# Patient Record
Sex: Male | Born: 1935 | Race: White | Hispanic: No | Smoking: Former smoker
Health system: Southern US, Community
[De-identification: ages and names within clinical notes are randomized; demographics above are authoritative.]

## PROBLEM LIST (undated history)

## (undated) DIAGNOSIS — Z8719 Personal history of other diseases of the digestive system: Secondary | ICD-10-CM

## (undated) DIAGNOSIS — K219 Gastro-esophageal reflux disease without esophagitis: Secondary | ICD-10-CM

## (undated) DIAGNOSIS — W19XXXA Unspecified fall, initial encounter: Secondary | ICD-10-CM

## (undated) DIAGNOSIS — E785 Hyperlipidemia, unspecified: Secondary | ICD-10-CM

## (undated) DIAGNOSIS — F32A Depression, unspecified: Secondary | ICD-10-CM

## (undated) DIAGNOSIS — M1990 Unspecified osteoarthritis, unspecified site: Secondary | ICD-10-CM

## (undated) DIAGNOSIS — S72001A Fracture of unspecified part of neck of right femur, initial encounter for closed fracture: Secondary | ICD-10-CM

## (undated) DIAGNOSIS — R251 Tremor, unspecified: Secondary | ICD-10-CM

## (undated) DIAGNOSIS — F419 Anxiety disorder, unspecified: Secondary | ICD-10-CM

## (undated) DIAGNOSIS — R569 Unspecified convulsions: Secondary | ICD-10-CM

## (undated) DIAGNOSIS — E875 Hyperkalemia: Secondary | ICD-10-CM

## (undated) DIAGNOSIS — I288 Other diseases of pulmonary vessels: Secondary | ICD-10-CM

## (undated) DIAGNOSIS — I2510 Atherosclerotic heart disease of native coronary artery without angina pectoris: Secondary | ICD-10-CM

## (undated) DIAGNOSIS — G8929 Other chronic pain: Secondary | ICD-10-CM

## (undated) DIAGNOSIS — Q268 Other congenital malformations of great veins: Secondary | ICD-10-CM

## (undated) DIAGNOSIS — R0601 Orthopnea: Secondary | ICD-10-CM

## (undated) DIAGNOSIS — D649 Anemia, unspecified: Secondary | ICD-10-CM

## (undated) DIAGNOSIS — M545 Low back pain, unspecified: Secondary | ICD-10-CM

## (undated) DIAGNOSIS — I209 Angina pectoris, unspecified: Secondary | ICD-10-CM

## (undated) DIAGNOSIS — I639 Cerebral infarction, unspecified: Secondary | ICD-10-CM

## (undated) DIAGNOSIS — Z8711 Personal history of peptic ulcer disease: Secondary | ICD-10-CM

## (undated) DIAGNOSIS — F329 Major depressive disorder, single episode, unspecified: Secondary | ICD-10-CM

## (undated) DIAGNOSIS — E119 Type 2 diabetes mellitus without complications: Secondary | ICD-10-CM

## (undated) DIAGNOSIS — I219 Acute myocardial infarction, unspecified: Secondary | ICD-10-CM

## (undated) DIAGNOSIS — I10 Essential (primary) hypertension: Secondary | ICD-10-CM

## (undated) DIAGNOSIS — J690 Pneumonitis due to inhalation of food and vomit: Secondary | ICD-10-CM

## (undated) DIAGNOSIS — N189 Chronic kidney disease, unspecified: Secondary | ICD-10-CM

## (undated) DIAGNOSIS — Y92009 Unspecified place in unspecified non-institutional (private) residence as the place of occurrence of the external cause: Secondary | ICD-10-CM

## (undated) HISTORY — DX: Gastro-esophageal reflux disease without esophagitis: K21.9

## (undated) HISTORY — DX: Atherosclerotic heart disease of native coronary artery without angina pectoris: I25.10

## (undated) HISTORY — DX: Tremor, unspecified: R25.1

## (undated) HISTORY — PX: INGUINAL HERNIA REPAIR: SUR1180

## (undated) HISTORY — DX: Essential (primary) hypertension: I10

## (undated) HISTORY — DX: Other diseases of pulmonary vessels: I28.8

## (undated) HISTORY — DX: Anxiety disorder, unspecified: F41.9

## (undated) HISTORY — DX: Other congenital malformations of great veins: Q26.8

## (undated) HISTORY — DX: Type 2 diabetes mellitus without complications: E11.9

## (undated) HISTORY — PX: CATARACT EXTRACTION W/ INTRAOCULAR LENS  IMPLANT, BILATERAL: SHX1307

## (undated) HISTORY — DX: Hyperlipidemia, unspecified: E78.5

---

## 2000-03-22 ENCOUNTER — Inpatient Hospital Stay (HOSPITAL_COMMUNITY): Admission: EM | Admit: 2000-03-22 | Discharge: 2000-03-25 | Payer: Self-pay | Admitting: Emergency Medicine

## 2000-03-22 ENCOUNTER — Encounter: Payer: Self-pay | Admitting: Emergency Medicine

## 2000-03-22 ENCOUNTER — Encounter: Payer: Self-pay | Admitting: Family Medicine

## 2000-03-23 ENCOUNTER — Encounter: Payer: Self-pay | Admitting: Family Medicine

## 2000-12-14 ENCOUNTER — Encounter: Payer: Self-pay | Admitting: Emergency Medicine

## 2000-12-14 ENCOUNTER — Emergency Department (HOSPITAL_COMMUNITY): Admission: EM | Admit: 2000-12-14 | Discharge: 2000-12-15 | Payer: Self-pay | Admitting: Emergency Medicine

## 2002-01-10 ENCOUNTER — Emergency Department (HOSPITAL_COMMUNITY): Admission: EM | Admit: 2002-01-10 | Discharge: 2002-01-10 | Payer: Self-pay | Admitting: Emergency Medicine

## 2002-01-10 ENCOUNTER — Inpatient Hospital Stay (HOSPITAL_COMMUNITY): Admission: EM | Admit: 2002-01-10 | Discharge: 2002-01-12 | Payer: Self-pay | Admitting: Emergency Medicine

## 2002-01-10 ENCOUNTER — Encounter: Payer: Self-pay | Admitting: Family Medicine

## 2002-09-02 ENCOUNTER — Inpatient Hospital Stay (HOSPITAL_COMMUNITY): Admission: EM | Admit: 2002-09-02 | Discharge: 2002-09-03 | Payer: Self-pay | Admitting: *Deleted

## 2002-09-02 ENCOUNTER — Encounter: Payer: Self-pay | Admitting: Emergency Medicine

## 2007-03-20 ENCOUNTER — Inpatient Hospital Stay (HOSPITAL_COMMUNITY): Admission: EM | Admit: 2007-03-20 | Discharge: 2007-03-22 | Payer: Self-pay | Admitting: Emergency Medicine

## 2008-04-09 ENCOUNTER — Inpatient Hospital Stay (HOSPITAL_COMMUNITY): Admission: EM | Admit: 2008-04-09 | Discharge: 2008-04-17 | Payer: Self-pay | Admitting: Emergency Medicine

## 2008-04-09 ENCOUNTER — Ambulatory Visit: Payer: Self-pay | Admitting: Critical Care Medicine

## 2008-04-09 ENCOUNTER — Ambulatory Visit: Payer: Self-pay | Admitting: Cardiology

## 2008-04-10 ENCOUNTER — Encounter: Payer: Self-pay | Admitting: Critical Care Medicine

## 2008-04-15 ENCOUNTER — Ambulatory Visit: Payer: Self-pay | Admitting: Cardiology

## 2008-04-15 ENCOUNTER — Ambulatory Visit: Payer: Self-pay | Admitting: Thoracic Surgery (Cardiothoracic Vascular Surgery)

## 2008-04-15 HISTORY — PX: CORONARY ARTERY BYPASS GRAFT: SHX141

## 2008-04-16 ENCOUNTER — Encounter: Payer: Self-pay | Admitting: Cardiology

## 2008-04-16 ENCOUNTER — Encounter: Payer: Self-pay | Admitting: Thoracic Surgery (Cardiothoracic Vascular Surgery)

## 2008-04-17 ENCOUNTER — Encounter: Payer: Self-pay | Admitting: Thoracic Surgery (Cardiothoracic Vascular Surgery)

## 2008-04-19 ENCOUNTER — Ambulatory Visit: Payer: Self-pay | Admitting: Cardiovascular Disease

## 2008-04-22 ENCOUNTER — Inpatient Hospital Stay (HOSPITAL_COMMUNITY)
Admission: RE | Admit: 2008-04-22 | Discharge: 2008-04-28 | Payer: Self-pay | Admitting: Thoracic Surgery (Cardiothoracic Vascular Surgery)

## 2008-04-26 ENCOUNTER — Encounter: Payer: Self-pay | Admitting: Thoracic Surgery (Cardiothoracic Vascular Surgery)

## 2008-05-11 DIAGNOSIS — E785 Hyperlipidemia, unspecified: Secondary | ICD-10-CM

## 2008-05-13 DIAGNOSIS — I10 Essential (primary) hypertension: Secondary | ICD-10-CM

## 2008-05-13 DIAGNOSIS — I25810 Atherosclerosis of coronary artery bypass graft(s) without angina pectoris: Secondary | ICD-10-CM | POA: Insufficient documentation

## 2008-05-13 DIAGNOSIS — R569 Unspecified convulsions: Secondary | ICD-10-CM

## 2008-05-13 DIAGNOSIS — E119 Type 2 diabetes mellitus without complications: Secondary | ICD-10-CM

## 2008-05-14 ENCOUNTER — Encounter: Payer: Self-pay | Admitting: Cardiology

## 2008-05-14 ENCOUNTER — Ambulatory Visit: Payer: Self-pay | Admitting: Cardiology

## 2008-05-24 ENCOUNTER — Ambulatory Visit: Payer: Self-pay | Admitting: Thoracic Surgery (Cardiothoracic Vascular Surgery)

## 2008-05-24 ENCOUNTER — Encounter
Admission: RE | Admit: 2008-05-24 | Discharge: 2008-05-24 | Payer: Self-pay | Admitting: Thoracic Surgery (Cardiothoracic Vascular Surgery)

## 2008-06-12 ENCOUNTER — Inpatient Hospital Stay (HOSPITAL_COMMUNITY): Admission: EM | Admit: 2008-06-12 | Discharge: 2008-06-14 | Payer: Self-pay | Admitting: Emergency Medicine

## 2008-07-03 ENCOUNTER — Ambulatory Visit: Payer: Self-pay | Admitting: Cardiology

## 2008-07-03 DIAGNOSIS — I119 Hypertensive heart disease without heart failure: Secondary | ICD-10-CM

## 2008-07-03 DIAGNOSIS — I2510 Atherosclerotic heart disease of native coronary artery without angina pectoris: Secondary | ICD-10-CM | POA: Insufficient documentation

## 2008-07-04 ENCOUNTER — Telehealth: Payer: Self-pay | Admitting: Cardiology

## 2008-07-08 LAB — CONVERTED CEMR LAB
ALT: 14 units/L (ref 0–53)
AST: 18 units/L (ref 0–37)
Albumin: 3.9 g/dL (ref 3.5–5.2)
Alkaline Phosphatase: 76 units/L (ref 39–117)
BUN: 12 mg/dL (ref 6–23)
Basophils Relative: 1.3 % (ref 0.0–3.0)
CO2: 28 meq/L (ref 19–32)
Chloride: 107 meq/L (ref 96–112)
Cholesterol: 161 mg/dL (ref 0–200)
Eosinophils Relative: 4.5 % (ref 0.0–5.0)
GFR calc non Af Amer: 86.74 mL/min (ref >60)
Glucose, Bld: 176 mg/dL — ABNORMAL HIGH (ref 70–99)
HCT: 35.4 % — ABNORMAL LOW (ref 39.0–52.0)
Hemoglobin: 12.1 g/dL — ABNORMAL LOW (ref 13.0–17.0)
Lymphs Abs: 3.1 10*3/uL (ref 0.7–4.0)
MCV: 94.2 fL (ref 78.0–100.0)
Monocytes Absolute: 0.5 10*3/uL (ref 0.1–1.0)
Monocytes Relative: 5.3 % (ref 3.0–12.0)
Potassium: 5.5 meq/L — ABNORMAL HIGH (ref 3.5–5.1)
RBC: 3.76 M/uL — ABNORMAL LOW (ref 4.22–5.81)
TSH: 1.91 microintl units/mL (ref 0.35–5.50)
Total Protein: 7 g/dL (ref 6.0–8.3)
WBC: 8.7 10*3/uL (ref 4.5–10.5)

## 2008-07-11 ENCOUNTER — Emergency Department (HOSPITAL_COMMUNITY): Admission: EM | Admit: 2008-07-11 | Discharge: 2008-07-11 | Payer: Self-pay | Admitting: Emergency Medicine

## 2008-07-11 ENCOUNTER — Ambulatory Visit: Payer: Self-pay | Admitting: Cardiology

## 2008-07-11 DIAGNOSIS — E876 Hypokalemia: Secondary | ICD-10-CM

## 2008-07-11 LAB — CONVERTED CEMR LAB
BUN: 21 mg/dL (ref 6–23)
CO2: 25 meq/L (ref 19–32)
Calcium: 9 mg/dL (ref 8.4–10.5)
GFR calc non Af Amer: 86.74 mL/min (ref >60)
Glucose, Bld: 234 mg/dL — ABNORMAL HIGH (ref 70–99)
Potassium: 6 meq/L (ref 3.5–5.1)
Sodium: 141 meq/L (ref 135–145)

## 2008-07-12 ENCOUNTER — Telehealth: Payer: Self-pay | Admitting: Cardiology

## 2008-08-18 ENCOUNTER — Emergency Department (HOSPITAL_COMMUNITY): Admission: EM | Admit: 2008-08-18 | Discharge: 2008-08-18 | Payer: Self-pay | Admitting: Emergency Medicine

## 2009-05-13 ENCOUNTER — Ambulatory Visit: Payer: Self-pay | Admitting: Cardiology

## 2009-05-13 DIAGNOSIS — R279 Unspecified lack of coordination: Secondary | ICD-10-CM | POA: Insufficient documentation

## 2009-05-13 DIAGNOSIS — R269 Unspecified abnormalities of gait and mobility: Secondary | ICD-10-CM | POA: Insufficient documentation

## 2009-05-13 LAB — CONVERTED CEMR LAB
BUN: 18 mg/dL (ref 6–23)
Basophils Absolute: 0.1 10*3/uL (ref 0.0–0.1)
Bilirubin, Direct: 0 mg/dL (ref 0.0–0.3)
Chloride: 105 meq/L (ref 96–112)
Cholesterol: 161 mg/dL (ref 0–200)
Creatinine, Ser: 1.2 mg/dL (ref 0.4–1.5)
Eosinophils Absolute: 0.5 10*3/uL (ref 0.0–0.7)
Glucose, Bld: 205 mg/dL — ABNORMAL HIGH (ref 70–99)
HCT: 32.5 % — ABNORMAL LOW (ref 39.0–52.0)
HDL: 60.2 mg/dL (ref >39.00)
LDL Cholesterol: 71 mg/dL (ref 0–99)
Lymphs Abs: 2.2 10*3/uL (ref 0.7–4.0)
MCV: 97.7 fL (ref 78.0–100.0)
Monocytes Absolute: 0.5 10*3/uL (ref 0.1–1.0)
Neutrophils Relative %: 64.2 % (ref 43.0–77.0)
Platelets: 251 10*3/uL (ref 150.0–400.0)
Potassium: 5.8 meq/L — ABNORMAL HIGH (ref 3.5–5.1)
RDW: 11.9 % (ref 11.5–14.6)
Total Bilirubin: 0.4 mg/dL (ref 0.3–1.2)
Total CHOL/HDL Ratio: 3
Triglycerides: 150 mg/dL — ABNORMAL HIGH (ref 0.0–149.0)
VLDL: 30 mg/dL (ref 0.0–40.0)
WBC: 8.9 10*3/uL (ref 4.5–10.5)

## 2009-05-15 ENCOUNTER — Telehealth: Payer: Self-pay | Admitting: Cardiology

## 2009-05-23 ENCOUNTER — Ambulatory Visit: Payer: Self-pay | Admitting: Cardiology

## 2009-05-23 ENCOUNTER — Telehealth: Payer: Self-pay | Admitting: Cardiology

## 2009-05-23 LAB — CONVERTED CEMR LAB
BUN: 20 mg/dL (ref 6–23)
CO2: 26 meq/L (ref 19–32)
Calcium: 9.1 mg/dL (ref 8.4–10.5)
Creatinine, Ser: 1.2 mg/dL (ref 0.4–1.5)
Glucose, Bld: 187 mg/dL — ABNORMAL HIGH (ref 70–99)
Sodium: 140 meq/L (ref 135–145)

## 2009-05-26 ENCOUNTER — Ambulatory Visit: Payer: Self-pay | Admitting: Cardiology

## 2009-05-27 ENCOUNTER — Encounter: Payer: Self-pay | Admitting: Cardiology

## 2009-05-27 LAB — CONVERTED CEMR LAB
BUN: 17 mg/dL (ref 6–23)
Calcium: 8.7 mg/dL (ref 8.4–10.5)
Chloride: 103 meq/L (ref 96–112)
Creatinine, Ser: 1.2 mg/dL (ref 0.4–1.5)

## 2009-06-03 ENCOUNTER — Inpatient Hospital Stay (HOSPITAL_COMMUNITY): Admission: EM | Admit: 2009-06-03 | Discharge: 2009-06-05 | Payer: Self-pay | Admitting: Emergency Medicine

## 2009-06-27 ENCOUNTER — Encounter: Admission: RE | Admit: 2009-06-27 | Discharge: 2009-06-27 | Payer: Self-pay | Admitting: Neurology

## 2009-06-28 ENCOUNTER — Observation Stay (HOSPITAL_COMMUNITY): Admission: EM | Admit: 2009-06-28 | Discharge: 2009-06-29 | Payer: Self-pay | Admitting: Emergency Medicine

## 2009-06-28 ENCOUNTER — Ambulatory Visit: Payer: Self-pay | Admitting: Internal Medicine

## 2009-07-04 ENCOUNTER — Encounter: Payer: Self-pay | Admitting: Cardiology

## 2009-07-16 ENCOUNTER — Encounter: Payer: Self-pay | Admitting: Cardiology

## 2010-03-17 NOTE — Progress Notes (Signed)
Summary: Med clarification  Phone Note Outgoing Call   Call placed by: Sherri Rad, RN, BSN,  May 23, 2009 5:38 PM Summary of Call: I called the pt's pharmacy - [facility] on [address]. They do not stock Kayexalate. I was told to contact [facility] pharmacy at [facility]. I called and spoke with Janie- I called in the Kayexalate 50 grams x 1 dose. I called the pt's daughter back and she states that is too far for them to drive since they live in [place]. She did also tell me they had Kionex left over from last year that was [facility] [place]. [facility] [place] to inquire if this is equivalant to Kayexalate. Per the pharmacist, this is the same thing. I explained the pt would need to take 50 grams x 1 dose and I needed to know what that equated to in mL's. Per the pharmacist, the pt will need to take 213mL's to make a 50 gram dose. I have relayed this to the pt's daughter and she verbalizes understanding. I have also called [facility] and cancelled the RX that I have called in there.  Initial call taken by: Sherri Rad, RN, BSN,  May 23, 2009 5:43 PM

## 2010-03-17 NOTE — Assessment & Plan Note (Signed)
Summary: bp check  Nurse Visit   Vital Signs:  Patient profile:   77 year old male Weight:      157.75 pounds Pulse rate:   58 / minute Resp:     20 per minute BP sitting:   132 / 60  (left arm)  Vitals Entered By: Ollen Gross, RN, BSN (May 23, 2009 11:08 AM)  Impression & Recommendations:  Problem # 1:  BEN HTN HEART DISEASE WITHOUT HEART FAIL (ICD-402.10)  His updated medication list for this problem includes:    Amlodipine Besylate 10 Mg Tabs (Amlodipine besylate) .Marland Kitchen... Take one tablet by mouth daily    Aspirin Ec 325 Mg Tbec (Aspirin) .Marland Kitchen... Take one tablet by mouth daily    Carvedilol 25 Mg Tabs (Carvedilol) .Marland Kitchen... Take one tablet by mouth twice a day    Lisinopril 10 Mg Tabs (Lisinopril) .Marland Kitchen... Take one tablet by mouth daily    Hydralazine Hcl 25 Mg Tabs (Hydralazine hcl) .Marland Kitchen... Take one tablet by mouth three times a day Pt. in for B/P check and labs (repeat BMET) B/P left arm 132/60 pulse 58 bpm. right arm 135/61. RR 20 Pt. has no C/O. He took his medications this AM. Pt would like to know if he can take Hydralazine HCT 25mg  one tablet in the morning and two tablets in the evening instead of one tablet three times a day. Patient states he forgets to take the one at noon time. I will send message to  Dr. Juanda Chance . MD's nurse will let you know asap. Okay with pt.    Allergies: No Known Drug Allergies

## 2010-03-17 NOTE — Miscellaneous (Signed)
Summary: med update  Clinical Lists Changes  Medications: Removed medication of LISINOPRIL 10 MG TABS (LISINOPRIL) Take one tablet by mouth daily Changed medication from HYDRALAZINE HCL 25 MG TABS (HYDRALAZINE HCL) Take one tablet by mouth three times a day to HYDRALAZINE HCL 50 MG TABS (HYDRALAZINE HCL) Take one tablet by mouth three times a day - Signed Rx of HYDRALAZINE HCL 50 MG TABS (HYDRALAZINE HCL) Take one tablet by mouth three times a day;  #90 x 6;  Signed;  Entered by: Sherri Rad, RN, BSN;  Authorized by: Lenoria Farrier, MD, [facility];  Method used: Electronically to [facility]  [phone]*, [address] [place], [place], [place]  [address], Ph: [phone] or [phone], Fax: [phone]    Prescriptions: HYDRALAZINE HCL 50 MG TABS (HYDRALAZINE HCL) Take one tablet by mouth three times a day  #90 x 6   Entered by:   Sherri Rad, RN, BSN   Authorized by:   Lenoria Farrier, MD, [facility]   Signed by:   Sherri Rad, RN, BSN on 05/27/2009   Method used:   Electronically to        [facility]  [phone]* (retail)       [address]       [place], [place]  [address]       Ph: [phone] or [phone]       Fax: [phone]   RxID:   [id number]

## 2010-03-17 NOTE — Assessment & Plan Note (Signed)
Summary: 1 YR F/U   Visit Type:  1 yr f/u Primary Provider:  Benedetto Goad  CC:  sob...pt states bilateral leg numbness from the knees down...denies any cp .  History of Present Illness: The patient is 77 years old and return for management of CAD. In March of 2010 he presented with a seizure and chest pain and was found to have three-vessel coronary disease and underwent bypass surgery.  His ejection fraction was 60%.   He has done quite well since that time from the standpoint of his heart and has had no chest pain shortness breath or palpitations.  He was hospitalized later in 2010 with Dilantin toxicity and this dose was reduced.  His other major problem is he has developed trouble with walking over the past 6 months. He now has to use a cane and feels that he is very unsteady.  His other problems include hypertension, hyperlipidemia, and diabetes.  Current Medications (verified): 1)  Amlodipine Besylate 10 Mg Tabs (Amlodipine Besylate) .... Take One Tablet By Mouth Daily 2)  Aspirin Ec 325 Mg Tbec (Aspirin) .... Take One Tablet By Mouth Daily 3)  Carvedilol 25 Mg Tabs (Carvedilol) .... Take One Tablet By Mouth Twice A Day 4)  Lisinopril 40 Mg Tabs (Lisinopril) .... Take One Half Tablet By Mouth Daily 5)  Simvastatin 40 Mg Tabs (Simvastatin) .... Take One Tablet By Mouth Daily At Bedtime 6)  Diazepam 10 Mg Tabs (Diazepam) .... Three Times A Day As Needed 7)  Epitol 200 Mg Tabs (Carbamazepine) .... 2 Two Times A Day 8)  Glyburide 5 Mg  Tabs (Glyburide) .... 2  Tablet By Mouth Two Times A Day 9)  Metformin Hcl 1000 Mg Tabs (Metformin Hcl) .... Take 1 Tablet By Mouth Two Times A Day 10)  Pantoprazole Sodium 40 Mg Tbec (Pantoprazole Sodium) .... Once Daily 11)  Dilantin 100 Mg Caps (Phenytoin Sodium Extended) .Marland Kitchen.. 1 Tab Three Times A Day 12)  Vitamin B-12 1000 Mcg Tabs (Cyanocobalamin) .Marland Kitchen.. 1 Tab Once Daily  Allergies (verified): No Known Drug Allergies  Past History:  Past  Medical History: Reviewed history from 05/14/2008 and no changes required. CAD S/P CABG 3/10 1.  Seizure disorder.   2. Diabetes mellitus type 2.   3. Hypertension.   4. Hyperlipidemia.   5. Coronary artery disease status post MI at age 74.   50. Anxiety disorder.   7. Tremor   8. GERD.       Pulmonary Vein Stenosis  Review of Systems       ROS is negative except as outlined in HPI.   Vital Signs:  Patient profile:   77 year old male Height:      61 inches Weight:      160 pounds BMI:     30.34 Pulse rate:   62 / minute Pulse rhythm:   irregular BP sitting:   140 / 70  (left arm) Cuff size:   regular  Vitals Entered By: Danielle Rankin, CMA (May 13, 2009 10:09 AM)  Physical Exam  Additional Exam:  Gen. Well-nourished, in no distress   Neck: No JVD, thyroid not enlarged, no carotid bruits Lungs: No tachypnea, clear without rales, rhonchi or wheezes Cardiovascular: Rhythm regular, PMI not displaced,  heart sounds  normal, no murmurs or gallops, no peripheral edema, pulses normal in all 4 extremities. Abdomen: BS normal, abdomen soft and non-tender without masses or organomegaly, no hepatosplenomegaly. MS: No deformities, no cyanosis or clubbing   Neuro:  he  has a wide-based gait Skin:  no lesions    Impression & Recommendations:  Problem # 1:  CORONARY ATHEROSCLEROSIS NATIVE CORONARY ARTERY (ICD-414.01)  He had bypass surgery in March of 2010. He's had no recent chest pain this problem appears stable. We will continue current medications. His updated medication list for this problem includes:    Amlodipine Besylate 10 Mg Tabs (Amlodipine besylate) .Marland Kitchen... Take one tablet by mouth daily    Aspirin Ec 325 Mg Tbec (Aspirin) .Marland Kitchen... Take one tablet by mouth daily    Carvedilol 25 Mg Tabs (Carvedilol) .Marland Kitchen... Take one tablet by mouth twice a day    Amlodipine Besylate 5 Mg Tabs (Amlodipine besylate) .Marland Kitchen... Take one tablet by mouth daily  His updated medication list for this  problem includes:    Amlodipine Besylate 10 Mg Tabs (Amlodipine besylate) .Marland Kitchen... Take one tablet by mouth daily    Aspirin Ec 325 Mg Tbec (Aspirin) .Marland Kitchen... Take one tablet by mouth daily    Carvedilol 25 Mg Tabs (Carvedilol) .Marland Kitchen... Take one tablet by mouth twice a day    Lisinopril 40 Mg Tabs (Lisinopril) .Marland Kitchen... Take one half tablet by mouth daily  Problem # 2:  HYPERTENSION, BENIGN (ICD-401.1)  This appears well controlled on current medications. His updated medication list for this problem includes:    Amlodipine Besylate 10 Mg Tabs (Amlodipine besylate) .Marland Kitchen... Take one tablet by mouth daily    Aspirin Ec 325 Mg Tbec (Aspirin) .Marland Kitchen... Take one tablet by mouth daily    Carvedilol 25 Mg Tabs (Carvedilol) .Marland Kitchen... Take one tablet by mouth twice a day    Amlodipine Besylate 5 Mg Tabs (Amlodipine besylate) .Marland Kitchen... Take one tablet by mouth daily  Orders: EKG w/ Interpretation (93000) TLB-BMP (Basic Metabolic Panel-BMET) (80048-METABOL) TLB-CBC Platelet - w/Differential (85025-CBCD) TLB-Hepatic/Liver Function Pnl (80076-HEPATIC) TLB-Lipid Panel (80061-LIPID) TLB-A1C / Hgb A1C (Glycohemoglobin) (83036-A1C)  His updated medication list for this problem includes:    Amlodipine Besylate 10 Mg Tabs (Amlodipine besylate) .Marland Kitchen... Take one tablet by mouth daily    Aspirin Ec 325 Mg Tbec (Aspirin) .Marland Kitchen... Take one tablet by mouth daily    Carvedilol 25 Mg Tabs (Carvedilol) .Marland Kitchen... Take one tablet by mouth twice a day    Lisinopril 40 Mg Tabs (Lisinopril) .Marland Kitchen... Take one half tablet by mouth daily  Problem # 3:  HYPERLIPIDEMIA-MIXED (ICD-272.4) He is currently being treated with simvastatin. We will get a lipid profile today. His updated medication list for this problem includes:    Simvastatin 40 Mg Tabs (Simvastatin) .Marland Kitchen... Take one tablet by mouth daily at bedtime  Orders: EKG w/ Interpretation (93000) TLB-BMP (Basic Metabolic Panel-BMET) (80048-METABOL) TLB-CBC Platelet - w/Differential  (85025-CBCD) TLB-Hepatic/Liver Function Pnl (80076-HEPATIC) TLB-Lipid Panel (80061-LIPID) TLB-A1C / Hgb A1C (Glycohemoglobin) (83036-A1C)  His updated medication list for this problem includes:    Simvastatin 40 Mg Tabs (Simvastatin) .Marland Kitchen... Take one tablet by mouth daily at bedtime  Problem # 4:  ATAXIA (ICD-781.3) He has developed progressive ataxia over the past 6 months with difficulty walking and has to use a cane. He also has a seizure disorder. We will check a Dilantin level. We will make a referral to neurology for further evaluation of the ataxia.  Other Orders: Neurology Referral (Neuro) TLB-Phenytoin (Dilantin) (80185-DILAN)  Patient Instructions: 1)  Your physician recommends that you have lab work today: lipid/liver/cbc/bmp/dilantin level/HgbA1c (414.01;272.2;402.10) 2)  Your physician wants you to follow-up in: 1 year with Dr. Clifton James.  You will receive a reminder letter in the mail two months  in advance. If you don't receive a letter, please call our office to schedule the follow-up appointment. 3)  You will be referred to Neurology. Prescriptions: AMLODIPINE BESYLATE 5 MG TABS (AMLODIPINE BESYLATE) Take one tablet by mouth daily  #30 x 6   Entered by:   Sherri Rad, RN, BSN   Authorized by:   Lenoria Farrier, MD, [facility]   Signed by:   Sherri Rad, RN, BSN on 05/13/2009   Method used:   Electronically to        [facility]  [phone]* (retail)       [address]       [place], [place]  [address]       Ph: [phone] or [phone]       Fax: [phone]   RxID:   [id number]

## 2010-03-17 NOTE — Progress Notes (Signed)
Summary: questions about new meds  Phone Note Call from Patient Call back at Home Phone [phone]   Caller: PT Reason for Call: Talk to Nurse Summary of Call: pt wants to go over new meds with nurse Initial call taken by: Faythe Ghee,  May 15, 2009 4:01 PM  Follow-up for Phone Call        Spoke with pt's daughter Dois Davenport. She states pt's Lisinopril was D/C and put on Norvasc 5 mg daily on 05/13/09 due to high potassium. Pt's daughter states pt.has been taken Norvasc 10mg  daily and she has taken the 10 mg pill today. She would like to know Does pt. needs to be on Norvasc 5mg  or 10 mg.? I let daughter know Dr. Juanda Chance and nurse will be in office tomorrow. I will send this message to their descktop. Dois Davenport would like to be call to her Cell phone # [phone]. Ollen Gross, RN, BSN  May 15, 2009 4:48 PM   Additional Follow-up for Phone Call Additional follow up Details #1::        I discussed the above with Dr. Juanda Chance. Orders received to have the pt resume amlodipine at 10mg  once daily , restart lisinopril at 10mg  once daily, and start apresoline 25mg  three times a day. I have spoken with the pt's daughter, Dois Davenport, and she verbalizes understanding of these orders. The pt will come next friday 05/23/09 for BP check and repeat bmet.  Additional Follow-up by: Sherri Rad, RN, BSN,  May 16, 2009 6:50 PM    New/Updated Medications: LISINOPRIL 10 MG TABS (LISINOPRIL) Take one tablet by mouth daily HYDRALAZINE HCL 25 MG TABS (HYDRALAZINE HCL) Take one tablet by mouth three times a day Prescriptions: HYDRALAZINE HCL 25 MG TABS (HYDRALAZINE HCL) Take one tablet by mouth three times a day  #90 x 6   Entered by:   Sherri Rad, RN, BSN   Authorized by:   Lenoria Farrier, MD, [facility]   Signed by:   Sherri Rad, RN, BSN on 05/16/2009   Method used:   Electronically to        [facility]  [phone]* (retail)       [address]       [place], [place]  [address]       Ph: [phone] or [phone]       Fax: [phone]   RxID:   [id number] LISINOPRIL 10 MG TABS (LISINOPRIL) Take one tablet by mouth daily  #30 x 6   Entered by:   Sherri Rad, RN, BSN   Authorized by:   Lenoria Farrier, MD, [facility]   Signed by:   Sherri Rad, RN, BSN on 05/16/2009   Method used:   Electronically to        [facility]  [phone]* (retail)       [address]       [place], [place]  [address]       Ph: [phone] or [phone]       Fax: [phone]   RxID:   [id number]

## 2010-03-17 NOTE — Letter (Signed)
Summary: [facility]   [facility]   Imported By: Roderic Ovens 07/25/2009 11:52:48  _____________________________________________________________________  External Attachment:    Type:   Image     Comment:   External Document

## 2010-03-20 NOTE — Letter (Signed)
Summary: [facility] Office Notes  [facility] Office Notes   Imported By: Roderic Ovens 08/07/2009 09:37:41  _____________________________________________________________________  External Attachment:    Type:   Image     Comment:   External Document

## 2010-04-15 ENCOUNTER — Telehealth: Payer: Self-pay | Admitting: Cardiovascular Disease

## 2010-04-23 NOTE — Progress Notes (Signed)
Summary: chest discomfort  Phone Note Outgoing Call   Call placed by: Whitney Maeola Sarah RN,  April 15, 2010 9:33 AM Call placed to: Patient Initial call taken by: Ellender Hose RN,  April 15, 2010 9:33 AM  Follow-up for Phone Call        Patient had CABG in 2010 and is c/o chest soreness on both sides of his chest over his scar. This just started about a week ago. He does not c/o any other chest pain or dyspnea. I explained to him that this is probably just muscle soreness and to try applying heat to his chest. He is a former Psychologist, counselling patient and will f/u with Dr. Clifton James 05/13/10. I advised him to call us back if this continues or if he has any CP/SOB. He agrees. Will forward to Dr. Clifton James for review. Whitney Maeola Sarah RN  April 15, 2010 9:38 AM  Follow-up by: Whitney Maeola Sarah RN,  April 15, 2010 9:38 AM  Additional Follow-up for Phone Call Additional follow up Details #1::        Agree. cdm Additional Follow-up by: Verne Carrow, MD,  April 15, 2010 2:17 PM

## 2010-04-29 ENCOUNTER — Encounter: Payer: Self-pay | Admitting: Cardiovascular Disease

## 2010-05-04 LAB — BASIC METABOLIC PANEL
CO2: 20 mEq/L (ref 19–32)
Calcium: 8.3 mg/dL — ABNORMAL LOW (ref 8.4–10.5)
GFR calc Af Amer: >60 mL/min (ref >60)
GFR calc non Af Amer: 54 mL/min — ABNORMAL LOW (ref >60)
Potassium: 4.8 mEq/L (ref 3.5–5.1)
Sodium: 134 mEq/L — ABNORMAL LOW (ref 135–145)

## 2010-05-04 LAB — CARDIAC PANEL(CRET KIN+CKTOT+MB+TROPI)
Relative Index: INVALID (ref 0.0–2.5)
Troponin I: 0.02 ng/mL (ref 0.00–0.06)
Troponin I: 0.02 ng/mL (ref 0.00–0.06)

## 2010-05-04 LAB — GLUCOSE, CAPILLARY: Glucose-Capillary: 88 mg/dL (ref 70–99)

## 2010-05-05 LAB — GLUCOSE, CAPILLARY
Glucose-Capillary: 174 mg/dL — ABNORMAL HIGH (ref 70–99)
Glucose-Capillary: 266 mg/dL — ABNORMAL HIGH (ref 70–99)
Glucose-Capillary: 291 mg/dL — ABNORMAL HIGH (ref 70–99)

## 2010-05-05 LAB — CBC
HCT: 34.1 % — ABNORMAL LOW (ref 39.0–52.0)
HCT: 28.2 % — ABNORMAL LOW (ref 39.0–52.0)
Hemoglobin: 9.8 g/dL — ABNORMAL LOW (ref 13.0–17.0)
MCHC: 34.9 g/dL (ref 30.0–36.0)
MCHC: 34.9 g/dL (ref 30.0–36.0)
MCV: 94 fL (ref 78.0–100.0)
MCV: 93.2 fL (ref 78.0–100.0)
MCV: 95.2 fL (ref 78.0–100.0)
Platelets: 231 10*3/uL (ref 150–400)
Platelets: 276 10*3/uL (ref 150–400)
Platelets: 357 10*3/uL (ref 150–400)
Platelets: 366 10*3/uL (ref 150–400)
RBC: 3.49 MIL/uL — ABNORMAL LOW (ref 4.22–5.81)
RDW: 13 % (ref 11.5–15.5)
RDW: 12.3 % (ref 11.5–15.5)
RDW: 12.4 % (ref 11.5–15.5)
WBC: 11.4 10*3/uL — ABNORMAL HIGH (ref 4.0–10.5)
WBC: 11.7 10*3/uL — ABNORMAL HIGH (ref 4.0–10.5)

## 2010-05-05 LAB — DIFFERENTIAL
Basophils Absolute: 0.1 10*3/uL (ref 0.0–0.1)
Basophils Relative: 0 % (ref 0–1)
Eosinophils Absolute: 0.1 10*3/uL (ref 0.0–0.7)
Lymphocytes Relative: 36 % (ref 12–46)
Monocytes Absolute: 0.4 10*3/uL (ref 0.1–1.0)
Monocytes Relative: 6 % (ref 3–12)
Neutro Abs: 4 10*3/uL (ref 1.7–7.7)
Neutrophils Relative %: 81 % — ABNORMAL HIGH (ref 43–77)

## 2010-05-05 LAB — URINE CULTURE
Colony Count: NO GROWTH
Culture: NO GROWTH

## 2010-05-05 LAB — BASIC METABOLIC PANEL
BUN: 18 mg/dL (ref 6–23)
CO2: 25 mEq/L (ref 19–32)
Chloride: 105 mEq/L (ref 96–112)
GFR calc non Af Amer: >60 mL/min (ref >60)
Glucose, Bld: 205 mg/dL — ABNORMAL HIGH (ref 70–99)
Potassium: 4.1 mEq/L (ref 3.5–5.1)
Sodium: 136 mEq/L (ref 135–145)

## 2010-05-05 LAB — CK TOTAL AND CKMB (NOT AT ARMC)
CK, MB: 2 ng/mL (ref 0.3–4.0)
Relative Index: INVALID (ref 0.0–2.5)
Relative Index: INVALID (ref 0.0–2.5)
Total CK: 38 U/L (ref 7–232)
Total CK: 78 U/L (ref 7–232)

## 2010-05-05 LAB — LIPID PANEL
HDL: 54 mg/dL (ref >39)
Total CHOL/HDL Ratio: 3 RATIO
Triglycerides: 131 mg/dL (ref <150)

## 2010-05-05 LAB — CARDIAC PANEL(CRET KIN+CKTOT+MB+TROPI)
CK, MB: 0.8 ng/mL (ref 0.3–4.0)
CK, MB: 2.5 ng/mL (ref 0.3–4.0)
Relative Index: INVALID (ref 0.0–2.5)
Relative Index: INVALID (ref 0.0–2.5)
Total CK: 69 U/L (ref 7–232)
Troponin I: 0.33 ng/mL — ABNORMAL HIGH (ref 0.00–0.06)

## 2010-05-05 LAB — COMPREHENSIVE METABOLIC PANEL
AST: 56 U/L — ABNORMAL HIGH (ref 0–37)
Albumin: 3.8 g/dL (ref 3.5–5.2)
Albumin: 3.2 g/dL — ABNORMAL LOW (ref 3.5–5.2)
BUN: 47 mg/dL — ABNORMAL HIGH (ref 6–23)
BUN: 5 mg/dL — ABNORMAL LOW (ref 6–23)
CO2: 24 mEq/L (ref 19–32)
Calcium: 8.8 mg/dL (ref 8.4–10.5)
Chloride: 108 mEq/L (ref 96–112)
Creatinine, Ser: 1.74 mg/dL — ABNORMAL HIGH (ref 0.4–1.5)
Creatinine, Ser: 1.16 mg/dL (ref 0.4–1.5)
GFR calc Af Amer: 46 mL/min — ABNORMAL LOW (ref >60)
GFR calc Af Amer: >60 mL/min (ref >60)
GFR calc non Af Amer: >60 mL/min (ref >60)
Potassium: 5.2 mEq/L — ABNORMAL HIGH (ref 3.5–5.1)
Total Bilirubin: 0.2 mg/dL — ABNORMAL LOW (ref 0.3–1.2)
Total Protein: 6.9 g/dL (ref 6.0–8.3)

## 2010-05-05 LAB — URINE MICROSCOPIC-ADD ON

## 2010-05-05 LAB — POCT I-STAT, CHEM 8
Calcium, Ion: 1.13 mmol/L (ref 1.12–1.32)
Chloride: 105 mEq/L (ref 96–112)
HCT: 33 % — ABNORMAL LOW (ref 39.0–52.0)
Potassium: 4.5 mEq/L (ref 3.5–5.1)

## 2010-05-05 LAB — TSH: TSH: 1.83 u[IU]/mL (ref 0.350–4.500)

## 2010-05-05 LAB — LEGIONELLA ANTIGEN, URINE

## 2010-05-05 LAB — TROPONIN I: Troponin I: 0.33 ng/mL — ABNORMAL HIGH (ref 0.00–0.06)

## 2010-05-05 LAB — PHENYTOIN LEVEL, TOTAL: Phenytoin Lvl: 7.5 ug/mL — ABNORMAL LOW (ref 10.0–20.0)

## 2010-05-05 LAB — CARBAMAZEPINE LEVEL, TOTAL
Carbamazepine Lvl: 6.7 ug/mL (ref 4.0–12.0)
Carbamazepine Lvl: 3.4 ug/mL — ABNORMAL LOW (ref 4.0–12.0)

## 2010-05-05 LAB — URINALYSIS, ROUTINE W REFLEX MICROSCOPIC
Bilirubin Urine: NEGATIVE
Nitrite: NEGATIVE
Specific Gravity, Urine: 1.012 (ref 1.005–1.030)
Urobilinogen, UA: 0.2 mg/dL (ref 0.0–1.0)

## 2010-05-05 LAB — CULTURE, BLOOD (ROUTINE X 2): Culture: NO GROWTH

## 2010-05-05 LAB — PROTIME-INR: INR: 1.07 (ref 0.00–1.49)

## 2010-05-13 ENCOUNTER — Encounter: Payer: Self-pay | Admitting: Cardiovascular Disease

## 2010-05-13 ENCOUNTER — Ambulatory Visit (INDEPENDENT_AMBULATORY_CARE_PROVIDER_SITE_OTHER): Payer: Medicare Other | Admitting: Cardiovascular Disease

## 2010-05-13 DIAGNOSIS — R0989 Other specified symptoms and signs involving the circulatory and respiratory systems: Secondary | ICD-10-CM

## 2010-05-13 DIAGNOSIS — I10 Essential (primary) hypertension: Secondary | ICD-10-CM

## 2010-05-13 DIAGNOSIS — R0600 Dyspnea, unspecified: Secondary | ICD-10-CM

## 2010-05-13 DIAGNOSIS — I2510 Atherosclerotic heart disease of native coronary artery without angina pectoris: Secondary | ICD-10-CM

## 2010-05-13 NOTE — Assessment & Plan Note (Signed)
Stable. His chest pain that he describes is aytpical, non-exertional and most likely musculoskeletal, related to his sternal wound. No changes in therapy.

## 2010-05-13 NOTE — Assessment & Plan Note (Signed)
BP is not at goal. He is on multiple medications. I will have him begin checking his BP at home daily and record a diary. He will let us know what it is running at home and we will make adjustments in therapy.

## 2010-05-13 NOTE — Patient Instructions (Signed)
Your physician recommends that you schedule a follow-up appointment in: 6 months with Dr. McAlhany  Your physician has requested that you have an echocardiogram. Echocardiography is a painless test that uses sound waves to create images of your heart. It provides your doctor with information about the size and shape of your heart and how well your heart's chambers and valves are working. This procedure takes approximately one hour. There are no restrictions for this procedure.   

## 2010-05-13 NOTE — Progress Notes (Signed)
History of Present Illness:77 yo WM with history of CAD s/p 3V March 2010 (RIMA to RCA, SVG to Diagonal, SVG to OM1)  , HTN, hyperlipidemia, DM here today for cardiac follow up. He has been folllowed in the past by Dr. Juanda Chance.  In March of 2010 he presented with a seizure and chest pain and was found to have three-vessel coronary disease and underwent bypass surgery.  His ejection fraction was 60%. He was hospitalized later in 2010 with Dilantin toxicity and this dose was reduced.   He has been doing well. He does describe shortness of breath with exertion. No exertional chest pain or pressure. He also denies any dizziness, near syncope or syncope.    Past Medical History  Diagnosis Date  . CAD (coronary artery disease)     s/p cabg 3/10...s/p MI age 55  . Seizure disorder   . Diabetes mellitus type II   . HTN (hypertension)   . HLD (hyperlipidemia)   . Anxiety disorder   . Tremor   . GERD (gastroesophageal reflux disease)   . Pulmonary vein stenosis     Past Surgical History  Procedure Date  . Coronary artery bypass graft 3/10    Current Outpatient Prescriptions  Medication Sig Dispense Refill  . amLODipine (NORVASC) 10 MG tablet Take 10 mg by mouth daily.        Marland Kitchen aspirin 325 MG tablet Take 325 mg by mouth daily.        . carvedilol (COREG) 25 MG tablet Take 25 mg by mouth 2 (two) times daily with a meal.        . diazepam (VALIUM) 10 MG tablet Take 10 mg by mouth 3 (three) times daily as needed.        . glyBURIDE (DIABETA) 5 MG tablet Take 10 mg by mouth 2 (two) times daily with a meal.        . hydrALAZINE (APRESOLINE) 50 MG tablet Take 50 mg by mouth 3 (three) times daily.        . metFORMIN (GLUCOPHAGE) 1000 MG tablet Take 1,000 mg by mouth 2 (two) times daily with a meal.        . pantoprazole (PROTONIX) 40 MG tablet Take 40 mg by mouth daily.        . phenytoin (DILANTIN) 100 MG ER capsule Take 100 mg by mouth 3 (three) times daily.        . simvastatin (ZOCOR) 40 MG  tablet Take 40 mg by mouth at bedtime.        . vitamin B-12 (CYANOCOBALAMIN) 1000 MCG tablet Take 1,000 mcg by mouth daily.        Marland Kitchen DISCONTD: carbamazepine (TEGRETOL) 200 MG tablet Take 200 mg by mouth 2 (two) times daily.          Allergies not on file  History   Social History  . Marital Status: Married    Spouse Name: N/A    Number of Children: N/A  . Years of Education: N/A   Occupational History  . retired     Engineer, maintenance   Social History Main Topics  . Smoking status: Never Smoker   . Smokeless tobacco: Former Neurosurgeon    Types: Chew  . Alcohol Use: 0.0 oz/week     remote alcohol history but has not drank any for many years.  . Drug Use: Not on file  . Sexually Active: Not on file   Other Topics Concern  . Not on  file   Social History Narrative  . No narrative on file    Family History  Problem Relation Age of Onset  . Coronary artery disease    . Hypertension      Review of Systems:  No chest pain, SOB, palpitations, dizziness,  near syncope or syncope.  No PND, orthopnea, or Lower extremity edema.   BP 154/60  Pulse 57  Resp 18  Ht 5\' 11"  (1.803 m)  Wt 163 lb (73.936 kg)  BMI 22.73 kg/m2  Physical Examination: General: Well developed, well nourished, NAD HEENT: OP clear, mucus membranes moist SKIN: warm, dry. No rashes. Neuro: No focal deficits Musculoskeletal: Muscle strength 5/5 all ext Psychiatric: Mood and affect normal Neck: No JVD, no carotid bruits, no thyromegaly, no lymphadenopathy. Lungs:Clear bilaterally, no wheezes, rhonci, crackles Cardiovascular: Regular rate and rhythm. No murmurs, gallops or rubs. Abdomen:Soft. Bowel sounds present. Non-tender.  Extremities: No lower extremity edema. Pulses are 2 + in the bilateral DP/PT.  ZOX:WRUEA brady, rate 57 bpm. 1st degree AV block. Incomplete RBBB.

## 2010-05-13 NOTE — Assessment & Plan Note (Signed)
Will arrange echo to assess LV function.

## 2010-05-20 ENCOUNTER — Other Ambulatory Visit (HOSPITAL_COMMUNITY): Payer: Medicare Other | Admitting: Radiology

## 2010-05-24 LAB — DIFFERENTIAL
Basophils Relative: 0 % (ref 0–1)
Eosinophils Absolute: 0.5 10*3/uL (ref 0.0–0.7)
Eosinophils Relative: 5 % (ref 0–5)
Lymphs Abs: 2.5 10*3/uL (ref 0.7–4.0)

## 2010-05-24 LAB — URINALYSIS, ROUTINE W REFLEX MICROSCOPIC
Glucose, UA: NEGATIVE mg/dL
Hgb urine dipstick: NEGATIVE
Ketones, ur: NEGATIVE mg/dL
Protein, ur: NEGATIVE mg/dL
Urobilinogen, UA: 0.2 mg/dL (ref 0.0–1.0)

## 2010-05-24 LAB — URINE CULTURE: Culture: NO GROWTH

## 2010-05-24 LAB — CBC
HCT: 34.5 % — ABNORMAL LOW (ref 39.0–52.0)
MCHC: 33.7 g/dL (ref 30.0–36.0)
MCV: 96.6 fL (ref 78.0–100.0)
Platelets: 209 10*3/uL (ref 150–400)
WBC: 8.9 10*3/uL (ref 4.0–10.5)

## 2010-05-24 LAB — POCT CARDIAC MARKERS: Myoglobin, poc: 186 ng/mL (ref 12–200)

## 2010-05-24 LAB — BASIC METABOLIC PANEL
CO2: 21 mEq/L (ref 19–32)
Calcium: 8.7 mg/dL (ref 8.4–10.5)
GFR calc Af Amer: >60 mL/min (ref >60)
GFR calc non Af Amer: >60 mL/min (ref >60)
Glucose, Bld: 171 mg/dL — ABNORMAL HIGH (ref 70–99)
Potassium: 6.6 mEq/L (ref 3.5–5.1)
Sodium: 137 mEq/L (ref 135–145)

## 2010-05-24 LAB — POCT I-STAT, CHEM 8
Chloride: 115 mEq/L — ABNORMAL HIGH (ref 96–112)
HCT: 35 % — ABNORMAL LOW (ref 39.0–52.0)
Potassium: 6.5 mEq/L (ref 3.5–5.1)

## 2010-05-26 LAB — COMPREHENSIVE METABOLIC PANEL
ALT: 13 U/L (ref 0–53)
Albumin: 3.7 g/dL (ref 3.5–5.2)
Alkaline Phosphatase: 75 U/L (ref 39–117)
BUN: 19 mg/dL (ref 6–23)
Chloride: 109 mEq/L (ref 96–112)
Potassium: 4.9 mEq/L (ref 3.5–5.1)
Sodium: 140 mEq/L (ref 135–145)
Total Bilirubin: 0.9 mg/dL (ref 0.3–1.2)
Total Protein: 6.9 g/dL (ref 6.0–8.3)

## 2010-05-27 LAB — DIFFERENTIAL
Basophils Absolute: 0 10*3/uL (ref 0.0–0.1)
Eosinophils Absolute: 0.3 10*3/uL (ref 0.0–0.7)
Lymphocytes Relative: 27 % (ref 12–46)
Lymphocytes Relative: 39 % (ref 12–46)
Lymphs Abs: 2.6 10*3/uL (ref 0.7–4.0)
Monocytes Relative: 7 % (ref 3–12)
Neutro Abs: 3.1 10*3/uL (ref 1.7–7.7)
Neutro Abs: 6.2 10*3/uL (ref 1.7–7.7)
Neutrophils Relative %: 50 % (ref 43–77)
Neutrophils Relative %: 63 % (ref 43–77)

## 2010-05-27 LAB — GLUCOSE, CAPILLARY
Glucose-Capillary: 108 mg/dL — ABNORMAL HIGH (ref 70–99)
Glucose-Capillary: 132 mg/dL — ABNORMAL HIGH (ref 70–99)
Glucose-Capillary: 146 mg/dL — ABNORMAL HIGH (ref 70–99)
Glucose-Capillary: 183 mg/dL — ABNORMAL HIGH (ref 70–99)
Glucose-Capillary: 226 mg/dL — ABNORMAL HIGH (ref 70–99)

## 2010-05-27 LAB — URINALYSIS, ROUTINE W REFLEX MICROSCOPIC
Hgb urine dipstick: NEGATIVE
Nitrite: NEGATIVE
Specific Gravity, Urine: 1.027 (ref 1.005–1.030)
Urobilinogen, UA: 0.2 mg/dL (ref 0.0–1.0)
pH: 5 (ref 5.0–8.0)

## 2010-05-27 LAB — COMPREHENSIVE METABOLIC PANEL
ALT: 16 U/L (ref 0–53)
BUN: 15 mg/dL (ref 6–23)
CO2: 22 mEq/L (ref 19–32)
Calcium: 8.8 mg/dL (ref 8.4–10.5)
Creatinine, Ser: 1 mg/dL (ref 0.4–1.5)
GFR calc non Af Amer: >60 mL/min (ref >60)
Glucose, Bld: 243 mg/dL — ABNORMAL HIGH (ref 70–99)
Total Protein: 6.9 g/dL (ref 6.0–8.3)

## 2010-05-27 LAB — CBC
MCHC: 34.3 g/dL (ref 30.0–36.0)
MCV: 93.3 fL (ref 78.0–100.0)
Platelets: 237 10*3/uL (ref 150–400)
Platelets: 336 10*3/uL (ref 150–400)
RBC: 3.58 MIL/uL — ABNORMAL LOW (ref 4.22–5.81)
RDW: 13.4 % (ref 11.5–15.5)
RDW: 13.8 % (ref 11.5–15.5)
WBC: 6.3 10*3/uL (ref 4.0–10.5)

## 2010-05-27 LAB — URINE MICROSCOPIC-ADD ON

## 2010-05-27 LAB — CARBAMAZEPINE LEVEL, TOTAL: Carbamazepine Lvl: 5.2 ug/mL (ref 4.0–12.0)

## 2010-05-27 LAB — PROTIME-INR
INR: 1.1 (ref 0.00–1.49)
Prothrombin Time: 14 seconds (ref 11.6–15.2)

## 2010-05-27 LAB — CK TOTAL AND CKMB (NOT AT ARMC)
Relative Index: INVALID (ref 0.0–2.5)
Total CK: 36 U/L (ref 7–232)

## 2010-05-27 LAB — URINE CULTURE

## 2010-05-27 LAB — APTT: aPTT: 29 seconds (ref 24–37)

## 2010-05-27 LAB — PHENYTOIN LEVEL, TOTAL
Phenytoin Lvl: 18.5 ug/mL (ref 10.0–20.0)
Phenytoin Lvl: 23.9 ug/mL — ABNORMAL HIGH (ref 10.0–20.0)

## 2010-05-28 LAB — GLUCOSE, CAPILLARY
Glucose-Capillary: 119 mg/dL — ABNORMAL HIGH (ref 70–99)
Glucose-Capillary: 121 mg/dL — ABNORMAL HIGH (ref 70–99)
Glucose-Capillary: 123 mg/dL — ABNORMAL HIGH (ref 70–99)
Glucose-Capillary: 126 mg/dL — ABNORMAL HIGH (ref 70–99)
Glucose-Capillary: 144 mg/dL — ABNORMAL HIGH (ref 70–99)
Glucose-Capillary: 146 mg/dL — ABNORMAL HIGH (ref 70–99)
Glucose-Capillary: 156 mg/dL — ABNORMAL HIGH (ref 70–99)
Glucose-Capillary: 159 mg/dL — ABNORMAL HIGH (ref 70–99)
Glucose-Capillary: 159 mg/dL — ABNORMAL HIGH (ref 70–99)
Glucose-Capillary: 177 mg/dL — ABNORMAL HIGH (ref 70–99)
Glucose-Capillary: 177 mg/dL — ABNORMAL HIGH (ref 70–99)
Glucose-Capillary: 179 mg/dL — ABNORMAL HIGH (ref 70–99)
Glucose-Capillary: 183 mg/dL — ABNORMAL HIGH (ref 70–99)
Glucose-Capillary: 189 mg/dL — ABNORMAL HIGH (ref 70–99)
Glucose-Capillary: 213 mg/dL — ABNORMAL HIGH (ref 70–99)
Glucose-Capillary: 219 mg/dL — ABNORMAL HIGH (ref 70–99)
Glucose-Capillary: 245 mg/dL — ABNORMAL HIGH (ref 70–99)
Glucose-Capillary: 267 mg/dL — ABNORMAL HIGH (ref 70–99)
Glucose-Capillary: 77 mg/dL (ref 70–99)
Glucose-Capillary: 83 mg/dL (ref 70–99)
Glucose-Capillary: 214 mg/dL — ABNORMAL HIGH (ref 70–99)

## 2010-05-28 LAB — URINALYSIS, ROUTINE W REFLEX MICROSCOPIC
Leukocytes, UA: NEGATIVE
Nitrite: NEGATIVE
Specific Gravity, Urine: 1.036 — ABNORMAL HIGH (ref 1.005–1.030)
Urobilinogen, UA: 1 mg/dL (ref 0.0–1.0)
pH: 5 (ref 5.0–8.0)

## 2010-05-28 LAB — BASIC METABOLIC PANEL
CO2: 22 mEq/L (ref 19–32)
CO2: 24 mEq/L (ref 19–32)
Calcium: 7.9 mg/dL — ABNORMAL LOW (ref 8.4–10.5)
Calcium: 8 mg/dL — ABNORMAL LOW (ref 8.4–10.5)
Calcium: 8.1 mg/dL — ABNORMAL LOW (ref 8.4–10.5)
Chloride: 100 mEq/L (ref 96–112)
Chloride: 107 mEq/L (ref 96–112)
Chloride: 107 mEq/L (ref 96–112)
GFR calc Af Amer: >60 mL/min (ref >60)
GFR calc Af Amer: >60 mL/min (ref >60)
GFR calc Af Amer: >60 mL/min (ref >60)
GFR calc non Af Amer: >60 mL/min (ref >60)
GFR calc non Af Amer: >60 mL/min (ref >60)
Glucose, Bld: 142 mg/dL — ABNORMAL HIGH (ref 70–99)
Glucose, Bld: 179 mg/dL — ABNORMAL HIGH (ref 70–99)
Potassium: 3.4 mEq/L — ABNORMAL LOW (ref 3.5–5.1)
Potassium: 4.3 mEq/L (ref 3.5–5.1)
Potassium: 4.4 mEq/L (ref 3.5–5.1)
Sodium: 133 mEq/L — ABNORMAL LOW (ref 135–145)
Sodium: 135 mEq/L (ref 135–145)
Sodium: 137 mEq/L (ref 135–145)
Sodium: 138 mEq/L (ref 135–145)

## 2010-05-28 LAB — POCT I-STAT 7, (LYTES, BLD GAS, ICA,H+H)
Acid-base deficit: 3 mmol/L — ABNORMAL HIGH (ref 0.0–2.0)
Bicarbonate: 22.3 mEq/L (ref 20.0–24.0)
Calcium, Ion: 0.99 mmol/L — ABNORMAL LOW (ref 1.12–1.32)
O2 Saturation: 99 %
pO2, Arterial: 124 mmHg — ABNORMAL HIGH (ref 80.0–100.0)

## 2010-05-28 LAB — CBC
HCT: 29.9 % — ABNORMAL LOW (ref 39.0–52.0)
HCT: 30.3 % — ABNORMAL LOW (ref 39.0–52.0)
HCT: 30.5 % — ABNORMAL LOW (ref 39.0–52.0)
HCT: 32.3 % — ABNORMAL LOW (ref 39.0–52.0)
HCT: 32.8 % — ABNORMAL LOW (ref 39.0–52.0)
Hemoglobin: 10.2 g/dL — ABNORMAL LOW (ref 13.0–17.0)
Hemoglobin: 10.5 g/dL — ABNORMAL LOW (ref 13.0–17.0)
Hemoglobin: 10.5 g/dL — ABNORMAL LOW (ref 13.0–17.0)
Hemoglobin: 10.7 g/dL — ABNORMAL LOW (ref 13.0–17.0)
Hemoglobin: 12.2 g/dL — ABNORMAL LOW (ref 13.0–17.0)
MCHC: 34.6 g/dL (ref 30.0–36.0)
MCHC: 34.7 g/dL (ref 30.0–36.0)
MCHC: 34.7 g/dL (ref 30.0–36.0)
MCHC: 35 g/dL (ref 30.0–36.0)
MCV: 93 fL (ref 78.0–100.0)
MCV: 93 fL (ref 78.0–100.0)
MCV: 93.1 fL (ref 78.0–100.0)
MCV: 93.2 fL (ref 78.0–100.0)
MCV: 93.7 fL (ref 78.0–100.0)
RBC: 3.22 MIL/uL — ABNORMAL LOW (ref 4.22–5.81)
RBC: 3.31 MIL/uL — ABNORMAL LOW (ref 4.22–5.81)
RBC: 3.44 MIL/uL — ABNORMAL LOW (ref 4.22–5.81)
RBC: 3.52 MIL/uL — ABNORMAL LOW (ref 4.22–5.81)
RBC: 3.8 MIL/uL — ABNORMAL LOW (ref 4.22–5.81)
RDW: 12.9 % (ref 11.5–15.5)
RDW: 13.4 % (ref 11.5–15.5)
WBC: 11.3 10*3/uL — ABNORMAL HIGH (ref 4.0–10.5)
WBC: 15.2 10*3/uL — ABNORMAL HIGH (ref 4.0–10.5)
WBC: 7.9 10*3/uL (ref 4.0–10.5)
WBC: 8.3 10*3/uL (ref 4.0–10.5)

## 2010-05-28 LAB — COMPREHENSIVE METABOLIC PANEL
ALT: 14 U/L (ref 0–53)
AST: 13 U/L (ref 0–37)
CO2: 24 mEq/L (ref 19–32)
Calcium: 7.7 mg/dL — ABNORMAL LOW (ref 8.4–10.5)
Chloride: 104 mEq/L (ref 96–112)
GFR calc Af Amer: >60 mL/min (ref >60)
GFR calc non Af Amer: >60 mL/min (ref >60)
Glucose, Bld: 167 mg/dL — ABNORMAL HIGH (ref 70–99)
Sodium: 140 mEq/L (ref 135–145)
Total Bilirubin: 0.9 mg/dL (ref 0.3–1.2)

## 2010-05-28 LAB — POCT I-STAT 4, (NA,K, GLUC, HGB,HCT)
Glucose, Bld: 180 mg/dL — ABNORMAL HIGH (ref 70–99)
Glucose, Bld: 252 mg/dL — ABNORMAL HIGH (ref 70–99)
HCT: 15 % — ABNORMAL LOW (ref 39.0–52.0)
HCT: 20 % — ABNORMAL LOW (ref 39.0–52.0)
HCT: 23 % — ABNORMAL LOW (ref 39.0–52.0)
HCT: 34 % — ABNORMAL LOW (ref 39.0–52.0)
Hemoglobin: 10.2 g/dL — ABNORMAL LOW (ref 13.0–17.0)
Hemoglobin: 5.1 g/dL — CL (ref 13.0–17.0)
Hemoglobin: 6.8 g/dL — CL (ref 13.0–17.0)
Potassium: 3.8 mEq/L (ref 3.5–5.1)
Potassium: 4.4 mEq/L (ref 3.5–5.1)
Potassium: 4.7 mEq/L (ref 3.5–5.1)
Sodium: 132 mEq/L — ABNORMAL LOW (ref 135–145)
Sodium: 136 mEq/L (ref 135–145)
Sodium: 136 mEq/L (ref 135–145)
Sodium: 139 mEq/L (ref 135–145)

## 2010-05-28 LAB — MAGNESIUM: Magnesium: 2.4 mg/dL (ref 1.5–2.5)

## 2010-05-28 LAB — POCT I-STAT 3, ART BLOOD GAS (G3+)
Acid-base deficit: 2 mmol/L (ref 0.0–2.0)
Acid-base deficit: 2 mmol/L (ref 0.0–2.0)
Bicarbonate: 21.4 mEq/L (ref 20.0–24.0)
O2 Saturation: 100 %
TCO2: 22 mmol/L (ref 0–100)
pCO2 arterial: 36.7 mmHg (ref 35.0–45.0)
pCO2 arterial: 40.7 mmHg (ref 35.0–45.0)
pH, Arterial: 7.364 (ref 7.350–7.450)
pH, Arterial: 7.375 (ref 7.350–7.450)
pO2, Arterial: 100 mmHg (ref 80.0–100.0)
pO2, Arterial: 204 mmHg — ABNORMAL HIGH (ref 80.0–100.0)

## 2010-05-28 LAB — HEMOGLOBIN A1C
Hgb A1c MFr Bld: 6.2 % — ABNORMAL HIGH (ref 4.6–6.1)
Mean Plasma Glucose: 137 mg/dL

## 2010-05-28 LAB — BLOOD GAS, ARTERIAL
Acid-Base Excess: 0.2 mmol/L (ref 0.0–2.0)
Bicarbonate: 23.6 mEq/L (ref 20.0–24.0)
O2 Saturation: 95.2 %
Patient temperature: 98.6
TCO2: 24.6 mmol/L (ref 0–100)
pO2, Arterial: 73.4 mmHg — ABNORMAL LOW (ref 80.0–100.0)

## 2010-05-28 LAB — POCT I-STAT GLUCOSE
Glucose, Bld: 157 mg/dL — ABNORMAL HIGH (ref 70–99)
Operator id: 3406

## 2010-05-28 LAB — URINE MICROSCOPIC-ADD ON

## 2010-05-28 LAB — TYPE AND SCREEN: ABO/RH(D): AB POS

## 2010-05-28 LAB — LIPID PANEL
Cholesterol: 100 mg/dL (ref 0–200)
LDL Cholesterol: 47 mg/dL (ref 0–99)

## 2010-05-28 LAB — APTT: aPTT: 26 seconds (ref 24–37)

## 2010-05-28 LAB — POCT I-STAT, CHEM 8
BUN: 7 mg/dL (ref 6–23)
Chloride: 105 mEq/L (ref 96–112)
HCT: 32 % — ABNORMAL LOW (ref 39.0–52.0)
Sodium: 136 mEq/L (ref 135–145)
TCO2: 17 mmol/L (ref 0–100)

## 2010-05-28 LAB — ABO/RH: ABO/RH(D): AB POS

## 2010-05-28 LAB — CREATININE, SERUM
GFR calc Af Amer: >60 mL/min (ref >60)
GFR calc non Af Amer: >60 mL/min (ref >60)

## 2010-05-28 LAB — PROTIME-INR
INR: 1.3 (ref 0.00–1.49)
INR: 2 — ABNORMAL HIGH (ref 0.00–1.49)

## 2010-06-02 LAB — CARBOXYHEMOGLOBIN
Carboxyhemoglobin: 1.3 % (ref 0.5–1.5)
Carboxyhemoglobin: 1.4 % (ref 0.5–1.5)
Carboxyhemoglobin: 1.4 % (ref 0.5–1.5)
Carboxyhemoglobin: 1.4 % (ref 0.5–1.5)
Carboxyhemoglobin: 1.6 % — ABNORMAL HIGH (ref 0.5–1.5)
Carboxyhemoglobin: 1.6 % — ABNORMAL HIGH (ref 0.5–1.5)
Carboxyhemoglobin: 1.7 % — ABNORMAL HIGH (ref 0.5–1.5)
Methemoglobin: 1.5 % (ref 0.0–1.5)
Methemoglobin: 1.6 % — ABNORMAL HIGH (ref 0.0–1.5)
Methemoglobin: 1.7 % — ABNORMAL HIGH (ref 0.0–1.5)
Methemoglobin: 1.8 % — ABNORMAL HIGH (ref 0.0–1.5)
Methemoglobin: 1.8 % — ABNORMAL HIGH (ref 0.0–1.5)
Methemoglobin: 1.8 % — ABNORMAL HIGH (ref 0.0–1.5)
Methemoglobin: 1.9 % — ABNORMAL HIGH (ref 0.0–1.5)
Methemoglobin: 2 % — ABNORMAL HIGH (ref 0.0–1.5)
O2 Saturation: 63.3 %
O2 Saturation: 63.4 %
O2 Saturation: 66.1 %
O2 Saturation: 70.1 %
O2 Saturation: 70.3 %
O2 Saturation: 74.4 %
O2 Saturation: 79 %
Total hemoglobin: 10 g/dL — ABNORMAL LOW (ref 13.5–18.0)
Total hemoglobin: 7.8 g/dL — CL (ref 13.5–18.0)
Total hemoglobin: 7.9 g/dL — CL (ref 13.5–18.0)
Total hemoglobin: 8.1 g/dL — ABNORMAL LOW (ref 13.5–18.0)
Total hemoglobin: 8.9 g/dL — ABNORMAL LOW (ref 13.5–18.0)
Total hemoglobin: 9 g/dL — ABNORMAL LOW (ref 13.5–18.0)
Total hemoglobin: 9.4 g/dL — ABNORMAL LOW (ref 13.5–18.0)

## 2010-06-02 LAB — BASIC METABOLIC PANEL
BUN: 21 mg/dL (ref 6–23)
BUN: 22 mg/dL (ref 6–23)
BUN: 22 mg/dL (ref 6–23)
BUN: 23 mg/dL (ref 6–23)
CO2: 18 mEq/L — ABNORMAL LOW (ref 19–32)
CO2: 18 mEq/L — ABNORMAL LOW (ref 19–32)
CO2: 20 mEq/L (ref 19–32)
CO2: 21 mEq/L (ref 19–32)
Calcium: 6.8 mg/dL — ABNORMAL LOW (ref 8.4–10.5)
Calcium: 7 mg/dL — ABNORMAL LOW (ref 8.4–10.5)
Calcium: 7.2 mg/dL — ABNORMAL LOW (ref 8.4–10.5)
Calcium: 8.1 mg/dL — ABNORMAL LOW (ref 8.4–10.5)
Chloride: 109 mEq/L (ref 96–112)
Chloride: 110 mEq/L (ref 96–112)
Chloride: 111 mEq/L (ref 96–112)
Chloride: 112 mEq/L (ref 96–112)
Chloride: 112 mEq/L (ref 96–112)
Creatinine, Ser: 0.91 mg/dL (ref 0.4–1.5)
Creatinine, Ser: 1.26 mg/dL (ref 0.4–1.5)
Creatinine, Ser: 1.31 mg/dL (ref 0.4–1.5)
Creatinine, Ser: 1.42 mg/dL (ref 0.4–1.5)
Creatinine, Ser: 1.47 mg/dL (ref 0.4–1.5)
GFR calc Af Amer: 56 mL/min — ABNORMAL LOW (ref >60)
GFR calc Af Amer: >60 mL/min (ref >60)
GFR calc Af Amer: >60 mL/min (ref >60)
GFR calc Af Amer: >60 mL/min (ref >60)
GFR calc Af Amer: >60 mL/min (ref >60)
GFR calc non Af Amer: 38 mL/min — ABNORMAL LOW (ref >60)
GFR calc non Af Amer: 53 mL/min — ABNORMAL LOW (ref >60)
GFR calc non Af Amer: 57 mL/min — ABNORMAL LOW (ref >60)
GFR calc non Af Amer: >60 mL/min (ref >60)
Glucose, Bld: 137 mg/dL — ABNORMAL HIGH (ref 70–99)
Glucose, Bld: 162 mg/dL — ABNORMAL HIGH (ref 70–99)
Glucose, Bld: 167 mg/dL — ABNORMAL HIGH (ref 70–99)
Potassium: 3 mEq/L — ABNORMAL LOW (ref 3.5–5.1)
Potassium: 3.9 mEq/L (ref 3.5–5.1)
Potassium: 4.1 mEq/L (ref 3.5–5.1)
Potassium: 5.7 mEq/L — ABNORMAL HIGH (ref 3.5–5.1)
Sodium: 135 mEq/L (ref 135–145)
Sodium: 137 mEq/L (ref 135–145)
Sodium: 138 mEq/L (ref 135–145)
Sodium: 141 mEq/L (ref 135–145)
Sodium: 141 mEq/L (ref 135–145)

## 2010-06-02 LAB — GLUCOSE, CAPILLARY
Glucose-Capillary: 104 mg/dL — ABNORMAL HIGH (ref 70–99)
Glucose-Capillary: 114 mg/dL — ABNORMAL HIGH (ref 70–99)
Glucose-Capillary: 123 mg/dL — ABNORMAL HIGH (ref 70–99)
Glucose-Capillary: 131 mg/dL — ABNORMAL HIGH (ref 70–99)
Glucose-Capillary: 141 mg/dL — ABNORMAL HIGH (ref 70–99)
Glucose-Capillary: 144 mg/dL — ABNORMAL HIGH (ref 70–99)
Glucose-Capillary: 149 mg/dL — ABNORMAL HIGH (ref 70–99)
Glucose-Capillary: 156 mg/dL — ABNORMAL HIGH (ref 70–99)
Glucose-Capillary: 158 mg/dL — ABNORMAL HIGH (ref 70–99)
Glucose-Capillary: 185 mg/dL — ABNORMAL HIGH (ref 70–99)
Glucose-Capillary: 221 mg/dL — ABNORMAL HIGH (ref 70–99)
Glucose-Capillary: 237 mg/dL — ABNORMAL HIGH (ref 70–99)
Glucose-Capillary: 255 mg/dL — ABNORMAL HIGH (ref 70–99)
Glucose-Capillary: 60 mg/dL — ABNORMAL LOW (ref 70–99)
Glucose-Capillary: >600 mg/dL (ref 70–99)

## 2010-06-02 LAB — BLOOD GAS, ARTERIAL
Acid-base deficit: 5.1 mmol/L — ABNORMAL HIGH (ref 0.0–2.0)
Acid-base deficit: 5.9 mmol/L — ABNORMAL HIGH (ref 0.0–2.0)
Acid-base deficit: 8.2 mmol/L — ABNORMAL HIGH (ref 0.0–2.0)
Acid-base deficit: 9.7 mmol/L — ABNORMAL HIGH (ref 0.0–2.0)
Bicarbonate: 16.2 mEq/L — ABNORMAL LOW (ref 20.0–24.0)
Bicarbonate: 16.5 mEq/L — ABNORMAL LOW (ref 20.0–24.0)
Bicarbonate: 16.8 mEq/L — ABNORMAL LOW (ref 20.0–24.0)
Bicarbonate: 17.7 mEq/L — ABNORMAL LOW (ref 20.0–24.0)
Drawn by: 129801
Drawn by: 129801
Drawn by: 129801
Drawn by: 232811
FIO2: 0.1 %
FIO2: 0.4 %
FIO2: 0.4 %
FIO2: 0.5 %
MECHVT: 440 mL
MECHVT: 500 mL
MECHVT: 600 mL
MECHVT: 600 mL
O2 Saturation: 97.8 %
O2 Saturation: 98 %
O2 Saturation: 98.5 %
O2 Saturation: 99.5 %
PEEP: 5 cmH2O
PEEP: 5 cmH2O
PEEP: 5 cmH2O
PEEP: 5 cmH2O
Patient temperature: 98.6
Patient temperature: 98.6
Patient temperature: 98.6
Patient temperature: 98.6
Patient temperature: 98.6
RATE: 18 resp/min
RATE: 20 resp/min
RATE: 20 resp/min
RATE: 24 resp/min
TCO2: 14.9 mmol/L (ref 0–100)
TCO2: 15.8 mmol/L (ref 0–100)
TCO2: 16.4 mmol/L (ref 0–100)
pCO2 arterial: 32.2 mmHg — ABNORMAL LOW (ref 35.0–45.0)
pCO2 arterial: 35.3 mmHg (ref 35.0–45.0)
pCO2 arterial: 46 mmHg — ABNORMAL HIGH (ref 35.0–45.0)
pH, Arterial: 7.329 — ABNORMAL LOW (ref 7.350–7.450)
pH, Arterial: 7.364 (ref 7.350–7.450)
pO2, Arterial: 105 mmHg — ABNORMAL HIGH (ref 80.0–100.0)
pO2, Arterial: 105 mmHg — ABNORMAL HIGH (ref 80.0–100.0)
pO2, Arterial: 108 mmHg — ABNORMAL HIGH (ref 80.0–100.0)
pO2, Arterial: 154 mmHg — ABNORMAL HIGH (ref 80.0–100.0)
pO2, Arterial: 99.7 mmHg (ref 80.0–100.0)

## 2010-06-02 LAB — TYPE AND SCREEN
ABO/RH(D): AB POS
Antibody Screen: NEGATIVE

## 2010-06-02 LAB — CARDIAC PANEL(CRET KIN+CKTOT+MB+TROPI)
CK, MB: 11.3 ng/mL — ABNORMAL HIGH (ref 0.3–4.0)
CK, MB: 11.5 ng/mL — ABNORMAL HIGH (ref 0.3–4.0)
CK, MB: 7.4 ng/mL — ABNORMAL HIGH (ref 0.3–4.0)
Relative Index: 1.2 (ref 0.0–2.5)
Relative Index: 1.8 (ref 0.0–2.5)
Total CK: 1683 U/L — ABNORMAL HIGH (ref 7–232)
Total CK: 645 U/L — ABNORMAL HIGH (ref 7–232)
Total CK: 726 U/L — ABNORMAL HIGH (ref 7–232)
Troponin I: 0.64 ng/mL (ref 0.00–0.06)
Troponin I: 0.76 ng/mL (ref 0.00–0.06)

## 2010-06-02 LAB — COMPREHENSIVE METABOLIC PANEL
ALT: 21 U/L (ref 0–53)
AST: 33 U/L (ref 0–37)
Albumin: 3.9 g/dL (ref 3.5–5.2)
Alkaline Phosphatase: 53 U/L (ref 39–117)
BUN: 22 mg/dL (ref 6–23)
CO2: 22 mEq/L (ref 19–32)
Calcium: 8.8 mg/dL (ref 8.4–10.5)
Chloride: 117 mEq/L — ABNORMAL HIGH (ref 96–112)
GFR calc Af Amer: >60 mL/min (ref >60)
GFR calc non Af Amer: 40 mL/min — ABNORMAL LOW (ref >60)
GFR calc non Af Amer: 57 mL/min — ABNORMAL LOW (ref >60)
Glucose, Bld: 129 mg/dL — ABNORMAL HIGH (ref 70–99)
Potassium: 3.8 mEq/L (ref 3.5–5.1)
Sodium: 134 mEq/L — ABNORMAL LOW (ref 135–145)
Total Bilirubin: 0.9 mg/dL (ref 0.3–1.2)
Total Protein: 6.7 g/dL (ref 6.0–8.3)

## 2010-06-02 LAB — CBC
HCT: 22.5 % — ABNORMAL LOW (ref 39.0–52.0)
HCT: 27 % — ABNORMAL LOW (ref 39.0–52.0)
HCT: 31.5 % — ABNORMAL LOW (ref 39.0–52.0)
HCT: 33.1 % — ABNORMAL LOW (ref 39.0–52.0)
HCT: 36 % — ABNORMAL LOW (ref 39.0–52.0)
Hemoglobin: 11.3 g/dL — ABNORMAL LOW (ref 13.0–17.0)
Hemoglobin: 7.8 g/dL — CL (ref 13.0–17.0)
Hemoglobin: 9.3 g/dL — ABNORMAL LOW (ref 13.0–17.0)
MCHC: 34.2 g/dL (ref 30.0–36.0)
MCHC: 34.3 g/dL (ref 30.0–36.0)
MCHC: 34.5 g/dL (ref 30.0–36.0)
MCHC: 34.6 g/dL (ref 30.0–36.0)
MCV: 92.6 fL (ref 78.0–100.0)
MCV: 92.6 fL (ref 78.0–100.0)
MCV: 92.7 fL (ref 78.0–100.0)
MCV: 93.3 fL (ref 78.0–100.0)
MCV: 93.3 fL (ref 78.0–100.0)
Platelets: 123 10*3/uL — ABNORMAL LOW (ref 150–400)
Platelets: 185 10*3/uL (ref 150–400)
Platelets: 277 10*3/uL (ref 150–400)
RBC: 2.42 MIL/uL — ABNORMAL LOW (ref 4.22–5.81)
RBC: 2.92 MIL/uL — ABNORMAL LOW (ref 4.22–5.81)
RBC: 3.4 MIL/uL — ABNORMAL LOW (ref 4.22–5.81)
RBC: 3.89 MIL/uL — ABNORMAL LOW (ref 4.22–5.81)
RBC: 4.05 MIL/uL — ABNORMAL LOW (ref 4.22–5.81)
RDW: 12.6 % (ref 11.5–15.5)
RDW: 13.2 % (ref 11.5–15.5)
WBC: 10.8 10*3/uL — ABNORMAL HIGH (ref 4.0–10.5)
WBC: 12.3 10*3/uL — ABNORMAL HIGH (ref 4.0–10.5)
WBC: 6.5 10*3/uL (ref 4.0–10.5)
WBC: 8.6 10*3/uL (ref 4.0–10.5)

## 2010-06-02 LAB — URINALYSIS, ROUTINE W REFLEX MICROSCOPIC
Bilirubin Urine: NEGATIVE
Nitrite: NEGATIVE
Specific Gravity, Urine: 1.02 (ref 1.005–1.030)
Urobilinogen, UA: 0.2 mg/dL (ref 0.0–1.0)

## 2010-06-02 LAB — LACTIC ACID, PLASMA
Lactic Acid, Venous: 0.7 mmol/L (ref 0.5–2.2)
Lactic Acid, Venous: 0.7 mmol/L (ref 0.5–2.2)
Lactic Acid, Venous: 1.3 mmol/L (ref 0.5–2.2)
Lactic Acid, Venous: 4 mmol/L — ABNORMAL HIGH (ref 0.5–2.2)

## 2010-06-02 LAB — APTT: aPTT: 32 seconds (ref 24–37)

## 2010-06-02 LAB — POCT CARDIAC MARKERS: Troponin i, poc: 0.09 ng/mL (ref 0.00–0.09)

## 2010-06-02 LAB — POCT I-STAT, CHEM 8
BUN: 21 mg/dL (ref 6–23)
Creatinine, Ser: 1 mg/dL (ref 0.4–1.5)
Potassium: 5.8 mEq/L — ABNORMAL HIGH (ref 3.5–5.1)
Sodium: 137 mEq/L (ref 135–145)

## 2010-06-02 LAB — DIFFERENTIAL
Basophils Absolute: 0 10*3/uL (ref 0.0–0.1)
Basophils Relative: 0 % (ref 0–1)
Eosinophils Absolute: 0 10*3/uL (ref 0.0–0.7)
Eosinophils Relative: 0 % (ref 0–5)
Lymphs Abs: 0.7 10*3/uL (ref 0.7–4.0)
Monocytes Absolute: 0.9 10*3/uL (ref 0.1–1.0)
Monocytes Relative: 6 % (ref 3–12)
Neutro Abs: 8.1 10*3/uL — ABNORMAL HIGH (ref 1.7–7.7)
Neutrophils Relative %: 75 % (ref 43–77)

## 2010-06-02 LAB — SODIUM, URINE, RANDOM: Sodium, Ur: 100 mEq/L

## 2010-06-02 LAB — URINE CULTURE: Colony Count: NO GROWTH

## 2010-06-02 LAB — CULTURE, BLOOD (ROUTINE X 2): Culture: NO GROWTH

## 2010-06-02 LAB — PROTIME-INR
INR: 1.3 (ref 0.00–1.49)
Prothrombin Time: 16.5 seconds — ABNORMAL HIGH (ref 11.6–15.2)

## 2010-06-02 LAB — PHENYTOIN LEVEL, TOTAL: Phenytoin Lvl: 6.9 ug/mL — ABNORMAL LOW (ref 10.0–20.0)

## 2010-06-02 LAB — CULTURE, BAL-QUANTITATIVE W GRAM STAIN: Colony Count: 50000

## 2010-06-02 LAB — CK TOTAL AND CKMB (NOT AT ARMC)
Relative Index: 2.2 (ref 0.0–2.5)
Total CK: 500 U/L — ABNORMAL HIGH (ref 7–232)

## 2010-06-02 LAB — SALICYLATE LEVEL: Salicylate Lvl: <4 mg/dL (ref 2.8–20.0)

## 2010-06-02 LAB — URINE MICROSCOPIC-ADD ON

## 2010-06-02 LAB — ACETAMINOPHEN LEVEL: Acetaminophen (Tylenol), Serum: <10 ug/mL — ABNORMAL LOW (ref 10–30)

## 2010-06-02 LAB — LIPID PANEL
Cholesterol: 170 mg/dL (ref 0–200)
HDL: 42 mg/dL (ref >39)
LDL Cholesterol: 106 mg/dL — ABNORMAL HIGH (ref 0–99)
Total CHOL/HDL Ratio: 4 RATIO
Triglycerides: 111 mg/dL (ref <150)

## 2010-06-02 LAB — ETHANOL: Alcohol, Ethyl (B): 5 mg/dL (ref 0–10)

## 2010-06-30 NOTE — Consult Note (Signed)
NAMEZAIDIN, BLYDEN              ACCOUNT NO.:  [id number]   MEDICAL RECORD NO.:  [id number]          PATIENT TYPE:  INP   LOCATION:  2015                         FACILITY:  [facility]   PHYSICIAN:  Levert Feinstein, MD          DATE OF BIRTH:  [birth year]   DATE OF CONSULTATION:  04/25/2008  DATE OF DISCHARGE:                                 CONSULTATION   CHIEF COMPLAINT:  Unsteady gait.   HISTORY OF PRESENT ILLNESS:  This is a pleasant 77 year old male who  initially presented to [facility], status post seizure with  repeated episodes while he was in the emergency room on April 12, 2008.  At that time, he was intubated.  The patient's complications  included hypotension.  The patient spiked a fever of 102 and was treated  for septic shock/SIRS.  Head CT was negative at that time for any acute  findings; however, the patient did experience some acute renal failure  with creatinine of 1.73.  The patient was noted to have elevated CK  enzymes from Cardiology.  The patient's seizures at that time were  thought to be secondary to subtherapeutic Dilantin levels.  Blood  cultures were taken while he was at [facility], which were negative and  he had positive sputum cultures.  The patient was loaded with Plavix and  he underwent a cardiac catheterization on April 15, 2008.  Cardiac  catheterization showed 2-vessel disease with 90% stenosis of the right  coronary artery and 80% of the circumflex artery.  Ejection fraction was  estimated to be 60%.  There were no wall abnormalities.  On April 22, 2008, the patient underwent a median sternotomy, extracorporeal  circulation coronary artery bypass grafting x3.  During recovery phase  and with cardiac rehab, the patient did very well, had no complications;   However, on April 25, 2008, the patient was watching TV and talking to a  [employer], when he suddenly noticed that he had blurred vision  and became dizzy.  The patient was  noted to be diaphoretic at that  time.  Nurse was called into the room.  The patient was sat at the edge  of his bed and orthostatic blood pressures were taken along with blood  glucose levels.  His blood glucose level was 190 and orthostatics were  negative.  The patient at that time was asked to stand at the edge of  the bed and he noted that he had significant difficulty,  stating he was  weak and felt unsteady.  He did not state that the room was moving  from side to side or any vertiginous movements.  He did not complain of  any chest pain, shortness of breath, or palpitations at that time.  He  had no nausea or vomiting at that time.  On examination, the patient  denies any double vision, any balancing of his legs when he tries to  stand up; however, his blurred vision remains constant without diplopia.  According to family members, he has had difficulty with his vision in  the past, but it is unclear the exact time frame and quality of this  blurred vision.  At the time of interview, the patient's main complaint  is weakness and unsteadiness of gait along with blurred vision.   PAST MEDICAL HISTORY:  Hypertension, diabetes, high cholesterol,  myocardial infarct at the age of 88, seizure disorder for which he is on  Dilantin, CAD, transitory acute renal failure, GERD, anxiety, and tremor  along with medical noncompliance.   MEDICATIONS AT HOME:  He takes carbamazepine, phenytoin 300 mg daily,  simvastatin 40 mg daily, metoprolol XL 100 mg daily, glyburide 5 mg  b.i.d., metformin 1 g b.i.d., lisinopril 20 mg daily, and meloxicam 15  mg daily.   ALLERGIES:  None.   FAMILY HISTORY:  Positive for CAD.   SOCIAL HISTORY:  The patient quit smoking and now chews tobacco.  He  does have a history of alcohol use, however, does not drink at this  time.  He does not use illicit drugs, and he is retired.   REVIEW OF SYSTEMS:  All negative with the exception of the above.   PHYSICAL  EXAMINATION:  VITAL SIGNS:  Blood pressure is 123/66, pulse 84,  respiratory rate 18, temperature 98.7.  MENTAL STATUS:  He is alert.  He is oriented x3.  He carries out 2- and  3-step commands without any difficulty.  Pupils are equal, round,  reactive, and accommodating to light.  Conjugate gaze.  Extraocular  muscles are intact.  Visual fields are intact.  Face is symmetrical.  Tongue is midline.  Uvula is midline.  Sensation in V1-V3 is full.  Shoulder shrug, head turn is within normal limits.  Coordination, finger-  to-nose was slightly ataxic, however, heel-to-shin showed more ataxia  bilaterally.  Rapid alternating movements of his hands were slow, but no  significant clumsiness.  Motor is 5/5 in all extremities.  He has no  clonus.  No increased tone.  Normal bulk.  No cogwheel rigidity with  gait.  The patient shows truncal instability.  Positive Romberg sign.  Unsteady with standing with his feet together and while walking, the  patient needed to use the walker in order to ambulate and used short  magnetic steps with multiple step turns.  It was most noticeable the  patient had fear of falling when walker was removed.  Drift, negative  drift at the upper and lower extremities.  PULMONARY:  Clear to auscultation.  CARDIOVASCULAR:  S1, S2.  Regular rate and rhythm.  NECK:  Negative for bruits and supple.  NEUROLOGIC:  Sensation is full throughout to pinprick, light touch,  vibration.   LABORATORY DATA:  Dilantin is subtherapeutic at 8.2.  Sodium 135,  potassium 4.3, chloride is 103, CO2 is 25, BUN is 11, creatinine 0.91,  glucose 190.  White blood cell count is 1.3, platelet 212.  Hemoglobin  and hematocrit is 10.7 and 30.5.   IMAGING AND TEST:  CT scan is pending at this time.  CVA workup, the  patient was last seen normal at 2:00 p.m. on April 25, 2008.  NIH stroke  scale was 2.  TPA was not administered secondary to the patient having  surgery on April 22, 2008.  Stroke risk  factors include diabetes,  hypertension, high lipids, hyperlipidemia, and history of smoking.  The  patient was fully capable of caring for himself, would drive his own  car, take care of his own bills, and ambulate without any difficulty  prior to this event.  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   TREATMENT AND PLAN:  1. CT of head now and in the morning.  2. Carotid Dopplers.  3. Fasting lipid profile.  Continue Plavix for PT for gait therapy.   The patient will be followed by stroke MD while he is in house.  Thank  you very much for this consultation.     ______________________________  Felicie Morn, PA-C      Levert Feinstein, MD  Electronically Signed    DS/MEDQ  D:  04/25/2008  T:  04/26/2008  Job:  [id number]   cc:   Salvatore Decent. Dorris Fetch, M.D.  Levert Feinstein, MD

## 2010-06-30 NOTE — Cardiovascular Report (Signed)
[name], [name] NO.:  [id number]   MEDICAL RECORD NO.:  [id number]          PATIENT TYPE:  INP   LOCATION:  2507                         FACILITY:  [facility]   PHYSICIAN:  Marca Ancona, MD      DATE OF BIRTH:  1929/01/21   DATE OF PROCEDURE:  DATE OF DISCHARGE:                            CARDIAC CATHETERIZATION   PROCEDURES:  1. Left heart catheterization.  2. Coronary angiography.  3. Left ventriculography.   INDICATION:  Non-ST elevation MI in the setting of hypertension/septic  shock type picture.   PROCEDURE NOTE:  After informed consent was obtained, the right groin  was sterilely prepped and draped.  A 1% lidocaine was used to locally  anesthetize the right groin area.  The right common femoral artery was  entered using Seldinger technique and a 5-French arterial sheath was  placed.  The left coronary artery was engaged using the 5-French  multipurpose catheter.  The right coronary artery was engaged using the  5-French multipurpose catheter.  The left ventricle was entered using  the 5-French multipurpose catheter.  We did go back and obtained  additional views of the right coronary artery using the 5-French JR-4  catheter.   FINDINGS:  1. Hemodynamics.  Aorta 139/51, LV 139/16/21.  2. Left ventriculography.  This is done with the hand ventriculogram.      EF was estimated at 60%.  No wall motion abnormalities were noted.  3. Coronary angiography.  The coronary system is right dominant.  The      RCA is a large vessel, runs around to the apex.  There are diffuse      luminal irregularities in the body of the RCA; however, there is a      short ostial 90% stenosis in the RCA.  The catheter did damp and we      did take additional views.  We used nitroglycerin and it does      appear to be a true ostial atherosclerotic lesion and not just      catheter-induced spasm.  Left main is free from significant      disease.  The LAD is a relatively small  artery compared to the RCA.      The body of the LAD itself has only luminal irregularities.  There      is a second diagonal that branches into a superior and an inferior      division.  The inferior division has approximately 50% stenosis.      The left circumflex artery has an 80% proximal stenosis, just after      a small first obtuse marginal.  At the proximal end of this 80%      lesion, the circumflex turns in an almost 90-degree angle.  This is      best seen in the LAO caudal view.   ASSESSMENT AND PLAN:  This is a 77 year old complicated patient who  presented to [facility] after a seizure in which he fell and  hit his head and developed postictal confusion.  He was intubated for  airway protection.  While he was intubated, he developed septic shock  type picture and was briefly on pressors.  All blood cultures, however,  were negative.  There was some concern for aspiration pneumonia.  The  patient is now extubated and doing well.  He does have a history of  seizure disorder and his seizure was thought to be likely due to  subtherapeutic Dilantin levels.  While the patient was intubated and on  pressors, he was noted to have elevated troponin.  He additionally  reports occasional episodes of angina while bowling.  This happens maybe  once a month.  He was started on Plavix while at [facility] several  days ago, and he was sent for a cath today.  He does have a tight ostial  RCA lesion that appears to be due to atherosclerosis and not catheter  spasm.  The RCA is very large.  There is also an 80% proximal circumflex  stenosis.  The circumflex turns in a 90 degree angle at the proximal end  of the diseased portion of the vessel.  The circumflex lesion would be a  difficult intervention given the angle.  The ostial RCA would require  long-term Plavix, as would the circumflex, if intervened upon.  I did  review the films with  Dr. Riley Kill.  We questioned the safety of  long-term Plavix in this  patient as he just presented with s seizure in which he fell and hit his  head.  We would like evaluation by CT surgery.  Hopefully an arterial  conduit could be used to the RCA.      Marca Ancona, MD  Electronically Signed     DM/MEDQ  D:  04/15/2008  T:  04/16/2008  Job:  [id number]

## 2010-06-30 NOTE — Consult Note (Signed)
[name]              ACCOUNT NO.:  [id number]   MEDICAL RECORD NO.:  [id number]          PATIENT TYPE:  INP   LOCATION:  2002                         FACILITY:  [facility]   PHYSICIAN:  Melvyn Novas, M.D.  DATE OF BIRTH:  08-26-1928   DATE OF CONSULTATION:  06/12/2008  DATE OF DISCHARGE:                                 CONSULTATION   HISTORY OF PRESENT ILLNESS:  This gentleman has a history of  longstanding seizure disorder but had been also achieving a relatively  long period of seizure freedom.  He had been on Dilantin since the year  2000 and suffered  a generalized seizure in March , which  occurred at  home - this let to a fall.  He states he broke or strained a rib.  He was evaluated for the chest pain resulting and in this coincidental,  he developed fever with a temperature of 102 degrees Fahrenheit.  He was  treated for septic shock.  Head CT was negative.  However, he did experience renal failure in the  coming days and the creatinine rose to 1.73.  All this happened on March  5 or April 20, 2008.  He then was found to have subtherapeutic Dilantin levels during this  hospitalization and was seen in consultation by my colleague, Dr. Levert Feinstein.  Blood cultures were taken.  He was loaded with Plavix.  He underwent  cardiac catheterization after his cardiac enzymes were elevated.  Catheterization showed two vessel disease with 90% stenosis of the right  coronal artery and 80% of the circumflexes.  He had an ejection fraction  estimated to be in the normal range of 60% and no visible wall  abnormalities on 2-D echo.  He underwent a median sternotomy on April 22, 2008, extracorporal circulation coronary artery bypass grafting and  during the recovery phase did very well and had no complications.  On  April 25, 2008, he noticed that he had blurred vision and became dizzy.  This is when Dr. Terrace Arabia was called.  His Dilantin was increased as a  response from 300 mg daily  to 400 mg divided in b.i.d. dosing.  He ruled  in for non-Q-wave myocardial infarction at the time and saw Dr.  Dorris Fetch of the CVTS group.  Dr. Terrace Arabia suspected that he may have had a  smaller stroke that was CT negative.  However, an MRI was not obtainable  during the hospitalization due to his recent sternotomy and metal clip.  The symptoms were those of a posterior cerebrovascular incident with  vertigo and eye movement abnormalities.  The patient also has multiple stroke risk factors including diabetes,  hypertension, hyperlipidemia, and a history of smoking..  Today, he  presents with a 2-day history of gait ataxia and slurring of speech  noticed to be more present in the morning hours and with a waxing and  waning course.  A Code Stroke had been called which has meanwhile been cancelled and he  was evaluated by Linus Orn, our stroke nurse of the Rapid Response  Team.   Current presentation in EWR:  The patient is fully oriented ,but is  unable to sit straight without truncal ataxia and there is evidence that  he still has some dysarthria/ but no facial droop is noted.  He has had  difficulties with standing up and walking straightly since beginning of  April, this month and he has had numbness on the right hand involving  the ring finger and little finger as well as the palm of his right hand.  He had some numbness in the medial aspect of his right leg being post  catheterization at the time.  He will get dizzy when he suddenly moves and has not had an improvement  in these symptoms so far, but he feels that the acute dizziness and  blurred vision that he complained about in March have resolved.  He is  here in no acute distress.  His blood pressure is 139/56, but at the time of his admission here at  2:00 p.m. was 181/66 and may contribute to some of his dizziness.  He  also states clearly that he again has the sensation of fourth and fifth  finger and numbness in the  right hand and he feels that his speech is  pretty back to normal while his daughter here present at bedside denies  that it has completely recovered.  He had no recent falls.   Denies any fever, any chills, any nausea, diarrhea, or vomiting.  He  does mention that his Dilantin dose has been changed and he and his  daughter seem to have decided that it is most likely a Dilantin side  effect which of course is possible.  He had no nausea or vomiting over  the last week and he has no complaints of his vision with any acute  changes such as visual changes in the visual field nor diplopia.  There  is no ptosis and his pupils react bilaterally to light.   PAST MEDICAL HISTORY:  1. Hypertension.  2. Diabetes.  3. High cholesterol.  4. Myocardial infarct at age 39, again non-Q-wave infarct this year.  5. Seizure disorder for which he is on Dilantin for over 2 decades.  6. Coronary artery disease.  7. Transitional renal failure.  8. GERD.  9. Anxiety.  10.Tremor along with medical noncompliance due to financial situation      that has in the past been difficult for him.   MEDICATIONS AT HOME:  1. Phenytoin now 200 mg b.i.d.  2. Simvastatin 40 mg daily.  3. Metoprolol XL 100 mg daily.  4. Glyburide 5 mg b.i.d.  5. Metformin 1 gram b.i.d.  6. Lisinopril 20 mg daily.  7. Meloxicam 15 mg daily.  I think the meloxicam is currently on hold.   He has also taken, mirtazapine in the past and I cannot at this time  find this on his active medication list nor do a noted dose that he may  take it at.   ALLERGIES:  He has no listed allergies.   FAMILY HISTORY:  Positive for coronary artery disease and early death of  several male family members.   SOCIAL HISTORY:  The patient is still an active tobacco user in form of  chewing tobacco but he quit smoking, has a history of alcohol abuse,  does not drink at this time and not for the last 10 years.  He has never  used illicit drugs.  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  He  lives independently and he has been told in March because  of the recent seizure that he will not drive for 6 months after his last  seizure, April 18, 2008.   REVIEW OF SYSTEMS:  All negative with exception of the above.   PHYSICAL EXAMINATION:  VITAL SIGNS:  Blood pressure is now 139/56, pulse  rate is 96, respiratory rate is 18, and temperature is 98.7 degrees  Fahrenheit.  LUNGS:  Clear to auscultation.  GENERAL:  He is alert and oriented x3, carries out 2-3 step commands.  HEENT:  His pupils are equal, but they slowly accommodate to light only.  He has no conjugation problems.  No ptosis.  No nystagmus.  Extraocular  movements appear intact and visual field to finger perimetry is intact.  The face appears symmetric and tongue is midline.  Uvula elevates  sluggishly but in midline.  NEUROLOGIC:  Shoulder shrug is present.  Coordination and finger-to-nose  is present.  He is ataxic when he sits up and he has significant gait  ataxia which makes the gait examination impossible.  Motor strength  appears to be 4+/5 in all extremities.  There is no clonus.  No rigor.  Normal bulk.  The reflexes are attenuated in the lower extremities and  present in the upper extremities.  Romberg sign was deferred.   LABORATORY DATA:  I reviewed today's labs.  White blood cell count is  9.8, hemoglobin/hematocrit is 11.5/33.4, and platelet count is 336.  Glucose is elevated at 226, but the patient did have a normal breakfast  he states.  His likely Dilantin toxicity at the phenytoin level of 24  mcg/mL.  Again, last month he was less than 8 mL but suffered a seizure.   MEDICATIONS:  A updated medication list is now available and it shows  that he is on:  1. Amlodipine 10 mg once a day.  2. Aspirin 325 mg once a day.  3. Carvedilol 75 mg twice a day.  4. Diazepam 10 mg oral p.r.n., this has not been taken.  5. Albuterol 2 mg oral twice a  day.  6. Glyburide 5 mg 2 tablets b.i.d.  7. Lisinopril 10 mg once a day.  8. Metformin 1 gram twice a day.  9. Protonix 40 mg once a day.  10.Simvastatin 40 mg once a day.  11.Phenytoin 200 mg twice a day.  12.Tramadol as needed.   I found out on his list tramadol which may not be a good medication for  a seizure patient as it is related to Ultram and lowers the seizure  threshold in some patients.   ASSESSMENT:  The patient suffers  probably from Dilantin toxicity  and  no evidence of a focal new deficit  is  seen.   I would like for the patient to discontinue the tramadol and to actually  rather use a narcotic pain medication if necessary as it does not affect  the seizure threshold.  I would like to return to a slightly lower phenytoin regimen and  recommended to give 100 mg capsule in the morning and a 50 mg chewable  at the same time and then 200 mg Dilantin in form of two 100 capsules at  night.  This decreased by 50 mg may be enough to get the patient back  into a high therapeutic range.  Since the patent is ambulatory impaired,  he is to be admitted for an observational stay.  He will follow up outpatient with [facility] office  as needed.   Thank you for allowing me to participate in [name] care.  I will be happy to assist further as needed, either from the office or  while on-call.      Melvyn Novas, M.D.  Electronically Signed     CD/MEDQ  D:  06/12/2008  T:  06/13/2008  Job:  [id number]   cc:   Salvatore Decent. Dorris Fetch, M.D.  [name]. [name], M.D.

## 2010-06-30 NOTE — Discharge Summary (Signed)
NAMEBRANTON, [name]              ACCOUNT NO.:  [id number]   MEDICAL RECORD NO.:  [id number]          PATIENT TYPE:  INP   LOCATION:  A217                          FACILITY:  [facility]   PHYSICIAN:  Dorris Singh, DO    DATE OF BIRTH:  1928/04/22   DATE OF ADMISSION:  03/20/2007  DATE OF DISCHARGE:  02/04/2009LH                               DISCHARGE SUMMARY   PRIMARY CARE PHYSICIAN:  Dr. [name].   The patient had a CT of the head without contrast on February 2 which  demonstrated no identifiable acute finding.  Old appearing thalamic  stroke on the left.  Also a portable chest x-ray on February 2 which  showed probable developing right infrahilar infiltrate.   ADMITTING DIAGNOSES:  1. Multiple episodes of seizure in a setting of a known seizure      disorder.  2. History of type 2 diabetes.  3. History of hypertension.   DISCHARGE DIAGNOSES:  1. Uncontrolled seizure activity.  Medical noncompliance with      medication.  2. Type 2 diabetes.  3. History of hypertension.   The patient's H and P was done by Dr. Osvaldo Shipper but to summarize  the patient is a 77 year old Caucasian male who presented with multiple  seizures over a course of 1 day.  His Dilantin level was super  therapeutic which is probably the reason for his seizures.  He had an  elevated white count and a low-grade temperature.  He was started on  Dilantin as well and he was started on ceftriaxone with blood cultures  sent out.  Also he has history of diabetes which they checked and placed  him on a sliding scale and also gave him Ativan in case he had any other  resistant seizures and for agitation.  The patient was monitored without  any incident.  He was seen by Dr. Gerilyn Pilgrim.  They ordered tests which  were followed.  Today he was seen asking to go home.  Apparently he has  a special needs wife and he takes care of her.  Currently his vitals are  stable.  His white count has decreased to 7.2.  Hemoglobin is  9.9.  Hematocrit is 27.9.  His chemistry is within normal limits except for an  elevated glucose at 147.  He was also found to have large blood in his  urine on February 2.  However, once he was started on antibiotic therapy  it seemed to clear.  His blood cultures were negative.  It was  determined at this time that the patient was stable enough after  discussion with Dr. Gerilyn Pilgrim who would like him to follow up with him  outpatient that he can be discharged to home to follow up with his  primary care physician and especially at the request of the patient  since he does want to go home, discussed with him risk versus benefit of  going home.  He understood but would like to go home and take care of  his wife.  So we will go ahead and discharge him to home on the  following medications:   DISCHARGE MEDICATIONS:  1. He will be sent home on metoprolol tartrate 100 mg once daily.  2. Diltiazem 10 mg three times a day.  3. Simvastatin 40 mg once a day.  4. Lisinopril 10 mg once a day.  5. Glyburide 5 mg twice a day.  6. Phenytoin 100 mg one in the morning and one in the p.m.  7. Metformin 1000 mg, this did not give a dose.  8. His new medication is Tegretol and has been increased to 400 mg      p.o. daily.  9. Also will send him home on Levaquin 500 mg p.o. x5 days due to his      elevated leukocytosis and improvement once antibiotic therapy was      instituted.  The patient does not have any allergies to this.   The patient is instructed to follow up with Dr. Gerilyn Pilgrim in 1 week and  also with Dr. [name] in 1 week as well and discuss with him the  importance of him following and taking his medication as prescribed or  he will have a repeat of this particular episode.  The patient stated  understanding of this and understands the risks and benefits if he does  not take his medication as recommended.      Dorris Singh, DO  Electronically Signed     CB/MEDQ  D:  03/22/2007  T:   03/22/2007  Job:  [id number]   cc:   [name], Dr

## 2010-06-30 NOTE — Discharge Summary (Signed)
[name], [name]              ACCOUNT NO.:  [id number]   MEDICAL RECORD NO.:  [id number]          PATIENT TYPE:  INP   LOCATION:  2002                         FACILITY:  [facility]   PHYSICIAN:  Gregory Gardener, MD    DATE OF BIRTH:  02/14/1929   DATE OF ADMISSION:  06/12/2008  DATE OF DISCHARGE:  06/14/2008                               DISCHARGE SUMMARY   DISCHARGE DIAGNOSIS:  1. Dilantin toxicity.  2. Ataxia secondary to Dilantin toxicity.  3. Seizure disorder.  4. Coronary artery disease.  5. Hypertension.  6. Hyperlipidemia.   DISCHARGE MEDICATIONS:  1. Dilantin 100 mg p.o. three times daily.  2. Norvasc 10 mg once a day.  3. Zocor 40 mg once a day.  4. Aspirin 325 mg once a day.  5. Ultram 50 mg as needed for pain.  6. Coreg 25 mg twice daily.  7. Tegretol 400 mg twice daily.  8. Glyburide 5 mg twice daily.  9. Lisinopril 40 mg once a day.  10.Metformin 1000 mg twice daily.  11.Protonix 40 mg once a day.   CONSULTATIONS:  None.   PROCEDURES:  None.   DIAGNOSTIC STUDIES:  1. Chest x-ray on April 28 showed no acute findings.  2. CT scan of the head without contrast on April 28 showed no acute      findings.   HOSPITAL COURSE:  This is a 77 year old male with known past medical  history of seizure disorder and he takes Dilantin and Tegretol at home.  He came to the hospital on April 28 complaining of unsteady gait  associated with slurred speech and dizziness.  A CT scan of the head was  done in the ER and came to be negative.  The patient was found to have a  high Dilantin level of 30.  The patient was admitted to the hospital for  further evaluation.  His Dilantin was initially held and then was  restarted in a lower dose.  The patient improved quick, and was back to  his baseline.  Dilantin level improved to 18 which is within normal  limits.  I reduced his Dilantin to 100 mg three times a day.  Today at  the time of discharge, the patient is stable and back to  his baseline  and denied any acute symptoms.  He will be discharged today and will  follow up with his primary doctor within a week to check his Dilantin  level to ensure that it will be within therapeutic.  I advised him not  to drive until reevaluated by his primary doctor and to  keep his appointment with his primary doctor and to switch his Dilantin  to the new prescribed dose.  Otherwise, other medical conditions  remained stable and the patient was continued on all other preadmission  medications.   Total assessment time is 40 minutes.      Gregory Gardener, MD  Electronically Signed     NAE/MEDQ  D:  06/14/2008  T:  06/14/2008  Job:  [id number]

## 2010-06-30 NOTE — H&P (Signed)
NAMEDEANDREW, Ayers              ACCOUNT NO.:  [id number]   MEDICAL RECORD NO.:  [id number]          PATIENT TYPE:  EMS   LOCATION:  MAJO                         FACILITY:  [facility]   PHYSICIAN:  Lonia Blood, M.D.DATE OF BIRTH:  04-06-1928   DATE OF ADMISSION:  06/12/2008  DATE OF DISCHARGE:                              HISTORY & PHYSICAL   PRIMARY CARE PHYSICIAN:  Dr. Benedetto Goad.   THORACIC SURGEON:  Dr. Dorris Fetch.   CHIEF COMPLAINT:  Unsteady gait slurred speech and dizziness.   PRESENT ILLNESS:  [name] is a very pleasant gentleman with a  known history of coronary artery disease status post MI.  He underwent a  three-vessel coronary artery bypass graft on April 22, 2008 per Dr.  Charlett Lango.  Postoperative course was primarily uneventful with  exception of a dizzy spell on April 24, 2008.  Full evaluation was  carried out at that time and the patient's symptoms resolved  spontaneously.  Of note, a Dilantin level was obtained at that time it  was actually found to be mildly subtherapeutic though approaching  therapeutic when corrected for borderline low albumin.  With the  patient's symptoms having completely resolved, the patient was  discharged home.  The patient has been without difficulty initially.  Over the last week, the patient reports he has had a gradually  progressive dizziness on standing with a stumbling gait.  He  specifically states these symptoms virtually disappear just prior to his  Dilantin dose but seem to recur approximately an hour and 2 hours after  his Dilantin dose with the maximum severity of their presentation  occurring at that time.  He states he stumbled about the house multiple  times.  He has not fallen, nor has he struck his head.  This morning, in  addition to these symptoms, the patient was found to be slurring his  speech by his family.  Out of significant concern for him, he was  brought to the emergency room.   Initially upon presentation a Code  Stroke was called.  CT scan of the head that did not reveal any acute  findings but did not raise the question of a tiny lacunar infarct in the  left thalamus that appears to be old.  A Dilantin level was assessed and  was found to be 23.9 which when corrected for albumin of 3.6 gives a  level of 29.  This is significantly elevated, though not markedly so.  The patient has already been seen by neurology in the emergency room  owing to the Code Stroke that was called.  A recommendation from  neurology is to simply hold the patient's p.m. Dilantin dose and resume  in the morning.  The patient is also on Tegretol and a Tegretol level  has not been ordered or obtained in the emergency room.   At time my evaluation, the patient specifically denies any chest pain,  shortness of breath, fevers, chills, nausea or vomiting.  He does report  has had poor appetite since that time of this surgery and has lost a few  pounds  over the last month as a result.   REVIEW OF SYSTEMS:  Comprehensive review of systems is unremarkable with  exception of the positive elements noted in history of present illness  above.   PAST MEDICAL HISTORY:  1. Coronary artery disease status post MI in 1970s with recurrent MI      in 2010 - status post 3-vessel coronary bypass graft April 22, 2008.  2. Seizure disorder - controlled with Tegretol and Dilantin.  3. Diabetes mellitus type 2.  4. Hypertension.  5. Hyperlipidemia.  6. Anxiety disorder.  7. Gastroesophageal reflux disease.  8. Prior history of tobacco abuse.  9. History of aspiration pneumonia with vent-dependent respiratory      failure.   OUTPATIENT MEDICATIONS:  1. Phenytoin EX 200 mg b.i.d.  2. Norvasc 10 mg daily.  3. Zocor 40 mg daily.  4. Aspirin 325 mg p.o. daily.  5. Ultram 50 mg p.r.n. pain.  6. Carvedilol 25 mg b.i.d.  7. Diazepam 10 mg p.r.n.  8. Epitol (Tergitol) 40 mg b.i.d.  9. Glyburide 5 mg b.i.d.   10.Lisinopril 40 mg daily.  11.Metformin 1000 mg b.i.d.  12.Protonix 40 mg daily.   ALLERGIES:  No known drug allergies.   FAMILY HISTORY:  Noncontributory.   SOCIAL HISTORY:  The patient does not currently drink or smoke.  He is  married.  He lives with his family in the [place] area.  He is  retired.   DATA REVIEW:  Hemoglobin is low at 11.5 with MCV 93, white count and  platelet count are normal.  INR normal.  Point of care cardiac markers  are negative x1.  Dilantin level is as discussed above.  Sodium,  potassium chloride, bicarb, BUN, creatinine normal.  Serum glucose  elevated at 243, calcium is normal.  LFTs are normal.  Albumin is 3.6.  CT scan of the head reveals atrophy, small-vessel disease, and a tiny  lacuna in the left thalamus which appears to be old, but there is no  acute disease otherwise appreciable.  EKG reveals no acute ST or T-wave  changes with a normal sinus rhythm.   PHYSICAL EXAMINATION:  VITAL SIGNS:  Temperature 96.8, blood pressure  145/62, heart rate 60, oxygen saturation 100% on room air, respiratory  rate 17.  GENERAL:  Well-developed, well-nourished gentleman in no acute  respiratory distress.  HEENT:  Normocephalic, atraumatic.  Pupils equal round and reactive to  light and accommodation.  Extraocular muscles intact bilaterally.  OC/OP  clear.  NECK:  No JVD whatsoever.  No lymphadenopathy, no thyromegaly.  LUNGS:  Clear to auscultation bilaterally without wheezes or rhonchi.  CARDIOVASCULAR:  Regular rhythm without gallop or rub.  Normal S1, S2.  ABDOMEN:  Nontender, nondistended, soft bowel sounds present.  No  organomegaly, rebound or ascites.  EXTREMITIES:  No significant cyanosis, clubbing, edema bilateral lower  extremities.  NEUROLOGIC:  Alert and oriented x4.  Cranial II-XII intact bilaterally,  5/5 strength bilateral upper and lower extremities.  Intact sensation  throughout.   IMPRESSION AND PLAN:  1. Gait ataxia, slurred  speech, and general dizziness - the patient's      correct Dilantin level is approximately 30.  Certainly this is a      high enough effective Dilantin level to causes deleterious side      effects.  It should be noted, however, that symptoms of Tegretol      toxicity also match the patient's presenting symptoms not in the      slightest.  As a result, we will check a stat Tegretol level as      well.  The patient will be gently hydrated.  To complete the      evaluation, we will obtain B12 or folic acid levels and TSH level      as well.  I will cycle cardiac enzymes given the patient's recent      cardiac surgery.  Furthermore, chest x-ray will be accomplished to      assure there are no pulmonary findings to explain the patient's      symptoms.  We will reassess a Dilantin level in the morning.  I      will actually elect to hold the patient's morning Dilantin as well      if his level has not significantly improved overnight.  2. Diabetes mellitus type 2 - the patient's CBG will be followed      closely.  In that I feel the patient's hospital stay will be short,      I will continue his metformin and Glucotrol.  If, however, the      patient's hospital stay appears to require additional days,      recommend discontinuing these medications and relying on Lantus and      sliding scale insulin solely.  3. Coronary artery disease status post coronary bypass graft last      month - the patient is suffering with no apparent complications      from this procedure.  We will continue his aspirin, beta blocker      and ACE inhibitor.  We will follow serial enzymes and place him on      tele to assure that an occult arrhythmia is not to blame for some      of his symptoms.  4. Hyperlipidemia - we will continue the patient's Zocor.  LFTs are      normal.      Lonia Blood, M.D.  Electronically Signed     JTM/MEDQ  D:  06/12/2008  T:  06/12/2008  Job:  [id number]   cc:   [name].  [name], M.D.  Salvatore Decent Dorris Fetch, M.D.

## 2010-06-30 NOTE — Discharge Summary (Signed)
[name], [name]              ACCOUNT NO.:  [id number]   MEDICAL RECORD NO.:  [id number]          PATIENT TYPE:  INP   LOCATION:  2507                         FACILITY:  [facility]   PHYSICIAN:  Veverly Fells. Excell Seltzer, MD  DATE OF BIRTH:  [birth year]   DATE OF ADMISSION:  04/15/2008  DATE OF DISCHARGE:  04/17/2008                               DISCHARGE SUMMARY   ADDENDUM   Please note that the patient was transferred to [facility] from  [place] on April 15, 2008, for cardiac catheterization and remained  at [facility] for the rest of his hospital stay.  He was  discharged from [facility] on April 17, 2008.      Jarrett Ables, [facility]      Veverly Fells. Excell Seltzer, MD  Electronically Signed    MS/MEDQ  D:  04/17/2008  T:  04/18/2008  Job:  [id number]   cc:   Veverly Fells. Excell Seltzer, MD

## 2010-06-30 NOTE — Consult Note (Signed)
[name], [name]              ACCOUNT NO.:  [id number]   MEDICAL RECORD NO.:  [id number]          PATIENT TYPE:  INP   LOCATION:  2507                         FACILITY:  [facility]   PHYSICIAN:  Salvatore Decent. Dorris Fetch, M.D.DATE OF BIRTH:  07/24/1928   DATE OF CONSULTATION:  04/15/2008  DATE OF DISCHARGE:                                 CONSULTATION   REASON FOR CONSULTATION:  Two-vessel coronary artery disease.   HISTORY OF PRESENT ILLNESS:  [name] is a 77 year old gentleman with  a history of coronary artery disease dating back over 30 years.  He also  has hypertension, hyperlipidemia, and type 2 diabetes.  He presented on  April 09, 2008, with a seizure.  He actually had 2 witnessed  seizures.  He required intubation for airway protection.  He also spiked  temperature.  He was treated for presumptive septic shock and systemic  inflammatory syndrome.  He required Levophed and vasopressin for  hypotension.  He, during this, had cardiac enzymes checked and his  troponin was elevated at 0.64 and CK-MB was elevated at 11.5.  He was  seen in consultation by Cardiology.  He was loaded with Plavix.  Today,  he underwent cardiac catheterization and was found to have significant  two-vessel disease with a 90% ostial right coronary stenosis and 80%  circumflex stenosis.  His LAD did not have any significant disease.  There was possibly a 50% narrowing in a bifurcated second diagonal.  The  patient currently is pain free.  He denies any chest pain or shortness  of breath leading up to his seizure event.   PAST MEDICAL HISTORY:  Significant for,  1. Seizure disorder, recent seizure likely secondary to low Dilantin      level.  2. Type 2 non-insulin-dependent diabetes.  3. Hypertension.  4. Hyperlipidemia.  5. Coronary artery disease with MI at age 62.  6. Anxiety disorder.  7. Tremor.  8. Gastroesophageal reflux.  9. Medical noncompliance.  10.Recent ventilator-dependent  respiratory failure.  11.Recent acute renal failure, resolved.   HOME MEDICATIONS:  1. Diazepam 5 mg p.r.n.  2. Carbamazepine 4 mg b.i.d.  3. Phenytoin 300 mg daily.  4. Simvastatin 40 mg daily.  5. Metoprolol XL 100 mg daily.  6. Glyburide 5 mg b.i.d.  7. Metformin 1000 mg b.i.d.  8. Lisinopril 20 mg daily.  9. Meloxicam 15 mg daily.   There are no known drug allergies.   FAMILY HISTORY:  Significant for coronary artery disease.   SOCIAL HISTORY:  The patient lives with his wife and daughter.  He did  chew tobacco.  He is nonsmoker.  He has remote history of alcohol abuse,  but has not had in many years.  He used to work in [employer], but is retired.   REVIEW OF SYSTEMS:  The patient was feeling well other than cough and  sore throat prior to admission.  He denies any chest pain, shortness of  breath, paroxysmal nocturnal dyspnea, orthopnea, or peripheral edema.  No history of stroke or TIA.   PHYSICAL EXAMINATION:  GENERAL:  [name] is a  77 year old gentleman  in no acute distress.  He is well developed and well nourished.  He is  alert and oriented x3 with no focal deficits.  HEENT:  Remarkable for some periorbital ecchymosis and other ecchymoses  are present on the face as well.  NECK:  Supple without thyromegaly, adenopathy, or bruits.  CARDIAC:  Regular rate and rhythm.  Normal S1 and S2.  No rubs, murmurs,  or gallops.  LUNGS:  Clear with equal breath sounds bilaterally.  EXTREMITIES:  Multiple ecchymoses.  ABDOMEN:  Soft and nontender.  He has 2+ pulses throughout.   LABORATORY DATA:  His hematocrit is 36, white count 8.6, and platelets  152.  Sodium 141, potassium 3.7, BUN and creatinine are 9 and 0.91, and  glucose is 162.  PT on admission was 13.3 and PTT 32.  LFTs within  normal limits.  Albumin is 2.6.  Cholesterol 170, LDL is 106, and HDL is  42.  Urinalysis shows hematuria.  Echocardiogram showed an ejection  fraction of 55% with some left  ventricular hypertrophy.  Cardiac  catheterization today shows 90% ostial right coronary in the large  dominant vessel and an 80% stenosis in the circumflex.   IMPRESSION:  [name] is a 77 year old gentleman who presented with  seizures.  This was accompanied by shock and in that setting he had a  non-Q-wave myocardial infarction.  He was asymptomatic prior to  admission.  At catheterization, he has a 90% ostial right stenosis and  80% stenosis in the circumflex leading into the only significant obtuse  marginal branch.  These vessels are not amenable to percutaneous  intervention.  Therefore, we have the option of treatment with  surgically or with medical therapy.  I did discuss with the patient  indications, risks, benefits, and alternative treatments.  He  understands that coronary artery bypass grafting does have a risk of  death, stroke, myocardial infarction, deep vein thrombosis, pulmonary  embolism, bleeding, possible need for transfusions, infection as well as  other organ system dysfunction including respiratory, renal, or  gastrointestinal complications.  He is interested in surgery; however,  he did receive a loading dose as well as subsequent doses of Plavix.  There is no urgency to his surgery at this point in time.  Therefore, I  would recommend allowing Plavix washout prior to proceeding with  coronary artery bypass grafting if the patient does ultimately choose to  opt for that.  We will tentatively schedule him for next Monday, April 22, 2008, which will allow Plavix washout.      Salvatore Decent Dorris Fetch, M.D.  Electronically Signed     SCH/MEDQ  D:  04/15/2008  T:  04/16/2008  Job:  [id number]   cc:   Marca Ancona, MD  Everardo Beals. Juanda Chance, MD, [facility]

## 2010-06-30 NOTE — Consult Note (Signed)
NAMETEDRIC, LEETH              ACCOUNT NO.:  [id number]   MEDICAL RECORD NO.:  [id number]          PATIENT TYPE:  INP   LOCATION:  1228                         FACILITY:  [facility]   PHYSICIAN:  Everardo Beals. Juanda Chance, MD, FACCDATE OF BIRTH:  21-Nov-1928   DATE OF CONSULTATION:  04/12/2008  DATE OF DISCHARGE:                                 CONSULTATION   We were asked to consult on this 77 year old Caucasian gentleman who  initially presented to [facility] status post seizure with  repeated episodes while in the emergency room.  He was intubated for his  airway protection.  The patient had complications including hypotension.  He was started on Levophed and vasopressin.  He spiked a temperature of  102.  He was treated for septic shock/SIRS.  Head CT was negative for  acute findings.  The patient also experienced acute renal failure with  creatinine bump to 1.73, but stabilized.  Cardiac enzymes were obtained  on the 24th.  Showed a troponin of 0.64, CK-MB 11.5.  Followup enzymes  showed a troponin of 0.37, CK-MB of 7.4.  We were not asked to consult  at that time.  Two days later we are asked to consult for risk  management in the setting of elevated enzymes.  The patient's seizures  were thought to be secondary to subtherapeutic Dilantin level.  On  February 25, the patient was extubated.  Blood cultures were negative.  He had positive sputum culture.  Last EKG I was able to locate in the  chart was done on February 24.  He was sinus rhythm without acute ST or  T-wave changes.  The patient also had a 2-D echocardiogram done on  February 24 that showed an EF 55% with left ventricular wall thickness  mildly increased.  It was a technically difficult study.  In talking  with patient, he apparently had a myocardial infarction approximately 35  years ago.  He states he had a cardiac catheterization done at [facility] at that time.  Unclear as to what the diagnosis was.  However,  patient is not currently on any aspirin nor was he on aspirin prior to  this hospitalization while at home.  He has a sedentary lifestyle.  He  denies any episodes of chest discomfort or shortness of breath prior to  his hospitalization.  He states he had a stress test done years ago.  Cannot recall where it was done or why.   PAST MEDICAL HISTORY:  1. Seizures treated with Dilantin.  2. Type 2 diabetes.  3. Hypertension.  4. Medical noncompliance.  5. Chronic anxiety/affective disorder.  6. Dyslipidemia.  7. Questionable coronary artery disease.  8. MI at age 13 per patient's report.  9. Right inguinal hernia surgery repair.   REVIEW OF SYSTEMS:  Positive for cough.  Positive for sore throat.  All  systems reviewed.  Otherwise, negative or as stated in history of  present illness.   ALLERGIES:  No known drug allergies.   MEDICATIONS:  1. Metoprolol 50 mg b.i.d.  2. Simvastatin 40.  3. Metformin 1000 mg b.i.d.  4. Glyburide 5 mg b.i.d.  5. Dilantin 200 mg b.i.d.  6. Protonix 40.  7. Tegretol 400 mg b.i.d.   SOCIAL HISTORY:  The patient lives with his wife in [place].   PRIMARY CARE PHYSICIAN:  Dr. Benedetto Goad.   He does chew tobacco.  Denies any ETOH use, although in talking with him  earlier he states he had been out drinking with his friends on the night  that he had his seizure.  He is retired from [employer].   FAMILY HISTORY:  Father had coronary artery disease and hypertension.   LABORATORY WORK:  Troponin 0.64, 0.37 on April 10, 2008.  CK-MB 11.5  and 7.4, respectively.  H and H 10.8 and 31.5, respectively.  WBC 7, platelets 118,000.  Sodium 141, potassium 3.5, BUN 17, creatinine  1.26, glucose 168.  Recent EKG is pending.  Chest x-ray showing  bilateral alveolar/interstitial opacities persistent without marked  change.   PHYSICAL EXAMINATION:  The patient has been afebrile x24 hours, blood  pressure currently 170/87, heart rate 88, respirations 20.   Mr. Shurley is in no acute distress.  He is sitting up in chair, up ad  lib in the room.  S1, S2 regular rhythm, irregular at times.  Coarse rhonchi throughout.  ABDOMEN:  Distended, soft, positive bowel sounds.  LOWER EXTREMITIES:  Without clubbing, cyanosis or edema.  NEUROLOGICAL:  Alert and oriented x3.   A patient with non-ST elevated MI in the setting of SIRS/seizure  activity.  The patient with reported MI, heart catheterization  approximately 35 years ago, no records available.  Will check another  set of cardiac enzymes to confirm patient is trending down.  Obtain a 12-  lead EKG.  Will need to start low dose aspirin, and load with Plavix.  Continue beta blocker.  The patient will need cardiac catheterization  when stable.  Will continue to monitor, and tentatively plan for cardiac  catheterization early next week.  Dr. Charlies Constable has been in to  examine and assess patient, and agrees with plan of care.      Dorian Pod, ACNP      Bruce R. Juanda Chance, MD, [facility]  Electronically Signed    MB/MEDQ  D:  04/12/2008  T:  04/13/2008  Job:  [id number]

## 2010-06-30 NOTE — Discharge Summary (Signed)
Gregory, [name]              ACCOUNT NO.:  [id number]   MEDICAL RECORD NO.:  [id number]          PATIENT TYPE:  INP   LOCATION:  2015                         FACILITY:  [facility]   PHYSICIAN:  Salvatore Decent. Dorris Fetch, M.D.DATE OF BIRTH:  03/09/1928   DATE OF ADMISSION:  04/22/2008  DATE OF DISCHARGE:                               DISCHARGE SUMMARY   FINAL DIAGNOSIS:  Severe two-vessel coronary artery disease, status non-  Q-wave myocardial infarction.   IN-HOSPITAL DIAGNOSES:  1. Acute blood loss anemia postoperatively.  2. Postoperative volume overload.   SECONDARY DIAGNOSES:  1. History of seizure disorder.  2. Type 2 non-insulin-dependent diabetes mellitus.  3. Hypertension.  4. Hyperlipidemia.  5. History of coronary artery disease, status post myocardial      infarction at age 35.  27. Anxiety disorder.  7. History of tremors.  8. Gastroesophageal reflux disease.  9. Ventilator-dependent respiratory failure.  10.Recent acute renal failure, which resolves.   IN-HOSPITAL OPERATIONS AND PROCEDURES:  Coronary artery bypass grafting  x3 using a right internal mammary artery to right coronary artery,  saphenous vein graft to inferior branch of first diagonal, saphenous  vein graft to obtuse marginal 1.  Endoscopic vein harvesting from right  thigh was done.   PATIENT'S HISTORY AND PHYSICAL AND HOSPITAL COURSE:  [name] is a 77-  year-old gentleman who was recently admitted with ongoing seizures.  He  developed respiratory failure, but recovered quickly.  During that time  who ruled in for non-Q-wave myocardial infarction.  After recovering, he  was slow with Plavix and then underwent cardiac catheterization.  This  showed significant two-vessel coronary artery disease.  Following  catheterization, Dr. Dorris Fetch was consulted.  Dr. Dorris Fetch saw and  evaluated the patient.  He discussed with the patient to undergoing  coronary artery bypass grafting.  Risks and  benefits were discussed.  The patient also understand and agreed to proceed.  Surgery was  scheduled for April 22, 2008.  For details of the patient's past medical  history and physical exam, please see dictated H and P.   The patient was taken to the operating room on April 22, 2008 where he  underwent coronary artery bypass grafting x3 using a right internal  mammary artery to right coronary artery, saphenous vein graft to  inferior branch of first diagonal, saphenous vein graft to obtuse  marginal 1.  Endoscopic vein harvest from right thigh was done.  The  patient tolerated this procedure well and transferred to the intensive  care unit in stable condition.  Postoperatively, the patient was noted  to be hemodynamically stable.  He was extubated in the surgery.  Postextubation, the patient noted to be alert and oriented x4.  Neuro  intact.  The patient's postoperative course was pretty much  unremarkable.  On postop day #1 in the intensive care unit, the patient  was noted to be in sinus rhythm.  Blood pressure was stable.  All drips  were able to be weaned and discontinued.  Chest x-ray was stable.  He  had minimal drainage from chest tubes and chest tubes discontinued in  normal fashion.  The patient's creatinine was noted to be stable at  1.01.  His hemoglobin and hematocrit were stable.  The patient was up,  ambulating well with cardiac rehab.  He was felt to be stable and  transfer out to [facility].  Mom report the patient continued to progress  well.  He was restarted on his blood pressure medications, Coreg,  lisinopril, and Norvasc.  Blood pressures were monitor and remained  stable.  The patient remained in normal sinus rhythm.  He did have some  mild volume overload and was started on diuretics postoperatively.  Daily weights obtained and the patient's volume overload was improving.  He remained 2.5 kg above his preoperative state currently.  The patient  also had some acute blood  loss anemia, which remained stable.  Most  recent hemoglobin, hematocrit was 10 and 30% postop day #2.  The patient  continued to progress well.  He continued to ambulate with cardiac rehab  without difficulty.  He was tolerating diet well.  No nausea, vomiting  noted.  All incisions noted to be clean, dry and intact, and healing  well.  The patient is diabetic and blood sugars were followed closely.  He was restarted on his glyburide.  Currently blood sugars are stable  and we will plan to restart metformin as outpatient as needed for  increasing blood sugars.   On postop day #3, the patient is noted to be afebrile.  He is in normal  sinus rhythm.  He is saturating greater than 90% on room air.  Most  recent lab work shows white blood cell count of 11.3, hemoglobin of 10,  hematocrit 30, platelet count of 212.  Sodium of 135, potassium 4.3,  chloride of 103, bicarbonate 25, BUN of 11, creatinine 0.91, glucose of  99.  The patient is felt to be stable and ready for discharge home  today, postop day #3, April 25, 2008.   FOLLOWUP APPOINTMENTS:  Followup appointment has been arranged with Dr.  Dorris Fetch for May 24, 2008 at 12:15 p.m.  The patient will need to  obtain a PA and lateral chest x-ray 30 minutes prior to this  appointment.  The patient will need to follow up Dr. Juanda Chance in 2 weeks.  He will need to contact his office to make these arrangements.  The  patient also need to follow up with his primary care physician in next 1-  2 weeks for diabetes management.   ACTIVITY:  The patient instructed no driving to released to do so, no  lifting over 10 pounds.  He is told to ambulate 3-4 times per day,  progress as tolerated and continue his breathing exercises.   INCISIONAL CARE:  The patient is told to shower, washing his incisions  using soap and water.  He is to contact the office if he develops any  drainage or opening from any of his incision sites.   DIET:  The patient  educated on diet to be low-fat, low-salt, diabetic  diet.   DISCHARGE MEDICATIONS:  1. Aspirin 325 mg daily.  2. Coreg 25 mg b.i.d.  3. Lisinopril 20 mg daily.  4. Simvastatin 40 mg at night.  5. Penicillin 200 mg b.i.d.  6. Glyburide 5 mg b.i.d.  7. Epitol 400 mg b.i.d.  8. Lasix 40 mg daily x5 days.  9. Potassium chloride 20 mEq daily x5 days.  10.Norvasc 5 mg daily.  11.Protonix 40 mg daily.  12.Ultram 50 mg 1-2 tablets q.4-6 h.  p.r.n.  13.Currently holding the patient's metformin due to blood sugar being      stable on glyburide.  The patient is to continue monitoring his      blood sugars and if continued to increase, he is to restart his      metformin.      Theda Belfast, PA      Salvatore Decent. Dorris Fetch, M.D.  Electronically Signed    KMD/MEDQ  D:  04/25/2008  T:  04/25/2008  Job:  [id number]   cc:   Everardo Beals. Juanda Chance, MD, [facility]

## 2010-06-30 NOTE — H&P (Signed)
[name], [name]              ACCOUNT NO.:  [id number]   MEDICAL RECORD NO.:  [id number]          PATIENT TYPE:  INP   LOCATION:  0101                         FACILITY:  [facility]   PHYSICIAN:  Leslye Peer, MD    DATE OF BIRTH:  12-23-1928   DATE OF ADMISSION:  04/09/2008  DATE OF DISCHARGE:                              HISTORY & PHYSICAL   CHIEF COMPLAINT:  Seizures.   BRIEF HISTORY:  [name] is a 77 year old man with a history of  diabetes mellitus, hypertension and a seizure disorder.  He apparently  had two witnessed seizures at home this evening and he fell and struck  his head during the second event.  He was noted to be significantly post  ictal, lethargic and confused after the seizures, and his family  mobilized EMS, and he was brought to the [facility] emergency  department for evaluation.  While he was in the ER, he had a another  seizure and was deemed unable to protect his airway and was  endotracheally intubated.  His subsequent evaluation revealed a normal  head CT with the exception of some stable chronic atrophy.  He also had  a C-spine CT scan performed in order to evaluate him post trauma.  This  was a normal exam and his C-collar was removed.  His laboratory  evaluation reveals subtherapeutic Dilantin level, hyperkalemia and a  mixed acidosis.  Critical care medicine was asked to evaluate and admit.   PAST MEDICAL HISTORY:  1. Seizure disorder.  2. Diabetes mellitus type 2.  3. Hypertension.  4. Hyperlipidemia.  5. Coronary artery disease status post MI at age 75.  19. Anxiety disorder.  7. Tremor  8. GERD.  9. Medical noncompliance.   ALLERGIES:  No known drug allergies.   HOME MEDICATIONS.:  1. Diazepam 5 mg p.r.n.  2. Carbamazepine 4 mg b.i.d.  3. Simvastatin 40 mg daily.  4. Metoprolol XL 100 mg daily.  5. Metoclopramide 5-10 mg p.r.n.  6. Phenytoin 300 mg daily.  7. Glyburide 5 mg b.i.d.  8. Metformin 1000 mg b.i.d.  9. Lisinopril 20  mg daily.  10.Meloxicam 15 mg daily.   SOCIAL HISTORY:  The patient lives with his wife and daughter, he is a  never smoker, but he did used to chew tobacco.  He has a remote alcohol  history, but has not had alcohol for many years.  He is retired and used  to [employer] and work in [employer].   FAMILY HISTORY:  Coronary artery disease and hypertension.   PHYSICAL EXAMINATION:  GENERAL:  This is a diaphoretic sedated elderly  man.  VITAL SIGNS:  He is afebrile.  His blood pressure is 190/100, heart rate  is 130, respiratory rate 14, SPO2 90% on 70% FIO2.  HEENT:  Endotracheal  tube is in good position.  His pupils are equal, round, react to light.  NECK:  Without lymphadenopathy.  LUNGS:  Coarse on the right both on inspiration and expiration and left  side is clear.  HEART:  Tachycardiac without murmur.  ABDOMEN:  Soft, benign with  positive bowel sounds.  EXTREMITIES:  No cyanosis, clubbing or edema.  NEUROLOGIC:  He is sedated and paralyzed.   LABORATORY EVALUATION:  ABG on 100% FIO2 and PEEP of 5 was  7.21/46/99/17.7.  INR 1.0.  UA 1.020, pH 5.5, glucose 500, trace ketones  and moderate blood, protein of 100.  Dilantin level was 3.1, salicylate  less than 4.0.  Alcohol five pneumonia 18.  Tylenol less than 10 white  blood cell count 15 with 89% neutrophils, 5% lymphocytes, 6% monocytes,  hematocrit 37.5, platelets 277.  Sodium 134, potassium 5.7, chloride  102, CO2 22, BUN 18, creatinine 1.24 which is up from 1.04 one year ago,  glucose 258, calcium 8.8, alkaline phosphatase 91, AST 33, ALT 21, total  protein 6.7, albumin 3.9.  Chest x-ray in the emergency department  showed endotracheal tube in good position 5 cm above the main carina.  There is an evolving subtle right-sided infiltrate.  The left side is  clear.   IMPRESSION AND PLAN:  1. Seizure disorder in the setting of a low Dilantin level.      Carbamazepine level is not yet known.  I suspect that  medical      noncompliance is the cause of his poor seizure control.  He will be      loaded on Dilantin and restarted on his carbamazepine and/or      neurology evaluation will be initiated if his seizures recur on his      usual medical regimen.  2. VDRF.  Sedation will be initiated per protocol, and he will be      assessed for SVT in the morning if his gas exchange improves.  I      will hold his blood pressure regimen while he is on sedation and      this will be restarted when he is allowed to wake up  3. Right sided aspiration pneumonia, blood cultures will be obtained      as will a bronchoalveolar lavage for bacterial culture.  I will      start azithromycin and Unasyn given his probable aspiration.  These      may be stopped in the next few days depending on the evolution of      his infiltrate and his culture results.  4. Diabetes mellitus.  I will hold his oral agents, especially his      metformin given his mild acidosis.  We will use the ICU      hyperglycemia protocol.  5. Renal insufficiency.  His serum creatinine is 1.24.  We will follow      this serially.  6. Hyperkalemia.  Kayexalate 30 g times one will be given.  We will      follow his BMP  7. Mild metabolic acidosis superimposed on a mild respiratory      acidosis.  I will check serum ketones and lactate.  A total CPK is      pending.  His salicylate level was negative as mentioned.  8. Prophylaxis will be with SCDs and Protonix.      Leslye Peer, MD  Electronically Signed     RSB/MEDQ  D:  04/09/2008  T:  04/10/2008  Job:  [id number]

## 2010-06-30 NOTE — Op Note (Signed)
[name], [name]              ACCOUNT NO.:  [id number]   MEDICAL RECORD NO.:  [id number]          PATIENT TYPE:  INP   LOCATION:  2302                         FACILITY:  [facility]   PHYSICIAN:  Salvatore Decent. Dorris Fetch, M.D.DATE OF BIRTH:  [birth year]   DATE OF PROCEDURE:  04/22/2008  DATE OF DISCHARGE:                               OPERATIVE REPORT   PREOPERATIVE DIAGNOSIS:  Severe 2-vessel coronary disease, status post  non-Q-wave myocardial infarction.   POSTOPERATIVE DIAGNOSIS:  Severe 2-vessel coronary disease, status post  non-Q-wave myocardial infarction.   PROCEDURE:  Median sternotomy; extracorporeal circulation coronary  artery bypass grafting x3 (right internal mammary artery to right  coronary, saphenous vein graft to inferior branch of first diagonal,  saphenous vein graft to obtuse marginal one); endoscopic vein harvest,  right thigh.   SURGEON:  Salvatore Decent. Dorris Fetch, MD   ASSISTANT:  Rowe Clack, PA-C   ANESTHESIA:  General.   FINDINGS:  Diagonal and OM, small fair quality targets; right coronary,  large good-quality target, good-quality conduits.   CLINICAL NOTE:  [name] is a 77 year old gentleman who was recently  admitted with ongoing seizures.  He developed respiratory failure, but  recovered quickly.  During that time, he ruled in for non-Q-wave  myocardial infarction.  After recovering, he was loaded with Plavix and  then underwent cardiac catheterization, which showed significant 2-  vessel coronary disease involving the ostium of the right coronary as  well as an 80% stenosis in obtuse marginal 1.  There was approximately  50% stenosis in the inferior branch and the first diagonal branch of the  LAD, but no significant LAD disease.  The patient was advised to undergo  coronary artery bypass grafting hence these lesions were amenable to  percutaneous intervention.  The indications, benefits, and alternatives  were discussed in detail with the  patient.  He understood and accepted  the risks and agreed to proceed.   OPERATIVE NOTE:  [name] was brought to the preop holding area on  April 22, 2008.  There, the anesthesia service under direction of Dr. Bedelia Person placed lines for monitoring arterial central venous and pulmonary  arterial pressure.  Intravenous antibiotics were administered, he was  taken to the operating room, anesthetized and intubated.  A Foley  catheter was placed.  The chest, abdomen, and legs were prepped and  draped in usual sterile fashion.  A median sternotomy was performed and  the right internal mammary artery was harvested using standard  technique.  Simultaneously, incision was made in the medial aspect of  the right leg at the level of the knee and the greater saphenous vein  was harvested endoscopically.  Heparin 2000 units was administered  during vessel harvest, remaining full heparin dose was given prior to  opening the pericardium.   The pericardium was opened.  The ascending aorta was inspected.  There  were some mild adhesions of the pericardium to the aorta.  Aorta itself  was without palpable plaque.  The aorta was cannulated via concentric 2-  0 Ethibond pledgeted pursestring sutures.  A dual stage venous cannula  was placed via pursestring suture in the right atrium.  Cardiopulmonary  bypass was instituted and the patient was cooled to 32 degrees Celsius.  The coronary arteries were inspected and anastomotic sites were chosen.  The conduits were inspected and cut to length.  A foam pad was placed in  the pericardium to insulate the heart, protect the left phrenic nerve  and temperature probe was placed in myocardial septum.  A cardioplegic  cannula placed in the ascending aorta.   The aorta was cross-clamped, the left ventricle was emptied via the  aortic root vent.  Cardiac arrest was then achieved with a combination  of cold antegrade blood cardioplegia and topical iced saline.   There was  a rapid diastolic arrest and myocardial septal cooling to 9-degree  Celsius.  Following distal anastomoses were performed.   First, a reverse saphenous vein graft was placed end-to-side to the  inferior branch of the first diagonal.  This was approximately 1.3 mm  vessel had accepted a 1.0 mm probe, but not a 1.5-mm probe.  It was fair-  quality and thin-walled.  The vein graft was of good quality, it was  anastomosed end-to-side with a running 7-0 Prolene suture.   At the completion of each vein graft, the probe was placed proximally  and distally prior to tying the sutures and then cardioplegia was  administered to assess flow and hemostasis.   Next, a reverse saphenous vein graft was placed end-to-side to obtuse  marginal one; this was a 1.5-mm vessel, but was also thin-walled,  normally fair-quality.  Vein graft was anastomosed with a running 7-0  Prolene suture.  Probe was passed easily.  There was good flow and good  hemostasis.   Next, the right internal mammary artery was brought through an incision  in the right pericardium.  The distal end was beveled, it was then  anastomosed to the mid right coronary artery with a running 8-0 Prolene  suture.  Right coronary artery was a 2.5-mm good-quality target vessel.  The mammary was a 2-mm good-quality conduit.  The anastomosis was  performed with a running 8-0 Prolene suture.  At the completion of the  anastomosis, bulldog clamps were briefly removed to inspect for  hemostasis, then it was replaced and the mammary pedicle was tacked to  the epicardial surface of the heart with 6-0 Prolene sutures.   Additional cardioplegia was administered.  The cardioplegic cannula then  was removed from the ascending aorta.  The vein grafts were cut to  length and the proximal vein graft anastomosed.  These were performed to  4.5 mm punch aortotomies with running 6-0 Prolene sutures.  At the  completion of the final proximal  anastomosis, the patient was placed in  Trendelenburg position, Lidocaine was administered, and the bulldog  clamp was removed from the right mammary artery.  The aortic root was de-  aired and the aortic crossclamp was removed.  The total crossclamp time  was 56 minutes.  The patient was initially in heart block, he did not  require defibrillation.   This patient was being rewarmed, all proximal and distal anastomoses  were inspected for Hemostasis.  Epicardial pacing wires were placed on  the right ventricle and right atrium.  When the patient had rewarmed to  a core temperature of 37-degree Celsius, he was weaned from  cardiopulmonary bypass.  On the first attempt, he was DDD paced, but on  no inotropic support.  The total bypass time was 89 minutes.  Initial  cardiac index was greater than 2 L/min/m2.  The patient had transient  hypertension with initial administration of protamine, but remained  hemodynamically stable thereafter.  A test dose of protamine was  administered.  There was transient hypotension, which resolved with  volume administration.  The remainder of the protamine was administered  without incident.  The atrial and aortic cannulae were removed.  The  superior aspect of the pericardium was reapproximated over the ascending  aorta and vein grafts with interrupted 3-0 silk sutures.  The  mediastinal tube and right pleural tubes were placed through separate  subcostal incisions.  The chest was irrigated with 1 L of warm saline.  Hemostasis was achieved.  The sternum was closed with interrupted heavy  gauge stainless steel wires.  There was no hemodynamic  change with closure of the sternum.  The remainder of the incision was  closed in standard fashion.  All sponge, needle, and instrument counts  were correct at the end of the procedure.  There were no intraoperative  complications.  The patient was taken from the operating room to the  surgical intensive care unit in  good condition.      Salvatore Decent Dorris Fetch, M.D.  Electronically Signed     SCH/MEDQ  D:  04/22/2008  T:  04/23/2008  Job:  [id number]

## 2010-06-30 NOTE — Discharge Summary (Signed)
[name], [name]              ACCOUNT NO.:  [id number]   MEDICAL RECORD NO.:  [id number]           PATIENT TYPE:   LOCATION:                                 FACILITY:   PHYSICIAN:  Veverly Fells. Excell Seltzer, MD  DATE OF BIRTH:  10-02-1928   DATE OF ADMISSION:  DATE OF DISCHARGE:                               DISCHARGE SUMMARY   PRIMARY CARDIOLOGIST:  Everardo Beals. Juanda Chance, MD, [facility]   PRIMARY INTERVENTIONAL CARDIOLOGIST:  Marca Ancona, MD   CARDIOTHORACIC SURGEON:  Salvatore Decent. Dorris Fetch, MD   DISCHARGE DIAGNOSIS:  Non-ST-elevation myocardial infarction in the  setting of systemic inflammatory response syndrome/seizure activity.   SECONDARY DIAGNOSES:  1. Seizure disorder.  2. VDRF.  3. Right-sided aspiration pneumonia.  4. Diabetes mellitus.  5. Renal insufficiency.  6. Hypotension.  7. Chronic anxiety/affective disorder.  8. Dyslipidemia.  9. Medical noncompliance.   ALLERGIES:  No known drug allergies.   PROCEDURES PERFORMED DURING THIS HOSPITALIZATION:  1. EKG on April 09, 2008, shows sinus tachycardia with a left      bundle-branch block with a ventricular rate of 118 beats per      minute, left axis deviation.  No acute ST - T-wave changes.  No      significant Q waves.  PR 152, QRS 140, QTC 515.  2. The patient had a chest x-ray on April 09, 2008, which showed      patchy infiltrates in both lower lobes, consistent with pneumonia.      Aspiration is possible given the history of seizure.  3. The patient had a CT of C-spine without contrast on April 09, 2008, that showed no acute finding.  Ordinary spondylosis from C3      to C7.  4. The patient had a CT of head without contrast on April 09, 2008,      which showed old left thalamic stroke.  No acute or reversible      process was evident.  5. The patient had a second chest x-ray on April 09, 2008, that      showed satisfactory positioning of endotracheal tip up the carina.      Persistent right lung  airspace disease, worrisome for pneumonia.  6. The patient had chest x-ray on April 10, 2008, that showed low      endotracheal tube, now about 15 mm above carina.  This could be      retracted 2 cm.  Asymmetric increased perihilar airspace disease      versus aspiration or pneumonia, worse on the right.  7. Another chest x-ray performed on April 10, 2008, right IJ      central venous catheter tip in mid SVC.  No pneumothorax.      Endotracheal tip now 5 cm above carina.  Some improvement in      aeration and decreasing probable edema pattern.  8. The patient had an x-ray of the abdomen on April 10, 2008, which      showed OG tube is coiled in the fundus of the stomach.  9. The patient had an  EKG that was performed on April 10, 2008,      that showed normal sinus rhythm with a rate of 79 beats per minute.      No acute ST-T-wave changes.  Q waves in lead III, minimal in lead      aVF.  No evidence of hypertrophy, normal axis.  PR 200, QRS 96, QTC      587.  10.The patient had an EKG on April 12, 2008, that showed normal      sinus rhythm with possible sinus arrhythmia, a ventricular rate of      78 beats per minute, no acute ST-T-wave changes, Q waves in lead      III, minimal Q waves in aVF, no evidence of hypertrophy, normal      axis, PR 182, QRS 100, QTC 449.  11.The patient had a chest x-ray on April 12, 2008, that showed      status post extubation, right IJ catheter has been pulled back with      tip now at the junction of the right internal jugular and      subclavian veins.  No change in the pulmonary edema.  12.The patient had another chest x-ray on April 13, 2008, that      showed improved aeration, especially in the right lung.  Residual      atelectasis/infiltrates at both lung bases.  13.A cardiac catheterization that showed:  Hemodynamics:  1. Left ventricular ejection fraction estimated at 60% with no wall      motion abnormalities.  2. Aorta  139/51, LV 139/16/21 and a left ventricular ejection fraction      estimated at 60% and no wall motion abnormalities noted.  3. RCA is a large vessel showing diffuse luminal irregularities in the      body, short ostial 90% stenosis.  There did appear to be true osteo      atherosclerotic lesion and not chest catheter induced spasm.  Left      main is free of significant disease.  LAD is a relatively small      artery compared to the RCA.  The body of the LAD had some luminal      irregularities.  There is a second diagonal branch that branches      into a superior and inferior division.  The inferior division has      approximately 50% stenosis.  The left circumflex has an 80%      proximal stenosis, just after a small first obtuse marginal.  At      the proximal end of this 80% lesion, the circumflex turned into an      almost 90-degree angle.  4. The patient had an EKG performed on April 16, 2008, that showed      sinus rhythm with infrequent PACs, ventricular rate of 81 beats per      minute, no acute ST-T-wave changes, Q waves in leads V3, normal      axis, no evidence of hypertrophy, PR 160, QRS 94, QTC 490.   1. A 2-D echocardiogram which showed LVEF estimated to be 55%.  Study      was inadequate for eval of LV regional wall motion.  LV wall      thickness mildly increased.  Aortic valve thickness mildly      increased.  Estimated peak pulmonary artery systolic pressure      mildly increased.  2. On April 16, 2008, the last procedure performed was 3  CABG, Dopplers      performed that showed no ICA stenosis bilaterally.  Bilateral      pulmonary arch signal diminished is greater than 50% with radial      compression, remains normal with ulnar compression and bilateral      ABIs and Doppler wave forms within normal limits.   HISTORY OF PRESENT ILLNESS:  The patient is a 77 year old male with a  history of diabetes mellitus, hypertension, questionable history of CAD,  and seizure  disorder.  He had 2 episodes of seizure that were witnessed  at his home on the evening of admission.  During the second episode of  seizure, he hit his head.  He was noted to be significantly postictal,  lethargic, and confused after the seizures.  His family mobilized EMS,  and he was brought to the [facility] Emergency Department for eval.  While he was in the ER, he had another seizure and was deemed unable to  protect his airway and is endotracheally intubated.  His laboratory  evaluation revealed subtherapeutic Dilantin level, hyperkalemia (equal  to 5.7) and a mixed acidosis.  Critical Care Medicine was asked to  evaluate and admit.  On April 10, 2008, the patient had a hypotension  overnight and was deemed to be in systemic inflammatory response  syndrome.  The troponin I found to be elevated on April 10, 2008, to  0.76, CK 245, CK-MB 11.3, troponin I was found to be elevated.  The  patient extubated without complications.  The patient had a central  venous catheter inserted for medication and fluid resuscitation and  condition was significantly improved on April 11, 2008.  Note, during  this time, the patient's Dilantin dose was increased and his Dilantin  level was being checked regularly.  On April 12, 2008, [facility]  Cardiology was consulted and troponin I was noted to be down trending  value on date of consultation at 0.31.  The patient was assessed to have  a non-ST elevation MI in the setting of his SIRS and seizure activity  and was scheduled for non-urgent diagnostic cardiac catheterization the  following Monday, April 15, 2008 (see results above).  Due to the patient's risk of long-term Plavix use secondary to his  recent head trauma during his seizure activity and the nature of his  coronary artery disease, Cardiothoracic Surgery was consulted for CABG  eval.  On April 15, 2008, Cardiothoracic Surgery discussed the patient's  CAD, as well as his comorbidities  with [facility] Cardiology and discussed  the risks and benefits of CABG with the patient.  A CT Surgery and the  patient decided to tentatively schedule surgery for April 22, 2008.  The  patient received tobacco cessation counseling, as well as previous CABG  Dopplers (results described above) and the patient deemed stable enough  for discharge on April 17, 2008, to await his CABG scheduled for April 22, 2008, at home.  The patient will be discharged with prescriptions to  continue his antibiotic course secondary to aspiration pneumonia, as  well as prescriptions for his new medications, a complete new medication  list, post cath instructions, as well as instructions for followup for  his surgery on Monday.  At the time of discharge, the patient was in  stable condition and had no questions or concerns that had not been  addressed.   DISCHARGE LABORATORY DATA:  Blood gases pH 7.461, PCO2 33.6, PO2 73.4,  bicarbonate 23.6, PCO2 24.6, acid base excess 0.2,  O2 saturation 95.2.  WBC 7.9, hgb 12.2, HCT 35.3, PLT count 254.  Sodium 140, potassium 3.2,  chloride 104, CO2 24, glucose 167, BUN 5, creatinine 0.98.  Total  bilirubin 0.9, alk phos 59, AST 13, ALT 14, total protein 5.6, albumin  2.5.  Calcium 7.7.  The patient's blood type, AB positive.   FOLLOWUP PLANS AND APPOINTMENTS:  Please see hospital course.   DISCHARGE MEDICATIONS:  1. Diazepam 5 mg p.o. q.12 h. p.r.n.  2. Epitol 400 mg p.o. b.i.d.  3. Simvastatin 40 mg p.o. daily.  4. Coreg 25 mg p.o. b.i.d.  5. Protonix 40 mg p.o. daily.  6. Phenytoin 200 mg p.o. b.i.d.  7. Glyburide 5 mg p.o. b.i.d.  8. Metformin 1 g p.o. b.i.d.  9. Lisinopril 20 mg p.o. daily.  10.Aspirin 325 mg p.o. daily.  11.Amlodipine 5 mg p.o. daily.  12.Avelox 400 mg p.o. daily x4 days.  13.Potassium 20 mEq p.o. daily.   Duration of discharge encounter including physician time was an hour and  15 minutes.      Jarrett Ables, [facility]      Veverly Fells.  Excell Seltzer, MD  Electronically Signed    MS/MEDQ  D:  04/17/2008  T:  04/18/2008  Job:  [id number]   cc:   Everardo Beals. Juanda Chance, MD, [facility]  Marca Ancona, MD  Salvatore Decent. Dorris Fetch, M.D.  Leslye Peer, MD

## 2010-06-30 NOTE — H&P (Signed)
[name], [name]              ACCOUNT NO.:  [id number]   MEDICAL RECORD NO.:  [id number]          PATIENT TYPE:  INP   LOCATION:  IC04                          FACILITY:  [facility]   PHYSICIAN:  Osvaldo Shipper, MD     DATE OF BIRTH:  1928/03/30   DATE OF ADMISSION:  03/20/2007  DATE OF DISCHARGE:  LH                              HISTORY & PHYSICAL   PRIMARY CARE PHYSICIAN:  [name]. Wilson, M.D.,   ADMISSION DIAGNOSES:  1. Multiple episodes of seizures in the setting of known seizure      disorder.  2. History of type 2 diabetes.  3. Possible history of hypertension.   CHIEF COMPLAINT:  Seizures.   HISTORY OF PRESENT ILLNESS:  The patient is a 77 year old Caucasian male  who lives at home with his wife here in [place] who was apparently in  his usual state of health until this morning when he had a seizure  episode.  This was followed by a total of 4 more episodes at home as  well as in the ED here.  It is unknown if the patient's medications were  changed recently.  It is also unknown if the patient has been compliant  with his medications.  According to the son, who was with him in the  room, the patient has not had a seizure-type activity over the last 3-4  years.  Apart from this fact, the son does not know more information  about this patient.  The patient currently is postictal and unable to  provide any history.   MEDICATIONS AT HOME:  1. Tegretol 200 mg 4 times a day.  2. Metoprolol 100 mg once daily.  I think this is metoprolol XL.  3. Diazepam 10 mg 3 times a day.  4. Simvastatin 40 mg once a day.  5. Lisinopril 10 mg once a day.  6. Glyburide 5 mg twice a day.  7. Phenytoin 100 mg q.a.m. and 200 mg q.p.m.  8. Meloxicam 15 mg as needed for arthritis.  9. Metformin 1000 mg.  Frequency is unknown, but I think it is twice      daily.   The medication doses are obtained from the ED sheet.  Hence, accuracy is  questionable at this time.   ALLERGIES:  No known  drug allergies.   PAST MEDICAL HISTORY:  1. Seizure disorder for many years.  2. Type 2 diabetes.  3. Hypertension.  4. Dyslipidemia.  5. According to old records, he also had history of GERD.  6. Generalized anxiety disorder.  7. He apparently had a heart attack at the age of 14.  8. He has resting tremors.   SOCIAL HISTORY:  Lives in [place] with his wife.  He chews tobacco,  but according to the son he may have quit.  He does not know.  No  alcohol use currently.  No illicit drug use.  He used to work in [employer] but is currently retired.   FAMILY HISTORY:  Positive for coronary artery disease and hypertension.   REVIEW OF SYSTEMS:  Unable to do.  PHYSICAL EXAMINATION:  VITAL SIGNS:  Temperature 100.1 rectally.  Blood  pressure when he came in was 146/77 with last reading of 100/50, heart  rate in the 80s, respiratory rate 20, saturation 96%, unknown if this  was on O2 or not.  GENERAL:  Slightly overweight white male, slightly agitated at this time  in no distress.  HEENT:  There is no pallor, no icterus.  Oral mucous membranes moist.  No oral lesions are noted, but it was a difficult examination.  LUNGS:  Clear to auscultation bilaterally.  No crackles appreciated.  No  wheezing.  CARDIAC:  S1, S2 normal, regular.  No murmurs appreciated.  I do not  appreciate an S3.  Difficult to do carotid auscultation because patient  was agitated.  ABDOMEN:  Soft, nontender, nondistended.  Bowel sounds are present.  EXTREMITIES:  Show no edema.  Peripheral pulses are palpable.  NEUROLOGIC:  The patient is awake, agitated, not oriented, clearly  postictal.  He is moving all of his extremities.   LABORATORY DATA:  White count 17,400, hemoglobin 13.1, platelet count  370, 89% neutrophils. BMET shows bicarb 14, glucose 257, BUN 12,  creatinine 1.2.  His carbamazepine is 4.1.  Phenytoin is 3.8.   He had a CT of the head which showed a possible old left thalamic   infarct with no acute process.   ASSESSMENT:  This is a 77 year old Caucasian male, with medical problems  as stated earlier, who presents with multiple seizures over the course  of the day today.  His Dilantin level is subtherapeutic which is  probably the reason for his seizures.  He also has an elevated white  count which is concerning at this time.  He has a low-grade temperature.   PLAN:  1. Seizure disorder.  He is being loaded with fosphenytoin as ordered      by the ED physician. We will continue IV Dilantin until the patient      is more awake and alert.  If he continues to have recurrent      seizures, other etiologies may have to be considered including      meningitis, etc., and he may need LP.  2. Low-grade fever with leukocytosis.  We will check a UA.  We will      check a chest x-ray.  I am going to empirically put him on      ceftriaxone.  Blood cultures will be sent off as well.  3. Acidosis, likely a result of seizures. We will check his BMET and      make sure his acidosis resolves.  4. History of diabetes.  Check CBGs before meals or food and nightly,      put him on a sliding scale.  5. Agitation. Will give him a dose of Ativan to calm him down.  We may      have to use restraints in order to prevent him from pulling his IV      out.  6. History of coronary artery disease and hypertension, all of which      are stable. We will closely monitor this      patient in the intensive care unit at least overnight.  Once he is      more stable, we will transfer him out to the step-down unit.   Further management decisions will depend on patient's response to  treatment and results of studies.      Osvaldo Shipper, MD  Electronically Signed  GK/MEDQ  D:  03/20/2007  T:  03/20/2007  Job:  [id number]   cc:   [name]. [name], M.D.  Fax: [phone]

## 2010-06-30 NOTE — Assessment & Plan Note (Signed)
OFFICE VISIT   JASMOND, RIVER  DOB:  1928-03-19                                        May 24, 2008  CHART #:  [id number]   The patient is a 77 year old gentleman, who had a non-Q-wave MI in the  setting of seizures.  He had severe two-vessel coronary artery disease  and underwent coronary artery bypass grafting x3 with his right mammary  to his right coronary artery and saphenous veins to diagonal and obtuse  marginal.  Postoperatively he had an episode just before discharge where  he had some complaints of acute dizziness and blurred vision.  He was  seen back by Neurology and a CT scan was negative and his symptoms  resolved.  Since the time of discharge, he has not had any more seizure  activity.  He does say if he stands up quickly, he will get a little  dizziness and sometimes had to sit back down.  If he is more gradual  with standing up, he does not have this problem.  He does complain of  some numbness in the fourth and fifth fingers and palm of his right  hand, and also some numbness of the medial aspect of his right leg.   PHYSICAL EXAMINATION:  GENERAL:  The patient is a 77 year old gentleman,  no acute distress.  He has lost considerable weight.  VITAL SIGNS:  His blood pressure is 130/78, pulse 75, respirations 18,  and his oxygen saturation is 99% on room air.  LUNGS:  Clear with equal breath sounds.  CARDIAC:  Regular rate and rhythm with normal S1 and S2.  No murmurs,  rubs, or gallops.  CHEST:  His sternum is stable.  Sternal incision is well healed.  EXTREMITIES:  His leg incision is well healed.  He does have a small  seroma of the thigh, which is not tender.  He does have decreased  sensation in the fourth and fifth digits of his right hand.   IMPRESSION:  The patient is a 77 year old gentleman, who had coronary  artery bypass grafting x3 for non-Q-wave myocardial infarction in the  setting of seizures.  He is doing well from a  cardiac standpoint at this  point in time.  His incisions are healing well.  He does have a bit of a  brachial plexopathy on the right, actually get better over time.  He has  a small seroma in his right thigh.  I offered to drain that for him here  in the office.  He does not wish to do that at this time.  We will call  if there is any increase in pain or size or any redness develops.  He is  to wait 2 weeks before he lifts any objects greater than 10 pounds.  From a surgical standpoint, he would be okay to drive, but he needs to  be clear by neurologist before driving, that was made very clear to him  given his recent seizure activity.  He had questions regarding cardiac  rehab.  I strongly encouraged him to at least start out with the program  and then I will learn what they can teach him and rather than not going  whatsoever.  He will continue to be followed by Dr. Charlies Constable as well  as Dr. Benedetto Goad.  I would  be happy to see him back anytime if I can  be of any further assistance with his care.   Salvatore Decent Dorris Fetch, M.D.  Electronically Signed   SCH/MEDQ  D:  05/24/2008  T:  05/25/2008  Job:  [id number]

## 2010-06-30 NOTE — Consult Note (Signed)
[name], [name]              ACCOUNT NO.:  [id number]   MEDICAL RECORD NO.:  [id number]          PATIENT TYPE:  INP   LOCATION:  A217                          FACILITY:  [facility]   PHYSICIAN:  Kofi A. Gerilyn Pilgrim, M.D. DATE OF BIRTH:  22-Sep-1928   DATE OF CONSULTATION:  DATE OF DISCHARGE:                                 CONSULTATION   REASON FOR CONSULTATION:  Seizure.   This is a 77 year old man who has known history of seizure disorder, who  lives at home with his wife.  The patient had a seizure at home.  It  appears that he had four more seizures en route to the emergency room.  It appears that his last seizure was in December of last year.  Patient  has been postictal while hospitalized.   PAST MEDICAL HISTORY:  1. Epilepsy.  2. Hypertension.  3. Type 2 diabetes.  4. Hyperlipidemia.  5. Generalized anxiety disorder.  6. Gastroesophageal reflux disease.  7. Myocardial infarction.  8. Apparent rest tremors.   ADMISSION MEDICATIONS:  1. Tegretol 200 mg 4 times a day.  2. Metoprolol 100 mg daily.  3. Diazepam 10 mg t.i.d.  4. Lisinopril 10 mg daily.  5. Glyburide 5 mg b.i.d.  6. Phenytoin generic 100 mg in the morning, 200 mg at bedtime.  7. Mobic 1500 mg p.r.n.  8. Metformin 1000 mg.   SOCIAL HISTORY:  Lives with his wife.  He chews tobacco.  No alcohol  use.  No illicit drug use.  Retired.   FAMILY HISTORY:  Positive for hypertension and coronary artery disease.   PHYSICAL EXAMINATION:  Temperature 99.4, pulse 80, respirations 21,  blood pressure 113/64.  A thin man in no acute distress.  HEENT:  Neck is supple.  Head is normocephalic and atraumatic.  ABDOMEN:  Soft.  Patient lays in bed with eyes closed.  He does open his eyes to verbal  command and is disoriented x2.  He is somewhat drowsy but easily  arousable.  Follows commands with prompting.  Cranial nerve evaluation:  Pupils are equal and round.  Extraocular movements are full.  Facial  muscle strength is  symmetric.  Tongue midline.  Motor examination shows  antigravity strength of at least 4 in all four extremities.  No pronator  drift.  Coordination shows no tremors or dysmetria.  Reflexes are  present.   Sodium 135, potassium 4.9, chloride 103, CO2 14, BUN 12, creatinine 1.2,  glucose 257.  Liver enzymes normal.  Dilantin level was 3.8.  Tegretol  4.1.  WBCs 17, hemoglobin 13, platelet count 370.  Urinalysis:  Glucose  250, large blood, specific gravity 1030.   ASSESSMENT:  Status epilepticus due to subtherapeutic levels.  Although  the Tegretol is within the therapeutic range, it is in the lower end  and in fact, most therapeutic ranges are now 8-12, instead of the 4-12  range.  The patient is on generic Dilantin, which is notoriously  difficult to absorb.   RECOMMENDATIONS:  I would change the patient's formulation to branded  Tegretol XR 400 mg in the morning and 800 mg  in the nighttime.  That  would be an increase of 400 mg, which he may be able to tolerate.  Regarding the phenytoin, I am a firm believer that using the branded  usually gets better absorption, and it is more consistent.  I would  suggest therefore continuing with the current dose of 300 mg a day but  use the branded only.   Thanks for this consultation.      Kofi A. Gerilyn Pilgrim, M.D.  Electronically Signed     KAD/MEDQ  D:  03/22/2007  T:  03/22/2007  Job:  [id number]

## 2010-06-30 NOTE — Discharge Summary (Signed)
[name], [name]              ACCOUNT NO.:  [id number]   MEDICAL RECORD NO.:  [id number]          PATIENT TYPE:  INP   LOCATION:  2015                         FACILITY:  [facility]   PHYSICIAN:  Salvatore Decent. Dorris Fetch, M.D.DATE OF BIRTH:  05/31/1928   DATE OF ADMISSION:  04/22/2008  DATE OF DISCHARGE:  04/27/2008                               DISCHARGE SUMMARY   ADDENDUM:  [name] was continuing to do well and plans were for  tentative discharge on April 26, 2008.  In the afternoon of April 24, 2008, he had an event with symptoms of acute dizziness and blurred  vision as well as ataxic gait.  He described a sudden onset of dizziness  with vertigo to the nurses.  His blood glucose was 120 at the time.  The  symptoms improved a little, however, not completely and it was felt that  Neurology consultation was necessary.  The patient was felt to require a  CT of the head for possible cerebellar stroke.  Additionally, repeat  ultrasound of the carotids.  The CT scan of the head showed no acute  intracranial abnormality.  A repeat CT scan is currently pending for  today's date.  The patient's symptoms have improved.  A 2-D  echocardiogram has been completed but the results are currently pending.  If there are no additional findings of note, the plan is for discharge  in the morning of April 27, 2008 pending morning round reevaluation.  The plan in regards to medications from a Neurology perspective are to  continue aspirin.  For full details of the hospital admission, please  see the previously dictated discharge summary which includes current  medications as well as additional followup plans.      [name], P.A.-C.      Salvatore Decent Dorris Fetch, M.D.  Electronically Signed    WEG/MEDQ  D:  04/26/2008  T:  04/26/2008  Job:  [id number]   cc:   Everardo Beals. Juanda Chance, MD, [facility]  Levert Feinstein, MD

## 2010-07-03 NOTE — Consult Note (Signed)
[name]. [name] [facility]  Patient:    Gregory, Ayers                       MRN: [id number] Proc. Date: 03/23/00 Adm. Date:  [id number] Attending:  Pamelia Ayers CC:         [name]. [name], M.D.   Consultation Report  HISTORY OF PRESENT ILLNESS:  [name] is a 77 year old, right-handed, white male born [birth year], with a history of problems with seizures without any recurrence for over 17 years.  The patient apparently has been followed through [facility] in the past by Dr. Mikael Ayers, but apparently was recently sent down to Dr. Dwaine Ayers.  This patient had apparently been on some Dilantin and Depakote.  Again, he has remained seizure free for years.  The patient was taken off the Depakote and placed on Tegretol and taken off Dilantin.  The Tegretol, however, resulted in significant reduction appetite making him feel sick, nauseated.  The patient was then taken off of Tegretol and put back on Dilantin.  The patient apparently had an MRI scan of the brain done by Dr. Quentin Ayers in November 2001.  MRI scan, according to the family, was negative.  The patient, however, suffered a generalized seizure event on the day of admission, falling, striking the frontal aspect of his head.  The patient was noted to have some jerking and twitching as well in the left arm.  Was in a cold sweat when he was found. The patient has been brought to the emergency room for an evaluation and was admitted for observation, but has remained confused throughout the evening. Seemed to be getting better a little bit last evening, but is worse today.  A repeat CT scan of the head again shows no acute abnormalities.  Neurology was asked to see this patient for further evaluation.  Admission Dilantin level was less than 2.5.  Dilantin level after load is 18.9 today.  PAST MEDICAL HISTORY:  1. History of seizure disorder with recent recurrence.  2. History of  diabetes.  3. History of hypercholesterolemia.  4. History of hypertension.  5. History of gastroesophageal reflux disease.  6. History of tremors.  7. History of anxiety.  MEDICATIONS PRIOR TO ADMISSION:  1. Dilantin 100 mg p.o. b.i.d.  2. Lipitor 10 mg daily.  3. Claritin 10 mg once a day.  4. Glucophage 1000 mg b.i.d.  5. Glyburide 10 mg daily.  6. Zestril 10 mg 1 a day.  7. Lopressor 100 mg b.i.d.  8. Aciphex 20 mg daily.  9. Valium 10 mg t.i.d. 10. Reglan 10 mg a.c. and h.s. p.r.n.  ALLERGIES:  The patient has no known allergies.  HABITS:  Does not smoke or drink.  SOCIAL HISTORY:  The patient is married.  Lives with his wife in the [place] area.  Has not been employed.  PAST SURGICAL HISTORY:  The patient also has a history of bilateral cataract extractions in the past.  Right inguinal hernia repair in the past.  FAMILY MEDICAL HISTORY:  Notable for coronary artery disease, hypertension.  REVIEW OF SYSTEMS:  Notable for no fever or chills.  The patient does note some occasional cough.  No shortness of breath.  Denies chest pain.  Denies nausea or vomiting.  Has chronic tremor problem.  Tingling of the right elbow is noted.  The patient feels weak in the stomach.  No weakness or numbness in the arms or legs  otherwise.  PHYSICAL EXAMINATION:  VITAL SIGNS:  Blood pressure 130/56, heart rate 84, respiratory rate 20, temperature afebrile.  GENERAL:  This patient is a fairly well-developed, white male who is alert and cooperative at the time of examination.  The patient is oriented to person, place. Knows the month, but not the year.  HEENT:  Head reveals ecchymosis on the right forehead and nose.  Pupils are equal, round, and reactive to light.  Disks soft and flat bilaterally.  NECK:  Supple.  The patient notes good pinprick on the face bilaterally. Speech is fairly well enunciated.  EXTREMITIES:  The patient has good strength on all four extremities.   Good symmetric motor tones noted throughout.  NEUROLOGIC:  Sensory testing is relatively intact to pinprick, soft touch, vibratory sensation throughout.  Deep tendon reflexes are symmetric and normal.  Toes are downgoing bilaterally.  The patient has fairly good finger-to-nose-to-finger and toe-to-finger, but patient has significant ataxia with cerebellar testing.  The patient was not ambulated.  LABORATORY:  Urinalysis revealed specific gravity of 1.019, pH 5.5.  Glucose 250.  Dilantin level, again, is 18.9.  Glucose 162, BUN 9, creatinine 0.7, alkaline phosphatase 65, SGOT 22, SGPT 15, protein 6.2, albumin 3.4, calcium 8.9, sodium 139, potassium 4.2, chloride 103, CO2 28.  CBC with a white count of 12.9, hemoglobin 13.5, hematocrit 38.7, MCV 90.7, platelets 258.  CT of the head shows no abnormalities.  IMPRESSION:  1. History of chronic seizure disorder with recent recurrence.  2. Confusion following seizure.  3. History of diabetes.  This patient clearly is mildly confused, ataxic at this time.  This could represent a postictal phase.  Need to consider the possibility of a post concussive syndrome and rule out the possibility of subclinical-type seizure events.  PLAN:  1. Check an EG study in the morning.  2. Follow Dilantin levels.  3. If confusion does not improve, we will consider an MRI scan of the brain.  Follow patient clinically while in house. DD:  03/23/00 TD:  03/24/00 Job: [id number]

## 2010-07-03 NOTE — Discharge Summary (Signed)
[place]. [facility]  Patient:    [name], [name]                       MRN: [id number] Adm. Date:  [id number] Disc. Date: [id number] Attending:  [name] CC:         Marlan Palau, M.D.   Discharge Summary  ADMITTING DIAGNOSES: 1. Generalized seizure disorder with poor understanding of seizure medication. 2. Chronic anxiety. 3. Affective disorder. 4. Possible memory loss. 5. Adult onset diabetes mellitus. 6. Hypertension. 7. Laceration to forehead (______  abrasion). 8. Allergies. 9. Elevated cholesterol.  DISCHARGE DIAGNOSES: Same as above with better understanding of seizure disorder.  BRIEF ADMIT NOTE:  This is a 77 year old married white male from [place], with chief complaint of seizure at home this morning.  He has not had any seizures for many years.  There had been some adjustments made in his medication recently.  He has had some side effects secondary to these; therefore, he went back to only taking Dilantin.  He missed a dose the morning of the admission.  At home this morning, his wife heard him fall, went into the room, and found him on the floor.  He did have seizure activity.  He hit his head and sustained a laceration.  He was brought to the emergency room and evaluated.  He had an observed tonic-clonic seizure while in the emergency room.  His Dilantin level was less than 2.5.  He was given a 1 gram IV load and admitted for observation.  The rest of the admission note can be obtained from the history and physical.  HOSPITAL COURSE:  The patient was admitted.  Initial CT was done to rule out intracranial hemorrhage.  Repeat CT scan also showed no acute abnormalities. An EEG was done the day prior to discharge which was essentially normal in the both awake and drowsy state.  He had no seizures during his hospitalization. Because of his diabetes, sliding scale insulin as well as CBGs were checked routinely.  Blood  pressure was also monitored and was under good control.  He remained somewhat postictal for awhile as far as his memory goes.  He did not seem himself but on the day prior to discharge, he seemed back to his normal self and on the day of discharge, he was completely back to his normal self.  LABORATORY DATA:  The day prior to discharge his Dilantin level was 19.6.  His pH on the day of admission was 7.35 with a PCO2 of 52, bicarb of 28.  His white count initially was 12,900 but the following day had decreased to 8900. Hemoglobin was 13.5 initially; the day after admission it was 12.1.  He did have a slight left shift initially felt secondary to his seizure activity. Sodium 140, potassium 4.8, chloride 108, glucose 195, BUN 19, creatinine .4. LFTs normal.  Hemoglobin A1C 6.8.  Initial Dilantin level less than 2.5.  On the 6th it was 18.9 and on the 7th it was 19.6.  His urine was essentially normal except for glucose.  His initial CT showed no acute cranial abnormalities, mild cortical atrophy, no significant change.  Chest x-ray showed no active pulmonary disease.  EKG normal sinus rhythm, normal EKG.  DISPOSITION:  DISCHARGE MEDICATIONS: 1. Valium 10 mg t.i.d. 2. Prinivil 10 mg q.d. 3. Actos 30 mg q.d. 4. Glucophage 1000 mg b.i.d. 5. Glyburide 10 mg q.d. 6. Lopressor 100 mg b.i.d. 7. Dilantin  100 mg 4 pills at night.  ACTIVITY:  As tolerated.  DIET:  His diet will remain an 1800 calorie ADA diet which is the same as prior to hospitalization.  He also knows to follow a low salt diet.  He is to call if he has any questions.  He is to call later today to make an appointment to see Dr. Benedetto Goad this coming Tuesday (03/29/00).  He will need a Dilantin level checked at that time. DD:  03/25/00 TD:  03/26/00 Job: [id number]

## 2010-07-03 NOTE — H&P (Signed)
[place]. [facility]  Patient:    [name], [name]                       MRN: [id number] Adm. Date:  [id number] Attending:  [name]                         History and Physical  CHIEF COMPLAINT:  This 77 year old married white male from [place] with the chief complaint of seizures at home this morning.  HISTORY OF PRESENT ILLNESS:  The patient has been followed for a seizure disorder.  He has not had seizures for many years.  Some adjustments had been made in his seizure medications recently.  He had side effects to most recent medicines, and therefore went back to taking Dilantin.  He states he missed his dose this morning, but not other ones.  At home this morning his wife heard him fall, and went into the room and found him on the floor.  He then had some seizure activity.  He hit his forehead and had a laceration.  He was brought to the emergency room where he was evaluated by the emergency room doctor.  He had an observed tonic-clonic seizure while in the emergency room. The Dilantin level was less than 2.5.  Other labs were unremarkable.  Dilantin load was given 1 g IV.  He is admitted for observation.  MOST RECENT CURRENT MEDICATIONS:  1. Dilantin 100 mg p.o. b.i.d.  2. Lipitor 10 mg once q.d.  3. Claritin 10 mg one q.d.  4. Glucophage 1000 mg b.i.d.  5. Glyburide 10 mg q.a.m.  6. Zestril 10 mg one q.d.  7. Lopressor 100 mg b.i.d.  8. Aciphex 20 mg one q.d.  9. Valium 10 mg t.i.d. 10. Reglan 10 mg a.c. and h.s. p.r.n. was started last month.  Tegretol was started, but the patient discontinued this due to GI side effects.  Depakote was discontinued by Dr. [name].  ALLERGIES:  No known drug allergies.  PAST MEDICAL HISTORY:  1. Diabetes mellitus.  Most recent hemoglobin A1C was 8.8%.  For     this Actos 30 mg q.d. was added recently.  2. Hypercholesterolemia.  3. Hypertension.  4. Gastroesophageal reflux disease.  5. Chronic  anxiety.  6. Resting tremor.  7. Chronic affective disorder.  8. When he was 77 years old he had a "light heart attack."  9. Had shingles three years ago. 10. A flu shot given on December 15, 1999. 11. Pneumovax given on December 08, 1995. 12. Allergies.  PAST SURGICAL HISTORY:  1. Bilateral cataract extractions and implants in 1998.  2. Ten years ago a right inguinal hernia repair.  SOCIAL HISTORY:  He has not been a smoker.  Long ago he drank alcohol, but none recently.  He does not use other drugs.  FAMILY HISTORY:  Positive for coronary artery disease and hypertension. He is married and his wife has chronic medical problems.  He is retired.  PHYSICAL EXAMINATION:  GENERAL:  A slightly overweight white male, asleep, but easily awakened. He is alert and oriented, and appropriate.  He has a mid-forehead laceration which is being managed by the emergency room doctor.  NECK:  Supple, without adenopathy.  SKIN:  No rash.  HEENT:  Otherwise unremarkable.  HEART:  Regular, without murmurs or extra sounds.  LUNGS:  Clear.  ABDOMEN:  Nontender.  GENITOURINARY:  Deferred.  RECTAL:  Deferred.  EXTREMITIES:  He has no tremor.  Moves all extremities well.  No peripheral edema.  LABORATORY DATA:  Dilantin level less than 2.5.  CBC:  White count 12,900, hemoglobin 13.5, MCV 91, platelets 258,000.  Differential:  86% neutrophils, 9% lymphocytes.  Sodium 140, potassium 4.8, BUN 19, creatinine 0.4. Urinalysis positive for glucose, negative for protein, white cells, red cells, and nitrites.  Magnesium level 1.5 which is the lower limits of normal, glucose 195.  Chest x-ray portable showed no active disease.  CT scan of the brain showed mild atrophy, but no focal lesions.  Electrocardiogram is a normal sinus rhythm.  ASSESSMENT: 1. Generalized seizure disorder with poor understanding of seizure    medications, and some side effects to others. 2. Chronic anxiety and affective  disorder. 3. Possible memory loss. 4. Diabetes mellitus. 5. Hypertension. 6. Forehead laceration, secondary to fall, secondary to seizure. 7. Allergies. 8. Hypercholesterolemia.  PLAN:  The patient is admitted for observation and loading of Dilantin.  Will check the Dilantin level in the morning.  Anticipate discharge after 24 hours of observation, unless other problems arise.  Sliding scale insulin for diabetes mellitus.  Consider a neurology consultation if there are further problems with seizures or mental status changes. DD:  03/23/00 TD:  03/23/00 Job: [id number]
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
## 2010-09-21 ENCOUNTER — Inpatient Hospital Stay (HOSPITAL_COMMUNITY)
Admission: EM | Admit: 2010-09-21 | Discharge: 2010-09-22 | DRG: 641 | Disposition: A | Discharge status: Home Health | Payer: Medicare Other | Attending: Internal Medicine | Admitting: Internal Medicine

## 2010-09-21 ENCOUNTER — Emergency Department (HOSPITAL_COMMUNITY): Payer: Medicare Other

## 2010-09-21 ENCOUNTER — Encounter (HOSPITAL_COMMUNITY): Payer: Self-pay | Admitting: Radiology

## 2010-09-21 ENCOUNTER — Inpatient Hospital Stay (HOSPITAL_COMMUNITY): Payer: Medicare Other

## 2010-09-21 DIAGNOSIS — I129 Hypertensive chronic kidney disease with stage 1 through stage 4 chronic kidney disease, or unspecified chronic kidney disease: Secondary | ICD-10-CM | POA: Diagnosis present

## 2010-09-21 DIAGNOSIS — Z79899 Other long term (current) drug therapy: Secondary | ICD-10-CM

## 2010-09-21 DIAGNOSIS — G40909 Epilepsy, unspecified, not intractable, without status epilepticus: Secondary | ICD-10-CM | POA: Diagnosis present

## 2010-09-21 DIAGNOSIS — Z951 Presence of aortocoronary bypass graft: Secondary | ICD-10-CM

## 2010-09-21 DIAGNOSIS — N182 Chronic kidney disease, stage 2 (mild): Secondary | ICD-10-CM | POA: Diagnosis present

## 2010-09-21 DIAGNOSIS — I2510 Atherosclerotic heart disease of native coronary artery without angina pectoris: Secondary | ICD-10-CM | POA: Diagnosis present

## 2010-09-21 DIAGNOSIS — T394X5A Adverse effect of antirheumatics, not elsewhere classified, initial encounter: Secondary | ICD-10-CM | POA: Diagnosis present

## 2010-09-21 DIAGNOSIS — K219 Gastro-esophageal reflux disease without esophagitis: Secondary | ICD-10-CM | POA: Diagnosis present

## 2010-09-21 DIAGNOSIS — I447 Left bundle-branch block, unspecified: Secondary | ICD-10-CM | POA: Diagnosis present

## 2010-09-21 DIAGNOSIS — Z87891 Personal history of nicotine dependence: Secondary | ICD-10-CM

## 2010-09-21 DIAGNOSIS — E871 Hypo-osmolality and hyponatremia: Secondary | ICD-10-CM | POA: Diagnosis not present

## 2010-09-21 DIAGNOSIS — E785 Hyperlipidemia, unspecified: Secondary | ICD-10-CM | POA: Diagnosis present

## 2010-09-21 DIAGNOSIS — N179 Acute kidney failure, unspecified: Secondary | ICD-10-CM | POA: Diagnosis present

## 2010-09-21 DIAGNOSIS — IMO0001 Reserved for inherently not codable concepts without codable children: Secondary | ICD-10-CM | POA: Diagnosis present

## 2010-09-21 DIAGNOSIS — E875 Hyperkalemia: Principal | ICD-10-CM | POA: Diagnosis present

## 2010-09-21 DIAGNOSIS — R4589 Other symptoms and signs involving emotional state: Secondary | ICD-10-CM | POA: Diagnosis present

## 2010-09-21 DIAGNOSIS — R29898 Other symptoms and signs involving the musculoskeletal system: Secondary | ICD-10-CM | POA: Diagnosis present

## 2010-09-21 DIAGNOSIS — D649 Anemia, unspecified: Secondary | ICD-10-CM | POA: Diagnosis present

## 2010-09-21 DIAGNOSIS — Z7982 Long term (current) use of aspirin: Secondary | ICD-10-CM

## 2010-09-21 DIAGNOSIS — I6509 Occlusion and stenosis of unspecified vertebral artery: Secondary | ICD-10-CM | POA: Diagnosis present

## 2010-09-21 LAB — COMPREHENSIVE METABOLIC PANEL
Albumin: 3.3 g/dL — ABNORMAL LOW (ref 3.5–5.2)
Chloride: 101 mEq/L (ref 96–112)
GFR calc Af Amer: 44 mL/min — ABNORMAL LOW (ref >60)
GFR calc non Af Amer: 36 mL/min — ABNORMAL LOW (ref >60)
Potassium: 5.2 mEq/L — ABNORMAL HIGH (ref 3.5–5.1)
Sodium: 134 mEq/L — ABNORMAL LOW (ref 135–145)
Total Protein: 6.4 g/dL (ref 6.0–8.3)

## 2010-09-21 LAB — CARDIAC PANEL(CRET KIN+CKTOT+MB+TROPI)
CK, MB: 3 ng/mL (ref 0.3–4.0)
Total CK: 55 U/L (ref 7–232)
Troponin I: <0.3 ng/mL (ref <0.30)

## 2010-09-21 LAB — HEMOGLOBIN A1C
Hgb A1c MFr Bld: 7.5 % — ABNORMAL HIGH (ref <5.7)
Hgb A1c MFr Bld: 7.5 % — ABNORMAL HIGH (ref <5.7)
Mean Plasma Glucose: 169 mg/dL — ABNORMAL HIGH (ref <117)
Mean Plasma Glucose: 169 mg/dL — ABNORMAL HIGH (ref <117)

## 2010-09-21 LAB — CBC
HCT: 33 % — ABNORMAL LOW (ref 39.0–52.0)
Hemoglobin: 11.1 g/dL — ABNORMAL LOW (ref 13.0–17.0)
MCHC: 33.6 g/dL (ref 30.0–36.0)
MCV: 93.2 fL (ref 78.0–100.0)
RDW: 12.8 % (ref 11.5–15.5)

## 2010-09-21 LAB — POCT I-STAT, CHEM 8
BUN: 49 mg/dL — ABNORMAL HIGH (ref 6–23)
Chloride: 110 mEq/L (ref 96–112)
Creatinine, Ser: 1.9 mg/dL — ABNORMAL HIGH (ref 0.50–1.35)
Glucose, Bld: 243 mg/dL — ABNORMAL HIGH (ref 70–99)
Hemoglobin: 11.2 g/dL — ABNORMAL LOW (ref 13.0–17.0)
Potassium: 6 mEq/L — ABNORMAL HIGH (ref 3.5–5.1)
Sodium: 135 mEq/L (ref 135–145)

## 2010-09-21 LAB — URINALYSIS, ROUTINE W REFLEX MICROSCOPIC
Glucose, UA: NEGATIVE mg/dL
Leukocytes, UA: NEGATIVE
Nitrite: NEGATIVE
Specific Gravity, Urine: 1.022 (ref 1.005–1.030)
pH: 5 (ref 5.0–8.0)

## 2010-09-21 LAB — DIFFERENTIAL
Basophils Absolute: 0 10*3/uL (ref 0.0–0.1)
Eosinophils Relative: 4 % (ref 0–5)
Lymphocytes Relative: 32 % (ref 12–46)
Lymphs Abs: 3 10*3/uL (ref 0.7–4.0)
Monocytes Absolute: 0.8 10*3/uL (ref 0.1–1.0)
Monocytes Relative: 9 % (ref 3–12)
Neutro Abs: 5.1 10*3/uL (ref 1.7–7.7)

## 2010-09-21 LAB — CK TOTAL AND CKMB (NOT AT ARMC)
CK, MB: 2.4 ng/mL (ref 0.3–4.0)
CK, MB: 2.9 ng/mL (ref 0.3–4.0)
Relative Index: INVALID (ref 0.0–2.5)
Relative Index: INVALID (ref 0.0–2.5)

## 2010-09-21 LAB — GLUCOSE, CAPILLARY: Glucose-Capillary: 221 mg/dL — ABNORMAL HIGH (ref 70–99)

## 2010-09-21 LAB — PROTIME-INR: Prothrombin Time: 15.4 seconds — ABNORMAL HIGH (ref 11.6–15.2)

## 2010-09-21 LAB — CARBAMAZEPINE LEVEL, TOTAL: Carbamazepine Lvl: 3.5 ug/mL — ABNORMAL LOW (ref 4.0–12.0)

## 2010-09-21 LAB — TROPONIN I: Troponin I: <0.3 ng/mL (ref <0.30)

## 2010-09-22 DIAGNOSIS — G459 Transient cerebral ischemic attack, unspecified: Secondary | ICD-10-CM

## 2010-09-22 LAB — URINE CULTURE
Colony Count: NO GROWTH
Culture: NO GROWTH

## 2010-09-22 LAB — CBC
HCT: 31 % — ABNORMAL LOW (ref 39.0–52.0)
Hemoglobin: 10.4 g/dL — ABNORMAL LOW (ref 13.0–17.0)
RDW: 12.6 % (ref 11.5–15.5)
WBC: 6 10*3/uL (ref 4.0–10.5)

## 2010-09-22 LAB — LIPID PANEL
Cholesterol: 151 mg/dL (ref 0–200)
HDL: 42 mg/dL (ref >39)
Total CHOL/HDL Ratio: 3.6 RATIO

## 2010-09-22 LAB — BASIC METABOLIC PANEL
Calcium: 8.5 mg/dL (ref 8.4–10.5)
Chloride: 107 mEq/L (ref 96–112)
Creatinine, Ser: 1.31 mg/dL (ref 0.50–1.35)
GFR calc Af Amer: 58 mL/min — ABNORMAL LOW (ref >60)
GFR calc non Af Amer: 53 mL/min — ABNORMAL LOW (ref >60)
Glucose, Bld: 214 mg/dL — ABNORMAL HIGH (ref 70–99)
Potassium: 5.3 mEq/L — ABNORMAL HIGH (ref 3.5–5.1)
Sodium: 138 mEq/L (ref 135–145)

## 2010-09-22 LAB — GLUCOSE, CAPILLARY

## 2010-09-22 NOTE — Discharge Summary (Signed)
[name], RISDON NO.:  [id number]  MEDICAL RECORD NO.:  [id number]  LOCATION:  3033                         FACILITY:  [facility]  PHYSICIAN:  Zannie Cove, MD     DATE OF BIRTH:  Oct 13, 1928  DATE OF ADMISSION:  09/21/2010 DATE OF DISCHARGE:                              DISCHARGE SUMMARY   PRIMARY CARE PHYSICIAN:  [name]. [name], M.D.  DISCHARGE DIAGNOSES: 1. Transient bilateral lower leg weakness, suspect either secondary to     stress and anxiety versus hyperkalemia, which was transient and     resolved. 2. 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 3. Acute dehydration. 4. Acute renal failure on chronic kidney disease stage II, improved. 5. Hyperkalemia secondary to renal failure as well as NSAIDs resolved. 6. Seizure disorders, stable. 7. History of coronary artery disease status post coronary artery     bypass grafting. 8. History of tobacco use.9. Type II diabetes. 10.Anxiety disorder. 11.Hypertension. 12.Dyslipidemia.  DIAGNOSTICS INVESTIGATIONS: 1. CT of the head on September 21, 2010, showed no acute intracranial     findings.  MRI of the brain showed no acute intracranial     abnormality, mild generalized atrophy within normal limits for age.     MRA of the brain showed high-grade stenosis of the nondominant     right vertebral artery just beyond the pica origin, moderate small     vessel disease. 2. MRA of the brain showed high-grade stenosis of the nondominant     right vertebral artery just beyond the pica origin.  DISCHARGE MEDICATIONS:  Are as follows, 1. Tylenol 60 mg q.4 h. p.r.n. 2. Hydralazine 50 mg half tab p.o. t.i.d. 3. Amlodipine 10 mg daily. 4. Aspirin 325 mg daily. 5. Celexa 20 mg daily. 6. Carbamazepine 200 mg 2 tablets p.o. b.i.d. 7. Carvedilol 25 mg p.o. b.i.d. 8. Dilantin Extended Release 30 mg capsule daily. 9. Glyburide 10 mg 2 tabs p.o. b.i.d. 10.Multivitamin 1  tablet daily. 11.Protonix 40 mg p.o. daily. 12.Phenytoin 100 mg tablet 1 capsule in the morning and 2 capsules in     the evening. 13.Simvastatin 40 mg daily. 14.Vitamin B12 1000 mcg tablet daily.  HOSPITAL COURSE:  [name] is a very pleasant 77 year old white male with multiple medical problems whose wife was recently admitted to [facility] was here with his daughter yesterday on their way to visit his wife when upon arrival to the hospital as he could not stand up, getting out of the car he felt that his legs were just not working, subsequently he wheeled into the ER for evaluation.  Without any further evaluation or specific treatment, the patient's symptoms had resolved.  However, due to age and comorbidities, he was admitted for further observation and underwent an MRI and MRA of the brain as dictated above.  1. Acute onset transient weakness of bilateral lower extremities.  I     suspect this is secondary to stress and anxiety, may have been     compounded by hyperkalemia causing weakness.  His symptoms     immediately resolved in the emergency room.  His MRI did not show  any evidence of any abnormality.  MRA showed the high-grade     vertebral stenosis as dictated above.  I discussed the patient's     MRI case and MRI findings with Dr. Pearlean Brownie at [facility].  He     did not feel that the patient symptoms could be explained by     probably a TIA resulting from his vertebral arterial lesion and     hence this was an asymptomatic vertebral arterial lesion that has     required risk factor management and close followup.  Hence, the     patient is advised to continue his full-dose aspirin as well as     statin and follow up with Dr. Delia Heady with [facility]     in a month's time.  Rest of his chronic medical problems remained stable.  He did not have any events on telemetry.  He was evaluated by physical therapy who recommended home health therapy  evaluation and treatment.  He has been ambulating independently and has no other ongoing problem.  2. Hyperkalemia this is felt to be secondary to renal failure and     NSAIDs.  His renal failure improved.  His hydration I suspect is     secondary to poor p.o. intake and diuretic use compound.  On top of     it, his Aleve has been discontinued and his Lasix being     discontinued for the short term.  His creatinine improved with IV     fluids from 1.9 to 1.3.  His fluids are discontinued now and his     Lasix will be held until further followup with his primary     physician at which time it could be restarted and his potassium has     improved from 6.0 on admission to 4.5 at the time of discharge.  CONDITION ON DISCHARGE:  Stable.  PHYSICAL EXAMINATION:  VITAL SIGNS:  Temp is 97.9, pulse 61, blood pressure 113/47, respirations 20, and satting 100% on room air.  DISCHARGE FOLLOWUP: 1. [name]. [name], M.D. in 1 week. 2. Pramod P. Pearlean Brownie, MD with [facility] in 1 month for his     vertebral artery stenosis.     Zannie Cove, MD     PJ/MEDQ  D:  09/22/2010  T:  09/22/2010  Job:  [id number]  cc:   Pramod P. Pearlean Brownie, MDFred H. [name], M.D.  Electronically Signed by Zannie Cove  on 09/22/2010 05:44:31 PM

## 2010-09-23 LAB — GLUCOSE, CAPILLARY

## 2010-11-06 LAB — PHENYTOIN LEVEL, TOTAL
Phenytoin Lvl: 14.4
Phenytoin Lvl: 3.8 — ABNORMAL LOW

## 2010-11-06 LAB — CBC
HCT: 32.6 — ABNORMAL LOW
Hemoglobin: 11.6 — ABNORMAL LOW
MCHC: 34.6
MCHC: 35.4
MCHC: 35.8
MCV: 89.7
MCV: 90.1
MCV: 91.3
Platelets: 370
RBC: 3.11 — ABNORMAL LOW
RBC: 3.62 — ABNORMAL LOW
RDW: 12.3
RDW: 12.5
RDW: 12.5
WBC: 17.4 — ABNORMAL HIGH

## 2010-11-06 LAB — BASIC METABOLIC PANEL
BUN: 12
BUN: 16
CO2: 14 — ABNORMAL LOW
CO2: 21
CO2: 23
Calcium: 8 — ABNORMAL LOW
Calcium: 8.5
Calcium: 9.2
Chloride: 102
Chloride: 103
Chloride: 108
Creatinine, Ser: 1.04
Creatinine, Ser: 1.26
GFR calc Af Amer: >60
GFR calc Af Amer: >60
GFR calc non Af Amer: >60
Glucose, Bld: 147 — ABNORMAL HIGH
Glucose, Bld: 151 — ABNORMAL HIGH
Glucose, Bld: 257 — ABNORMAL HIGH
Sodium: 133 — ABNORMAL LOW

## 2010-11-06 LAB — DIFFERENTIAL
Basophils Absolute: 0
Basophils Absolute: 0
Basophils Relative: 0
Basophils Relative: 0
Eosinophils Absolute: 0
Eosinophils Absolute: 0
Eosinophils Relative: 0
Eosinophils Relative: 0
Eosinophils Relative: 2
Lymphocytes Relative: 31
Lymphs Abs: 2.1
Lymphs Abs: 2.2
Monocytes Absolute: 0.7
Monocytes Absolute: 1.1 — ABNORMAL HIGH
Monocytes Relative: 10
Monocytes Relative: 10
Neutro Abs: 15.5 — ABNORMAL HIGH
Neutro Abs: 8.1 — ABNORMAL HIGH
Neutrophils Relative %: 89 — ABNORMAL HIGH

## 2010-11-06 LAB — URINE MICROSCOPIC-ADD ON

## 2010-11-06 LAB — HEPATIC FUNCTION PANEL
Albumin: 3.8
Bilirubin, Direct: 0.1
Indirect Bilirubin: 0.3
Total Bilirubin: 0.4

## 2010-11-06 LAB — CULTURE, BLOOD (ROUTINE X 2)
Culture: NO GROWTH
Culture: NO GROWTH
Report Status: 2072009

## 2010-11-06 LAB — URINALYSIS, ROUTINE W REFLEX MICROSCOPIC
Bilirubin Urine: NEGATIVE
Nitrite: NEGATIVE
Protein, ur: 30 — AB
Specific Gravity, Urine: >1.03 — ABNORMAL HIGH
Urobilinogen, UA: 0.2

## 2010-11-06 LAB — PROTIME-INR: INR: 1

## 2010-11-06 LAB — CARBAMAZEPINE LEVEL, TOTAL: Carbamazepine Lvl: 4.1

## 2010-11-06 LAB — APTT: aPTT: 26

## 2010-11-12 NOTE — H&P (Signed)
Ayers, Gregory              ACCOUNT NO.:  [id number]  MEDICAL RECORD NO.:  [id number]  LOCATION:  [facility]                         FACILITY:  [facility]  PHYSICIAN:  Lonia Blood, M.D.DATE OF BIRTH:  16-Sep-1928  DATE OF ADMISSION:  09/21/2010 DATE OF DISCHARGE:                             HISTORY & PHYSICAL   PRIMARY CARE PHYSICIAN:  [name]. [name], MD  CHIEF COMPLAINT:  Weakness of bilateral lower extremities.  HISTORY OF PRESENT ILLNESS:  [name] is a very pleasant 77- year-old gentleman with a complex past medical history.  Unfortunately, his wife has recently become ill and has required admission to the hospital.  The patient reports that "anytime my wife gets sick, I usually just kind of fall apart and have to be put in the hospital myself."  The patient states that he did not sleep well last night out of concern for his wife.  His daughter transported into the hospital to visit his wife today.  Upon arrival at the hospital, the patient found that he was not able to stand.  He reported that his legs "simply would not work."  He reports it was both legs and it was equal.  He had no pain in the back.  He does not have chronic low back pain.  He has not suffered any recent traumas.  He had no loss of urine or bowel function. He had no headache, fevers, chills, nausea, vomiting, abdominal pain or chest pain.  Due to his inability to walk, the patient's daughter brought him to the ER for evaluation instead.  Assistance was required to get the patient out of the car, but it does sound that as if he was at least able to stand on his legs.  While in the emergency room without any particular specific treatment, the patient's symptoms have resolved. He now states that he can walk, but he does feel somewhat lightheaded and dizzy when attempting to stand.  He denies cough, or recurrent subjective fevers.  He denies headache or acute visual change.  He denies focal  neurologic deficits.  REVIEW OF SYSTEMS:  Comprehensive review of systems unremarkable exception to the multiple positive elements noted in the history of present illness above.  PAST MEDICAL HISTORY: 1. Epilepsy. 2. Coronary artery disease, status post coronary artery bypass graft     x3 vessels in 2010. 3. Hypertension. 4. Hyperlipidemia. 5. Diabetes mellitus type 2. 6. Anxiety disorder. 7. History of aspiration pneumonia with vent dependent respiratory     failure. 8. Chronic left bundle branch block. 9. Remote history of tobacco abuse. 10.Gastroesophageal reflux disease. 11.Chronic normocytic anemia.  MEDICATIONS:  Outpatient medications -- complete list of the patient's medications is reviewed as per the med reconciliation form provided by the pharmacy at the time the patient's admission.  ALLERGIES:  No known drug allergies.  FAMILY HISTORY:  Noncontributory.  SOCIAL HISTORY:  The patient does not presently smoke tobacco.  He denies alcohol use.  He is married.  He is retired.  He lives with his wife in the [place] area.  LABORATORY DATA:  Reviewed.  Sodium is normal, but potassium is high at 6.0, chloride is normal, bicarb is borderline  at 16, BUN is significantly elevated at 49 with a creatinine elevated at 1.9 and a serum glucose elevated to 43, hemoglobin is low at 11.1 with a normal MCV, normal platelet count, normal white count.  Dilantin level is within therapeutic range at 12.9.  Tegretol is 3.5.  Urinalysis reveals 15 ketones, but is otherwise unrevealing.  Cardiac enzymes x1 are negative.  A 12-lead EKG reveals 79 beats per minute with sinus tachycardia and a left bundle branch block, but no acute changes otherwise.  CT scan of the head reveals no acute disease.  PHYSICAL EXAMINATION:  VITAL SIGNS:  Temperature 98.1, blood pressure 105/59, heart rate 66, respiratory rate 18, O2 saturations are 99% on room air. GENERAL:  Thin frail-appearing gentleman,  in no acute respiratory distress. HEENT:  Normocephalic, atraumatic.  Pupils are equal, round, and reactive to light and accommodation.  Extraocular muscles intact bilaterally.  OC/OP clear. NECK:  No JVD. LUNGS:  Clear to auscultation bilaterally without wheezes or rhonchi. CARDIOVASCULAR:  Regular rate and rhythm without murmur, gallop or rub. Normal S1 and S2. ABDOMEN:  Thin, soft, bowel sounds present.  No organomegaly, rebound or ascites. EXTREMITIES:  No significant cyanosis, clubbing, or edema in bilateral lower extremities. NEUROLOGIC:  The patient is alert and oriented x4.  Cranial II-XII are intact bilaterally.  He displays 4+/5 strength in bilateral upper and lower extremities.  He is intact sensation to touch throughout and he has a deep tendon reflexes are 1+ bilaterally.  There is no Babinski.  IMPRESSION AND PLAN: 1. Acute bilateral lower extremity weakness -- the patient's symptoms     are not terribly convincing of a transient ischemic attack.  His     bilateral distribution is quite unusual for stroke, but not     entirely impossible.  A CT scan of the head has ruled out any acute     hemorrhagic cerebrovascular accident.  I do feel, given this     patient's significant risk factor profile, that further evaluation     with an MRI, specifically to image the cerebellum, it is     reasonable.  We will also obtain carotid Dopplers as the patient     would be at high risk for carotid artery disease and this will also     allow [place] to look at the basilar artery.  I will hold on any further     stroke workup at this time; however, I am not entirely convinced     that symptoms are related to a stroke.  A primary concern is that     of significant severe dehydration with a possible anxiety     exacerbating his symptoms.  Please see my further discussion below. 2. Significant dehydration with hypovolemic hypotension -- [name]     usually has a blood pressure that is  poorly controlled with     multiple medications.  In the ER, he has a systolic blood pressure     of 105.  He has ketones in his urine and his BUN and creatinine     ratio is markedly elevated.  I suspect that Mr. Roker is simply     significantly dehydrated.  This is likely due to decreased p.o.     intake because of his wife's illness in the face of ongoing use of     all of his antihypertensives and his diuretics.  We will hydrate     the patient.  We will reassess his orthostatic symptoms  and     presyncopal symptoms after he is hydrated.  It is not likely that     any further evaluation will be required. 3. Prerenal azotemia -- the patient is suffering with acute renal     failure with creatinine of 1.9 and BUN of 49.  His baseline appears     to be more on the level of 1.25.  This appears to be due to     diuretics, plus antihypertensives with prerenal azotemia, and     possible mild acute tubular necrosis due to dehydration and ongoing     use of blood pressure medications.  We will hydrate him.  We will     follow up his renal function in the morning. 4. Mild hyperkalemia -- this appears to be related to the patient's     acute renal failure.  We will hold all potential nephrotoxins.  We     will hold his diuretics.  We will hydrate the patient.  We will     follow potassium level in the morning.  At this point, there is no     indication for other acute treatments of his moderate hyperkalemia. 5. Seizure disorder/epilepsy -- we will continue the patient's home     antiseizure medications without change. 6. Coronary artery disease -- we will cycle cardiac enzymes.  The     patient is not having symptoms at this present time.  He is     diabetic, however, therefore be at risk for silent ischemia.  Also,     he could develop demand ischemia in the setting of his hypotension     in the setting of what I suspect this significant relative     hypotension. 7. Diabetes mellitus type  2 -- this is poorly controlled at the     present time.  We will place the patient on sliding scale insulin     and follow CBG.  DISPOSITION:  If we are able to obtain the patient's carotid Dopplers and MRI in timely fashion and if he responds well to rehydration/volume resuscitation, he may very well be a good candidate for discharge tomorrow.  If however, these issues are not met, he may require conversion to a full inpatient hospital stay with a more extensive workup.     Lonia Blood, M.D.     JTM/MEDQ  D:  09/21/2010  T:  09/21/2010  Job:  [id number]  cc:   [name]. [name], M.D.  Electronically Signed by Jetty Duhamel M.D. on 11/12/2010 09:46:32 AM

## 2010-11-29 IMAGING — CT CT HEAD W/O CM
1 series · 16 of 30 positions shown, 20 images · non-contrast
Comparison: 04/27/2008

CLINICAL DATA: Code stroke/slurred speech and unsteady gait

CT HEAD WITHOUT CONTRAST
TECHNIQUE: Contiguous axial images were obtained from the base of
the skull through the vertex without contrast.

[Series 2: (id) head 4.8 h37s st · axial · 0.50mm/px · z∈[+1189,+1352]mm · 16 of 36 slices shown, 20 images]
[im 2/36  brain]
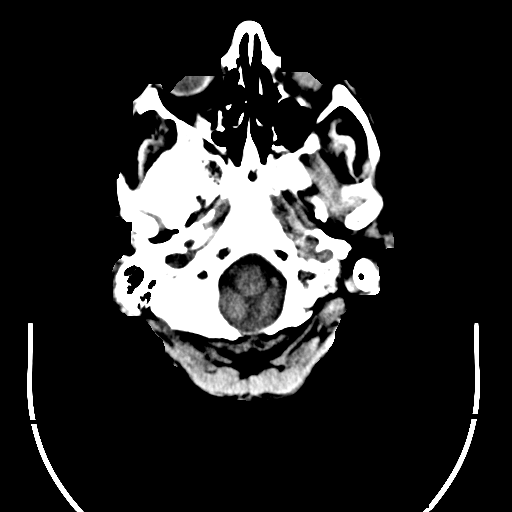
[im 2/36  bone]
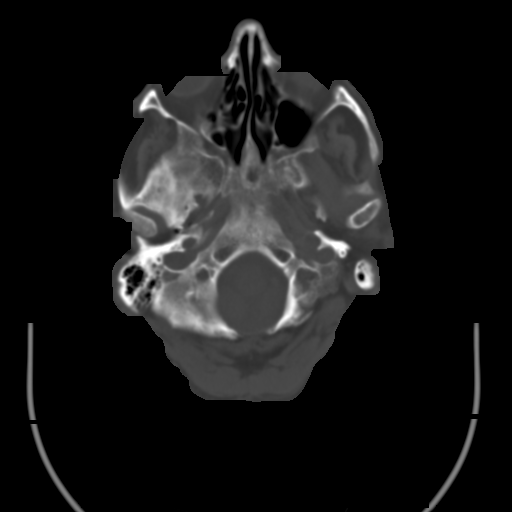
[im 4/36  brain]
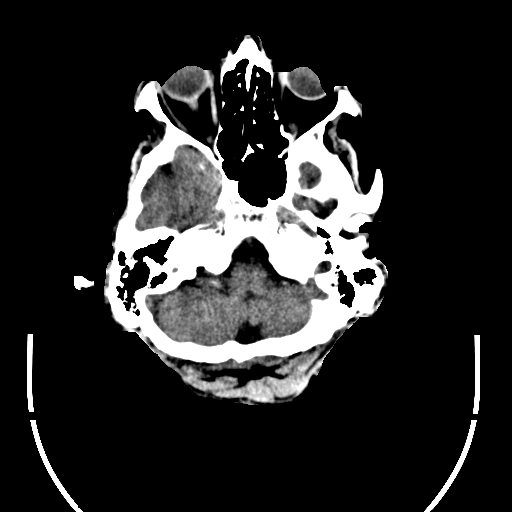
[im 7/36  brain]
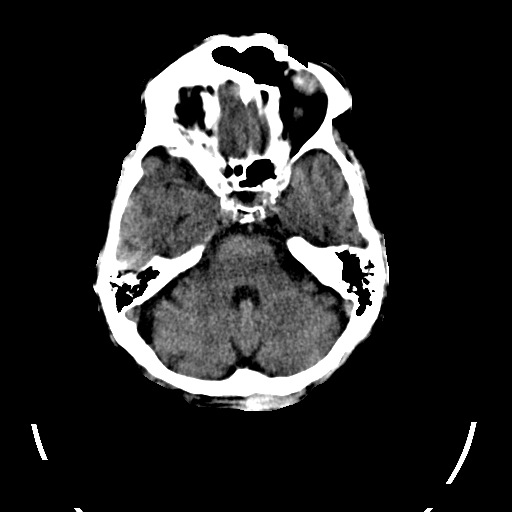
[im 9/36  brain]
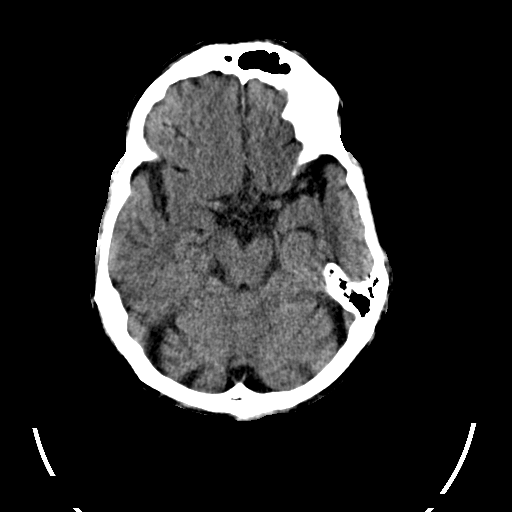
[im 10/36  brain]
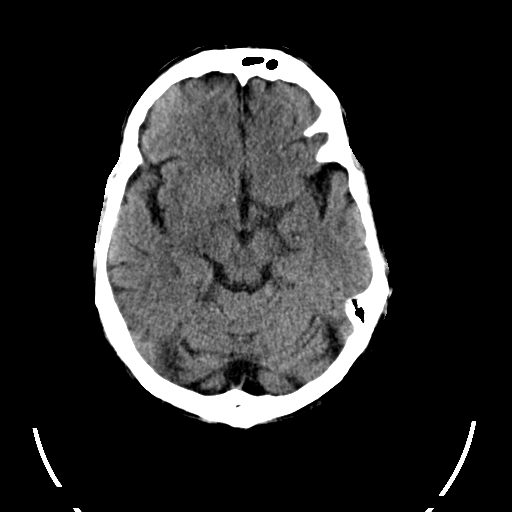
[im 10/36  bone]
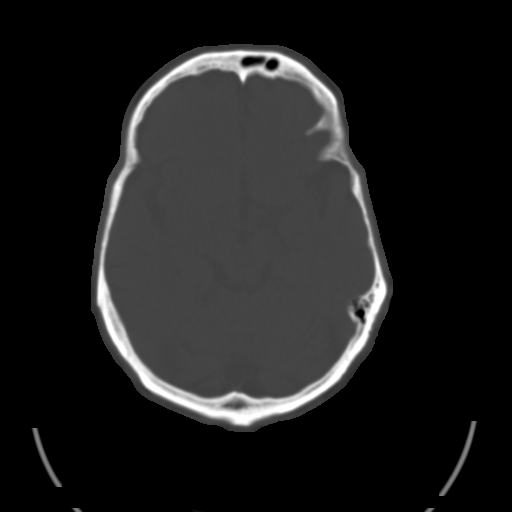
[im 13/36  brain]
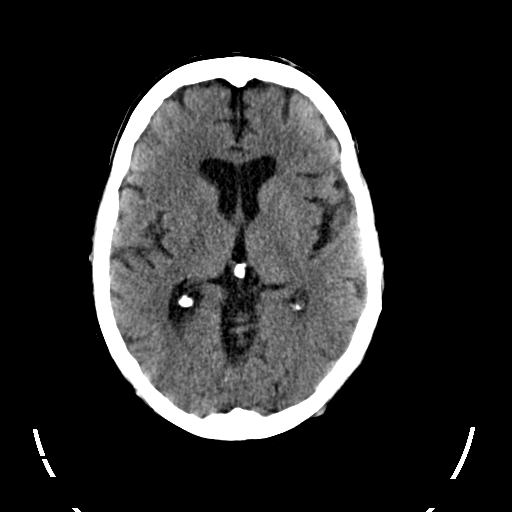
[im 15/36  brain]
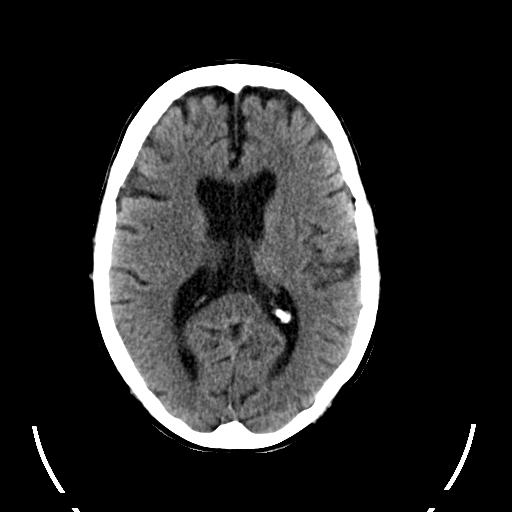
[im 17/36  brain]
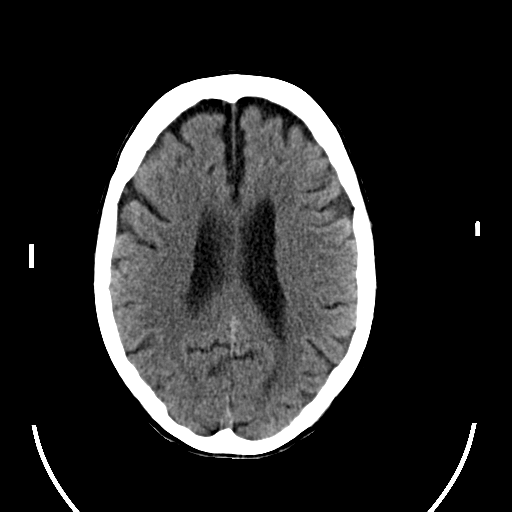
[im 19/36  brain]
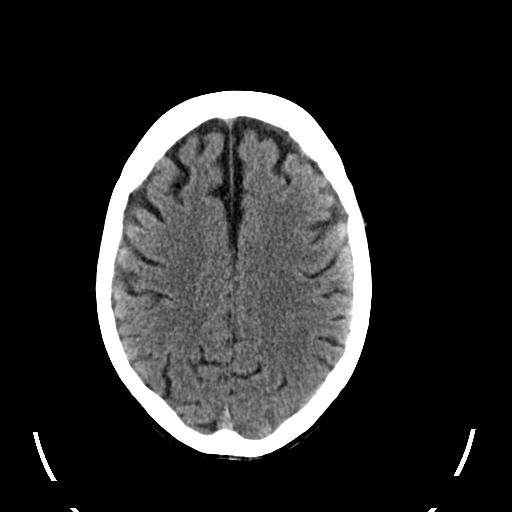
[im 19/36  bone]
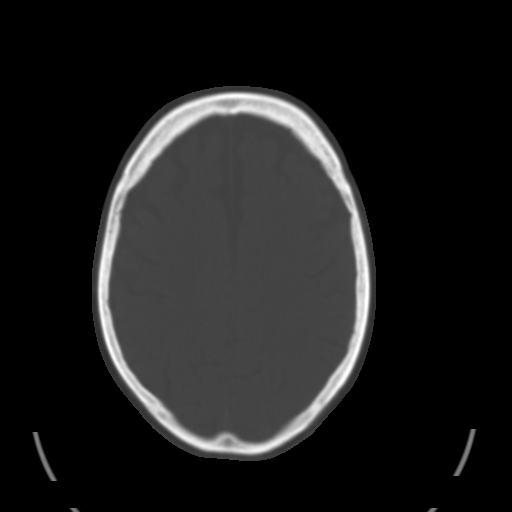
[im 21/36  brain]
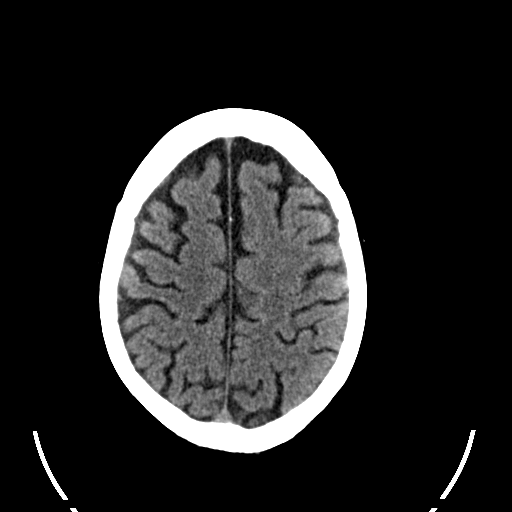
[im 23/36  brain]
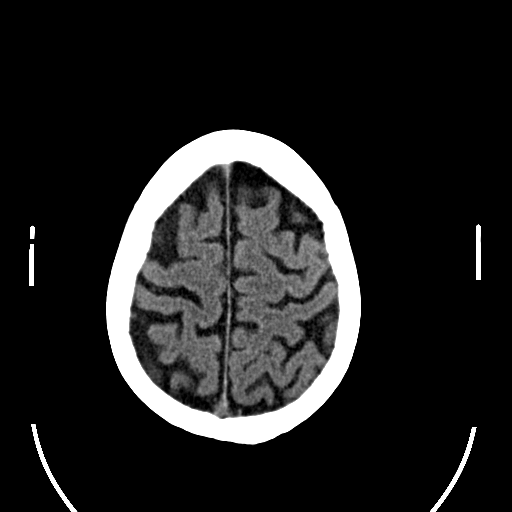
[im 26/36  brain]
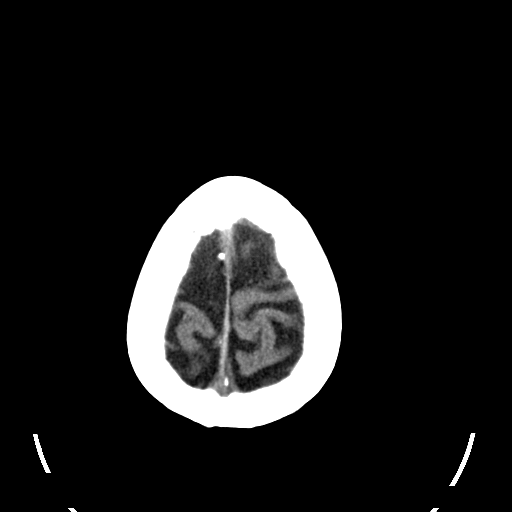
[im 27/36  brain]
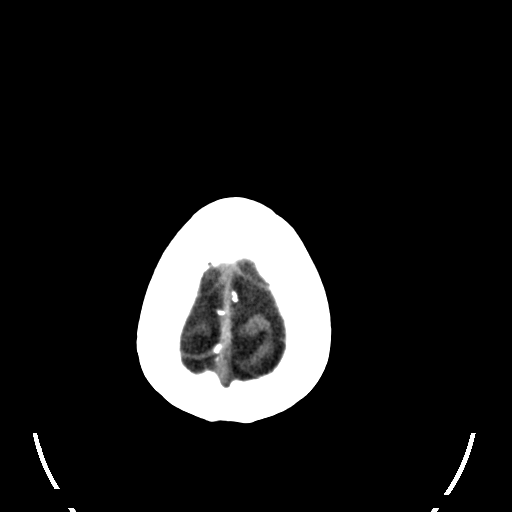
[im 27/36  bone]
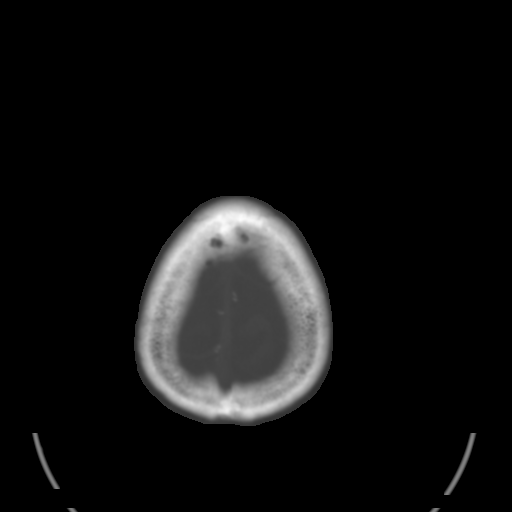
[im 29/36  brain]
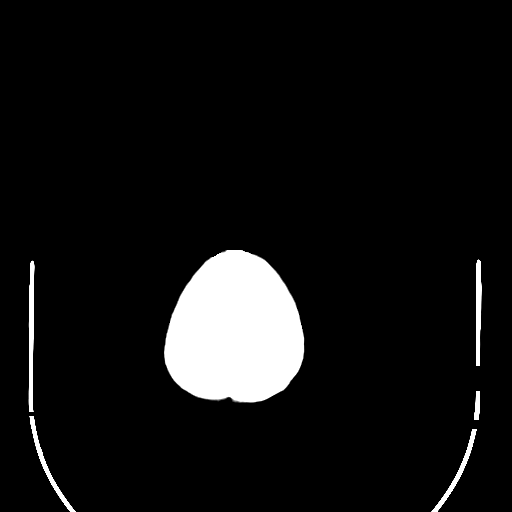
[im 32/36  brain]
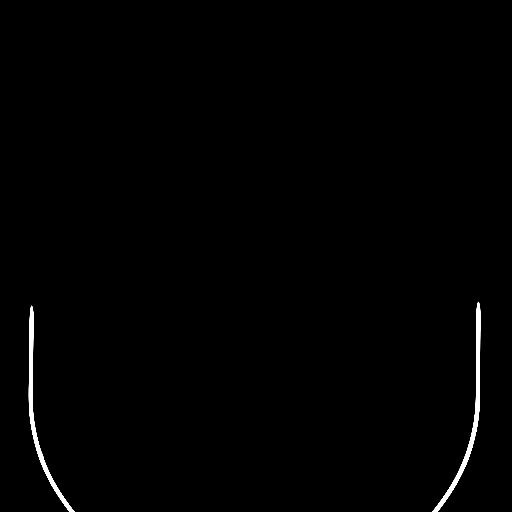
[im 34/36  brain]
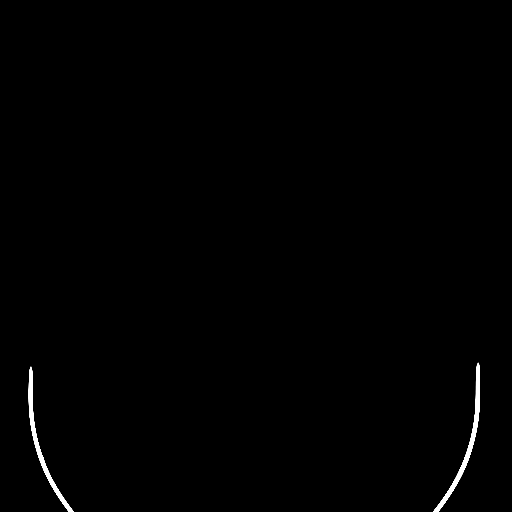

[16 of 30 positions shown; findings below may reference images not displayed]

FINDINGS: Moderate central and cortical atrophy.  Mild
microvascular white matter changes.

No definite acute bleed or infarct.  Probable tiny lacune in the
left thalamus on image number 12.

No mass or midline shift.

Calvarium intact with no lesions.  No fluid in the sinuses
visualized.

These findings were personally phoned to the ED physician
responsible for the patient's care.
IMPRESSION: 1.  Atrophy and microvascular white matter changes with tiny
lacunar infarct in the left thalamus.
2.  No definite acute findings or interval change.

## 2010-12-07 ENCOUNTER — Other Ambulatory Visit: Payer: Self-pay

## 2010-12-07 MED ORDER — HYDRALAZINE HCL 50 MG PO TABS: 50.0000 mg | ORAL_TABLET | Freq: Three times a day (TID) | ORAL | Status: DC

## 2010-12-07 NOTE — Telephone Encounter (Signed)
A user error has taken place.

## 2010-12-07 NOTE — Telephone Encounter (Signed)
.   Requested Prescriptions   Signed Prescriptions Disp Refills  . hydrALAZINE (APRESOLINE) 50 MG tablet 90 tablet 6    Sig: Take 1 tablet (50 mg total) by mouth 3 (three) times daily.    Authorizing Provider: Verne Carrow    Ordering User: Lacie Scotts   E-scribe to [employer], [employer]

## 2011-02-09 ENCOUNTER — Emergency Department (HOSPITAL_COMMUNITY): Payer: Medicare Other

## 2011-02-09 ENCOUNTER — Encounter (HOSPITAL_COMMUNITY): Payer: Self-pay | Admitting: *Deleted

## 2011-02-09 ENCOUNTER — Inpatient Hospital Stay (HOSPITAL_COMMUNITY)
Admission: EM | Admit: 2011-02-09 | Discharge: 2011-02-11 | DRG: 812 | Disposition: A | Discharge status: Home / Self Care | Payer: Medicare Other | Source: Ambulatory Visit | Attending: Internal Medicine | Admitting: Internal Medicine

## 2011-02-09 ENCOUNTER — Other Ambulatory Visit: Payer: Self-pay

## 2011-02-09 DIAGNOSIS — T383X5A Adverse effect of insulin and oral hypoglycemic [antidiabetic] drugs, initial encounter: Secondary | ICD-10-CM | POA: Diagnosis present

## 2011-02-09 DIAGNOSIS — F411 Generalized anxiety disorder: Secondary | ICD-10-CM | POA: Diagnosis present

## 2011-02-09 DIAGNOSIS — R569 Unspecified convulsions: Secondary | ICD-10-CM | POA: Diagnosis present

## 2011-02-09 DIAGNOSIS — Z951 Presence of aortocoronary bypass graft: Secondary | ICD-10-CM

## 2011-02-09 DIAGNOSIS — R269 Unspecified abnormalities of gait and mobility: Secondary | ICD-10-CM

## 2011-02-09 DIAGNOSIS — I25810 Atherosclerosis of coronary artery bypass graft(s) without angina pectoris: Secondary | ICD-10-CM | POA: Diagnosis present

## 2011-02-09 DIAGNOSIS — R531 Weakness: Secondary | ICD-10-CM | POA: Diagnosis present

## 2011-02-09 DIAGNOSIS — I1310 Hypertensive heart and chronic kidney disease without heart failure, with stage 1 through stage 4 chronic kidney disease, or unspecified chronic kidney disease: Secondary | ICD-10-CM | POA: Diagnosis present

## 2011-02-09 DIAGNOSIS — E785 Hyperlipidemia, unspecified: Secondary | ICD-10-CM

## 2011-02-09 DIAGNOSIS — E876 Hypokalemia: Secondary | ICD-10-CM

## 2011-02-09 DIAGNOSIS — D649 Anemia, unspecified: Principal | ICD-10-CM | POA: Diagnosis present

## 2011-02-09 DIAGNOSIS — I2510 Atherosclerotic heart disease of native coronary artery without angina pectoris: Secondary | ICD-10-CM | POA: Diagnosis present

## 2011-02-09 DIAGNOSIS — R0600 Dyspnea, unspecified: Secondary | ICD-10-CM

## 2011-02-09 DIAGNOSIS — R29898 Other symptoms and signs involving the musculoskeletal system: Secondary | ICD-10-CM

## 2011-02-09 DIAGNOSIS — E860 Dehydration: Secondary | ICD-10-CM | POA: Diagnosis present

## 2011-02-09 DIAGNOSIS — E119 Type 2 diabetes mellitus without complications: Secondary | ICD-10-CM | POA: Diagnosis present

## 2011-02-09 DIAGNOSIS — I119 Hypertensive heart disease without heart failure: Secondary | ICD-10-CM

## 2011-02-09 DIAGNOSIS — M6281 Muscle weakness (generalized): Secondary | ICD-10-CM | POA: Diagnosis present

## 2011-02-09 DIAGNOSIS — G40909 Epilepsy, unspecified, not intractable, without status epilepticus: Secondary | ICD-10-CM | POA: Diagnosis present

## 2011-02-09 DIAGNOSIS — Z23 Encounter for immunization: Secondary | ICD-10-CM

## 2011-02-09 DIAGNOSIS — R279 Unspecified lack of coordination: Secondary | ICD-10-CM

## 2011-02-09 DIAGNOSIS — N179 Acute kidney failure, unspecified: Secondary | ICD-10-CM | POA: Diagnosis present

## 2011-02-09 DIAGNOSIS — N189 Chronic kidney disease, unspecified: Secondary | ICD-10-CM | POA: Diagnosis present

## 2011-02-09 DIAGNOSIS — N289 Disorder of kidney and ureter, unspecified: Secondary | ICD-10-CM

## 2011-02-09 DIAGNOSIS — I10 Essential (primary) hypertension: Secondary | ICD-10-CM | POA: Diagnosis present

## 2011-02-09 HISTORY — DX: Anxiety disorder, unspecified: F41.9

## 2011-02-09 HISTORY — DX: Unspecified convulsions: R56.9

## 2011-02-09 HISTORY — DX: Angina pectoris, unspecified: I20.9

## 2011-02-09 HISTORY — DX: Acute myocardial infarction, unspecified: I21.9

## 2011-02-09 HISTORY — DX: Chronic kidney disease, unspecified: N18.9

## 2011-02-09 LAB — CBC
HCT: 33.5 % — ABNORMAL LOW (ref 39.0–52.0)
HCT: 30.5 % — ABNORMAL LOW (ref 39.0–52.0)
Hemoglobin: 11.4 g/dL — ABNORMAL LOW (ref 13.0–17.0)
Hemoglobin: 10.4 g/dL — ABNORMAL LOW (ref 13.0–17.0)
MCHC: 34 g/dL (ref 30.0–36.0)
MCV: 89.1 fL (ref 78.0–100.0)
RBC: 3.45 MIL/uL — ABNORMAL LOW (ref 4.22–5.81)

## 2011-02-09 LAB — BASIC METABOLIC PANEL
Calcium: 9.5 mg/dL (ref 8.4–10.5)
GFR calc non Af Amer: 29 mL/min — ABNORMAL LOW (ref >90)
Glucose, Bld: 184 mg/dL — ABNORMAL HIGH (ref 70–99)
Potassium: 4.6 mEq/L (ref 3.5–5.1)

## 2011-02-09 LAB — DIFFERENTIAL
Basophils Relative: 0 % (ref 0–1)
Eosinophils Relative: 3 % (ref 0–5)
Monocytes Absolute: 0.7 10*3/uL (ref 0.1–1.0)
Monocytes Relative: 8 % (ref 3–12)
Neutro Abs: 5.8 10*3/uL (ref 1.7–7.7)

## 2011-02-09 LAB — PHENYTOIN LEVEL, TOTAL: Phenytoin Lvl: 7.4 ug/mL — ABNORMAL LOW (ref 10.0–20.0)

## 2011-02-09 LAB — CARDIAC PANEL(CRET KIN+CKTOT+MB+TROPI)
CK, MB: 1.9 ng/mL (ref 0.3–4.0)
Total CK: 37 U/L (ref 7–232)

## 2011-02-09 LAB — PROTIME-INR: Prothrombin Time: 14.6 seconds (ref 11.6–15.2)

## 2011-02-09 LAB — CREATININE, SERUM
Creatinine, Ser: 1.86 mg/dL — ABNORMAL HIGH (ref 0.50–1.35)
GFR calc non Af Amer: 33 mL/min — ABNORMAL LOW (ref >90)

## 2011-02-09 LAB — APTT: aPTT: 29 seconds (ref 24–37)

## 2011-02-09 LAB — POCT I-STAT TROPONIN I

## 2011-02-09 MED ORDER — CARBAMAZEPINE 200 MG PO TABS
200.0000 mg | ORAL_TABLET | Freq: Every day | ORAL | Status: DC
  Filled 2011-02-09: qty 1

## 2011-02-09 MED ORDER — VITAMIN B-12 1000 MCG PO TABS
1000.0000 ug | ORAL_TABLET | Freq: Every day | ORAL | Status: DC
  Administered 2011-02-09 – 2011-02-11 (×3): 1000 ug via ORAL
  Filled 2011-02-09 (×3): qty 1

## 2011-02-09 MED ORDER — ASPIRIN 325 MG PO TABS
325.0000 mg | ORAL_TABLET | Freq: Every day | ORAL | Status: DC
  Administered 2011-02-09 – 2011-02-11 (×3): 325 mg via ORAL
  Filled 2011-02-09 (×3): qty 1

## 2011-02-09 MED ORDER — PHENYTOIN SODIUM EXTENDED 30 MG PO CAPS
30.0000 mg | ORAL_CAPSULE | Freq: Every evening | ORAL | Status: DC
  Filled 2011-02-09: qty 1

## 2011-02-09 MED ORDER — INSULIN ASPART 100 UNIT/ML ~~LOC~~ SOLN
0.0000 [IU] | Freq: Three times a day (TID) | SUBCUTANEOUS | Status: DC
  Administered 2011-02-10: 9 [IU] via SUBCUTANEOUS
  Administered 2011-02-10: 1 [IU] via SUBCUTANEOUS
  Administered 2011-02-10: 2 [IU] via SUBCUTANEOUS
  Administered 2011-02-11: 3 [IU] via SUBCUTANEOUS
  Administered 2011-02-11: 5 [IU] via SUBCUTANEOUS
  Filled 2011-02-09: qty 3

## 2011-02-09 MED ORDER — CARBAMAZEPINE 200 MG PO TABS: 200.0000 mg | ORAL_TABLET | Freq: Two times a day (BID) | ORAL | Status: DC

## 2011-02-09 MED ORDER — CARVEDILOL 25 MG PO TABS
25.0000 mg | ORAL_TABLET | Freq: Two times a day (BID) | ORAL | Status: DC
  Administered 2011-02-10 – 2011-02-11 (×3): 25 mg via ORAL
  Filled 2011-02-09 (×5): qty 1

## 2011-02-09 MED ORDER — HEPARIN SODIUM (PORCINE) 5000 UNIT/ML IJ SOLN
5000.0000 [IU] | Freq: Three times a day (TID) | INTRAMUSCULAR | Status: DC
  Administered 2011-02-10 – 2011-02-11 (×5): 5000 [IU] via SUBCUTANEOUS
  Filled 2011-02-09 (×8): qty 1

## 2011-02-09 MED ORDER — DEXTROSE-NACL 5-0.9 % IV SOLN
INTRAVENOUS | Status: DC
  Administered 2011-02-10: via INTRAVENOUS

## 2011-02-09 MED ORDER — PANTOPRAZOLE SODIUM 40 MG PO TBEC
40.0000 mg | DELAYED_RELEASE_TABLET | Freq: Every day | ORAL | Status: DC
  Administered 2011-02-09 – 2011-02-11 (×3): 40 mg via ORAL
  Filled 2011-02-09 (×3): qty 1

## 2011-02-09 MED ORDER — SODIUM CHLORIDE 0.9 % IV SOLN
INTRAVENOUS | Status: DC
  Administered 2011-02-09: 125 mL/h via INTRAVENOUS
  Administered 2011-02-10 – 2011-02-11 (×3): via INTRAVENOUS

## 2011-02-09 MED ORDER — PHENYTOIN SODIUM EXTENDED 100 MG PO CAPS
300.0000 mg | ORAL_CAPSULE | Freq: Every evening | ORAL | Status: DC
  Filled 2011-02-09: qty 3

## 2011-02-09 MED ORDER — DIAZEPAM 5 MG PO TABS
10.0000 mg | ORAL_TABLET | Freq: Three times a day (TID) | ORAL | Status: DC | PRN
  Administered 2011-02-09: 10 mg via ORAL
  Filled 2011-02-09: qty 2

## 2011-02-09 MED ORDER — MIRTAZAPINE 15 MG PO TBDP
15.0000 mg | ORAL_TABLET | Freq: Every day | ORAL | Status: DC
  Administered 2011-02-09 – 2011-02-10 (×2): 15 mg via ORAL
  Filled 2011-02-09 (×3): qty 1

## 2011-02-09 MED ORDER — AMLODIPINE BESYLATE 10 MG PO TABS
10.0000 mg | ORAL_TABLET | Freq: Every day | ORAL | Status: DC
  Administered 2011-02-09 – 2011-02-10 (×2): 10 mg via ORAL
  Filled 2011-02-09 (×2): qty 1

## 2011-02-09 MED ORDER — SIMVASTATIN 40 MG PO TABS
40.0000 mg | ORAL_TABLET | Freq: Every day | ORAL | Status: DC
  Administered 2011-02-09: 40 mg via ORAL
  Filled 2011-02-09 (×2): qty 1

## 2011-02-09 MED ORDER — CARBAMAZEPINE 200 MG PO TABS
200.0000 mg | ORAL_TABLET | Freq: Every day | ORAL | Status: DC
  Administered 2011-02-10 – 2011-02-11 (×2): 200 mg via ORAL
  Filled 2011-02-09 (×2): qty 1

## 2011-02-09 MED ORDER — SODIUM CHLORIDE 0.9 % IV BOLUS (SEPSIS)
500.0000 mL | Freq: Once | INTRAVENOUS | Status: AC
  Administered 2011-02-09: 500 mL via INTRAVENOUS

## 2011-02-09 MED ORDER — CARBAMAZEPINE 200 MG PO TABS
400.0000 mg | ORAL_TABLET | Freq: Every day | ORAL | Status: DC
  Administered 2011-02-09 – 2011-02-10 (×2): 400 mg via ORAL
  Filled 2011-02-09 (×3): qty 2

## 2011-02-09 MED ORDER — PHENYTOIN SODIUM EXTENDED 30 MG PO CAPS
330.0000 mg | ORAL_CAPSULE | Freq: Every day | ORAL | Status: DC
  Administered 2011-02-09 – 2011-02-10 (×2): 330 mg via ORAL
  Filled 2011-02-09 (×3): qty 1

## 2011-02-09 MED ORDER — ASPIRIN 81 MG PO CHEW: 324.0000 mg | CHEWABLE_TABLET | Freq: Once | ORAL | Status: DC

## 2011-02-09 MED ORDER — GLYBURIDE 5 MG PO TABS
10.0000 mg | ORAL_TABLET | Freq: Two times a day (BID) | ORAL | Status: DC
  Administered 2011-02-10 – 2011-02-11 (×3): 10 mg via ORAL
  Filled 2011-02-09 (×5): qty 2

## 2011-02-09 NOTE — ED Notes (Signed)
Family states that patient walks normal but has been using the cane today due to his weakness with walking

## 2011-02-09 NOTE — ED Notes (Signed)
Patient here with c/o difficulty walking since Friday and has not been able eat and just doesn't feel good.  Patient feels weak and when he gets up he feels dizzy, his back bothers him

## 2011-02-09 NOTE — ED Notes (Signed)
[id number] READY

## 2011-02-09 NOTE — H&P (Signed)
PCP:   [name], MD   Chief Complaint: Generalized weakness   HPI: [name] is an 77 y.o. male with history of coronary disease status post bypass, diabetes type 2, history of seizure, hypertension, gait disturbance, anxiety disorder, brought in by his daughter because he has decreased in his appetite, increased weakness, and generalized malaise. Close questioning revealed a new medication started last week by his nephrologist and was furosemide. Prior to this, he was on HCTZ. He also has been on metformin and started to have some loose stool. Workup in the emergency room included an elevated BUN creatinine of 63 and 2.0. This is a significant rise for him. He has problem with ataxia as well but this is not new for him. A CT scan of his head was negative for any acute CVA. He also has a normal white count, and a borderline low hemoglobin. Hospitalist was asked to admit him for further evaluation and treatment.  Rewiew of Systems:  The patient denies anorexia, fever, weight loss,, vision loss, decreased hearing, hoarseness, chest pain, syncope, dyspnea on exertion, peripheral edema, balance deficits, hemoptysis, abdominal pain, melena, hematochezia, severe indigestion/heartburn, hematuria, incontinence, genital sores, muscle weakness, suspicious skin lesions, transient blindness,  depression, unusual weight change, abnormal bleeding, enlarged lymph nodes, angioedema, and breast masses.   Past Medical History  Diagnosis Date  . CAD (coronary artery disease)     s/p cabg 3/10...s/p MI age 17  . Seizure disorder   . Diabetes mellitus type II   . HTN (hypertension)   . HLD (hyperlipidemia)   . Anxiety disorder   . Tremor   . GERD (gastroesophageal reflux disease)   . Pulmonary vein stenosis     Past Surgical History  Procedure Date  . Coronary artery bypass graft 3/10    Medications:  HOME MEDS: Prior to Admission medications   Medication Sig Start Date End Date  Taking? Authorizing Provider  amLODipine (NORVASC) 10 MG tablet Take 10 mg by mouth daily.     Yes Historical Provider, MD  aspirin 325 MG tablet Take 325 mg by mouth daily.     Yes Historical Provider, MD  carbamazepine (TEGRETOL) 200 MG tablet Take 200-400 mg by mouth 2 (two) times daily. 1 tablet every morning; 2 tablets in the evening   Yes Historical Provider, MD  carvedilol (COREG) 25 MG tablet Take 25 mg by mouth 2 (two) times daily with a meal.     Yes Historical Provider, MD  diazepam (VALIUM) 10 MG tablet Take 10 mg by mouth 3 (three) times daily as needed. For anxiety   Yes Historical Provider, MD  furosemide (LASIX) 40 MG tablet Take 40 mg by mouth daily after breakfast.     Yes Historical Provider, MD  glyBURIDE (DIABETA) 5 MG tablet Take 10 mg by mouth 2 (two) times daily with a meal.     Yes Historical Provider, MD  metFORMIN (GLUCOPHAGE) 1000 MG tablet Take 1,000 mg by mouth 2 (two) times daily with a meal.     Yes Historical Provider, MD  mirtazapine (REMERON SOL-TAB) 15 MG disintegrating tablet Take 15 mg by mouth at bedtime.     Yes Historical Provider, MD  pantoprazole (PROTONIX) 40 MG tablet Take 40 mg by mouth daily.     Yes Historical Provider, MD  phenytoin (DILANTIN) 100 MG ER capsule Take 300 mg by mouth every evening.    Yes Historical Provider, MD  phenytoin (DILANTIN) 30 MG ER capsule Take 30 mg by mouth  every evening.    Yes Historical Provider, MD  simvastatin (ZOCOR) 40 MG tablet Take 40 mg by mouth at bedtime.     Yes Historical Provider, MD  vitamin B-12 (CYANOCOBALAMIN) 1000 MCG tablet Take 1,000 mcg by mouth daily.     Yes Historical Provider, MD     Allergies:  No Known Allergies  Social History:   reports that he has never smoked. He has quit using smokeless tobacco. His smokeless tobacco use included Chew. He reports that he drinks alcohol. His drug history not on file. he has been married for 58 years and is currently living with his daughter.  Family  History: Family History  Problem Relation Age of Onset  . Coronary artery disease    . Hypertension       Physical Exam: Filed Vitals:   02/09/11 1609 02/09/11 1717 02/09/11 2012  BP: 132/61 129/66 134/61  Pulse: 79 81 68  Temp: 97.7 F (36.5 C) 97 F (36.1 C) 97 F (36.1 C)  TempSrc: Oral Oral Oral  Resp: 18 19 18   SpO2: 100% 100% 100%   Blood pressure 134/61, pulse 68, temperature 97 F (36.1 C), temperature source Oral, resp. rate 18, SpO2 100.00%.  GEN:  Pleasant  person lying in the stretcher in no acute distress; cooperative with exam PSYCH:  alert and oriented x4; does not appear anxious does not appear depressed; affect is normal HEENT: Mucous membranes pink and anicteric; PERRLA; EOM intact; no cervical lymphadenopathy nor thyromegaly or carotid bruit; no JVD; Breasts:: Not examined CHEST WALL: No tenderness CHEST: Normal respiration, clear to auscultation bilaterally HEART: Regular rate and rhythm; no murmurs rubs or gallops BACK: No kyphosis or scoliosis; no CVA tenderness ABDOMEN: Obese, soft non-tender; no masses, no organomegaly, normal abdominal bowel sounds; no pannus; no intertriginous candida. Rectal Exam: Not done EXTREMITIES: No bone or joint deformity; age-appropriate arthropathy of the hands and knees; no edema; no ulcerations. Genitalia: not examined PULSES: 2+ and symmetric SKIN: Normal hydration no rash or ulceration CNS: Cranial nerves 2-12 grossly intact no focal neurologic deficit   Labs & Imaging Results for orders placed during the hospital encounter of 02/09/11 (from the past 48 hour(s))  CBC     Status: Abnormal   Collection Time   02/09/11  5:40 PM      Component Value Range Comment   WBC 9.8  4.0 - 10.5 (K/uL)    RBC 3.76 (*) 4.22 - 5.81 (MIL/uL)    Hemoglobin 11.4 (*) 13.0 - 17.0 (g/dL)    HCT 16.1 (*) 09.6 - 52.0 (%)    MCV 89.1  78.0 - 100.0 (fL)    MCH 30.3  26.0 - 34.0 (pg)    MCHC 34.0  30.0 - 36.0 (g/dL)    RDW 04.5  40.9 -  81.1 (%)    Platelets 280  150 - 400 (K/uL)   DIFFERENTIAL     Status: Normal   Collection Time   02/09/11  5:40 PM      Component Value Range Comment   Neutrophils Relative 59  43 - 77 (%)    Neutro Abs 5.8  1.7 - 7.7 (K/uL)    Lymphocytes Relative 30  12 - 46 (%)    Lymphs Abs 3.0  0.7 - 4.0 (K/uL)    Monocytes Relative 8  3 - 12 (%)    Monocytes Absolute 0.7  0.1 - 1.0 (K/uL)    Eosinophils Relative 3  0 - 5 (%)    Eosinophils  Absolute 0.3  0.0 - 0.7 (K/uL)    Basophils Relative 0  0 - 1 (%)    Basophils Absolute 0.0  0.0 - 0.1 (K/uL)   BASIC METABOLIC PANEL     Status: Abnormal   Collection Time   02/09/11  5:40 PM      Component Value Range Comment   Sodium 135  135 - 145 (mEq/L)    Potassium 4.6  3.5 - 5.1 (mEq/L)    Chloride 95 (*) 96 - 112 (mEq/L)    CO2 21  19 - 32 (mEq/L)    Glucose, Bld 184 (*) 70 - 99 (mg/dL)    BUN 63 (*) 6 - 23 (mg/dL)    Creatinine, Ser 1.47 (*) 0.50 - 1.35 (mg/dL)    Calcium 9.5  8.4 - 10.5 (mg/dL)    GFR calc non Af Amer 29 (*) >90 (mL/min)    GFR calc Af Amer 34 (*) >90 (mL/min)   APTT     Status: Normal   Collection Time   02/09/11  5:40 PM      Component Value Range Comment   aPTT 29  24 - 37 (seconds)   PROTIME-INR     Status: Normal   Collection Time   02/09/11  5:40 PM      Component Value Range Comment   Prothrombin Time 14.6  11.6 - 15.2 (seconds)    INR 1.12  0.00 - 1.49    POCT I-STAT TROPONIN I     Status: Normal   Collection Time   02/09/11  5:56 PM      Component Value Range Comment   Troponin i, poc 0.00  0.00 - 0.08 (ng/mL)    Comment 3             Ct Head Wo Contrast  02/09/2011  *RADIOLOGY REPORT*  Clinical Data: Weakness  CT HEAD WITHOUT CONTRAST  Technique:  Contiguous axial images were obtained from the base of the skull through the vertex without contrast.  Comparison: MRI from 09/21/2010.  CT scan from 09/21/2010.  Findings: There is no evidence for acute hemorrhage, hydrocephalus, mass lesion, or abnormal  extra-axial fluid collection.  No definite CT evidence for acute infarction.  Diffuse loss of parenchymal volume is consistent with atrophy. Patchy low attenuation in the deep hemispheric and periventricular white matter is nonspecific, but likely reflects chronic microvascular ischemic demyelination.  The visualized paranasal sinuses and mastoid air cells are clear.  IMPRESSION: Stable.  No acute intracranial abnormality.  Atrophy.  Original Report Authenticated By: ERIC A. MANSELL, M.D.   Dg Chest Port 1 View  02/09/2011  *RADIOLOGY REPORT*  Clinical Data: Chest pain, shortness of breath  PORTABLE CHEST - 1 VIEW  Comparison: Portable exam 1743 hours compared to 08/27/2010  Findings: Normal heart size post CABG. Mediastinal contours and pulmonary vascularity normal. Minimal peribronchial thickening without infiltrate or effusion. No pneumothorax. Bones unremarkable.  IMPRESSION: Post CABG. Minimal chronic bronchitic changes without acute infiltrate.  Original Report Authenticated By: Lollie Marrow, M.D.      Assessment Present on Admission:  .HYPERTENSION, BENIGN .SEIZURE DISORDER .CAD, AUTOLOGOUS BYPASS GRAFT .AODM   PLAN:  I believe Mr. Swingle is dehydrated likely from the new diuretic he was placed on. This is evidenced with elevated BUN and creatinine. And since he is in acute renal failure, metformin should be discontinued as well. We'll admit him to the hospital, placed on telemetry, repeat his fluid, check his thyroid status, and rule out for myocardial  infarction. He is on Dilantin and Tegretol, and I would check these levels. Obviously, we will stop his diuretic. He is stable, full code, and will be admitted to [facility]. I will use insulin sliding scale, and we'll put him on cardiac and modified carbohydrate diet.    Other plans as per orders.    Liliahna Cudd 02/09/2011, 8:15 PM

## 2011-02-09 NOTE — ED Notes (Signed)
Report given to [place], Charity fundraiser. Pt room will be changed to [id number]

## 2011-02-09 NOTE — ED Provider Notes (Signed)
History     CSN: [id number]  Arrival date & time 02/09/11  1605   First MD Initiated Contact with Patient 02/09/11 1628      Chief Complaint  Patient presents with  . Difficulty Walking    (Consider location/radiation/quality/duration/timing/severity/associated sxs/prior treatment) HPI Comments: Patient complains of difficulty walking for the past 4 days secondary to weakness.  He reports that he is feeling weak in both of his legs.  He has not fallen, but reports that he feels unstable and has had to walk with a cane over the last 4 days, which is new for him.  He has also been experiencing tingling of both of his legs distal to the knee.  He also reports that for the last 4 days when he goes from a sitting to standing position he experiences vertigo that typically last 15-20 seconds and then resolves. He also feels that his vision has been more blurry for the past 1-2 weeks.  He also has a decreased appetite for the past 4 days.  He also feels that he has become increasingly short of breath over the last [redacted] weeks along with intermittent epigastric pain for the past 2-3 days.  The pain typically only last a couple of minutes.  He denies any chest pain.  Patient is a 77 y.o. male presenting with weakness. The history is provided by the patient.  Weakness The primary symptoms include dizziness and visual change. Primary symptoms do not include headaches, syncope, loss of consciousness, altered mental status, seizures, loss of sensation, speech change, memory loss, fever, nausea or vomiting. The symptoms are unchanged.  Dizziness also occurs with weakness. Dizziness does not occur with nausea, vomiting or diaphoresis.   Additional symptoms include weakness. Additional symptoms do not include neck stiffness. Medical issues also include seizures, diabetes and hypertension.    Past Medical History  Diagnosis Date  . CAD (coronary artery disease)     s/p cabg 3/10...s/p MI age 64  . Seizure  disorder   . Diabetes mellitus type II   . HTN (hypertension)   . HLD (hyperlipidemia)   . Anxiety disorder   . Tremor   . GERD (gastroesophageal reflux disease)   . Pulmonary vein stenosis     Past Surgical History  Procedure Date  . Coronary artery bypass graft 3/10    Family History  Problem Relation Age of Onset  . Coronary artery disease    . Hypertension      History  Substance Use Topics  . Smoking status: Never Smoker   . Smokeless tobacco: Former Neurosurgeon    Types: Chew  . Alcohol Use: 0.0 oz/week     remote alcohol history but has not drank any for many years.      Review of Systems  Constitutional: Positive for appetite change. Negative for fever, chills and diaphoresis.  HENT: Negative for neck pain and neck stiffness.   Respiratory: Positive for cough and shortness of breath. Negative for wheezing.   Cardiovascular: Negative for chest pain, palpitations and syncope.  Gastrointestinal: Positive for abdominal pain. Negative for nausea, vomiting, diarrhea and abdominal distention.  Genitourinary: Negative for dysuria.  Musculoskeletal: Positive for back pain and gait problem.  Skin: Negative for rash.  Neurological: Positive for dizziness and weakness. Negative for speech change, seizures, loss of consciousness, syncope and headaches.  Psychiatric/Behavioral: Negative for memory loss and altered mental status.    Allergies  Review of patient's allergies indicates no known allergies.  Home Medications   Current  Outpatient Rx  Name Route Sig Dispense Refill  . AMLODIPINE BESYLATE 10 MG PO TABS Oral Take 10 mg by mouth daily.      . ASPIRIN 325 MG PO TABS Oral Take 325 mg by mouth daily.      Marland Kitchen CARBAMAZEPINE 200 MG PO TABS Oral Take 200-400 mg by mouth 2 (two) times daily. 1 tablet every morning; 2 tablets in the evening    . CARVEDILOL 25 MG PO TABS Oral Take 25 mg by mouth 2 (two) times daily with a meal.      . DIAZEPAM 10 MG PO TABS Oral Take 10 mg by  mouth 3 (three) times daily as needed. For anxiety    . FUROSEMIDE 40 MG PO TABS Oral Take 40 mg by mouth daily after breakfast.      . GLYBURIDE 5 MG PO TABS Oral Take 10 mg by mouth 2 (two) times daily with a meal.      . METFORMIN HCL 1000 MG PO TABS Oral Take 1,000 mg by mouth 2 (two) times daily with a meal.      . MIRTAZAPINE 15 MG PO TBDP Oral Take 15 mg by mouth at bedtime.      Marland Kitchen PANTOPRAZOLE SODIUM 40 MG PO TBEC Oral Take 40 mg by mouth daily.      Marland Kitchen PHENYTOIN SODIUM EXTENDED 100 MG PO CAPS Oral Take 300 mg by mouth every evening.     Marland Kitchen PHENYTOIN SODIUM EXTENDED 30 MG PO CAPS Oral Take 30 mg by mouth every evening.     Marland Kitchen SIMVASTATIN 40 MG PO TABS Oral Take 40 mg by mouth at bedtime.      Marland Kitchen VITAMIN B-12 1000 MCG PO TABS Oral Take 1,000 mcg by mouth daily.        BP 129/66  Pulse 81  Temp(Src) 97 F (36.1 C) (Oral)  Resp 19  SpO2 100%  Physical Exam  Nursing note and vitals reviewed. Constitutional: He is oriented to person, place, and time. He appears well-developed and well-nourished. No distress.  HENT:  Head: Normocephalic and atraumatic.  Eyes: EOM are normal. Pupils are equal, round, and reactive to light.  Neck: Normal range of motion. Neck supple.  Cardiovascular: Normal rate, regular rhythm and normal heart sounds.   Pulmonary/Chest: Effort normal and breath sounds normal. No respiratory distress. He has no wheezes.  Abdominal: Soft. He exhibits no distension and no mass. There is no rebound and no guarding.  Neurological: He is alert and oriented to person, place, and time. He has normal strength and normal reflexes. No cranial nerve deficit or sensory deficit. Gait abnormal.       Some asymmetry to the mouth, but patient reports this is baseline.  Gait is slow and hesitant.  Patient reports that he feels like he is going to fall when I ambulated him around the room.  Daughter reports this is different than usual.  Skin: Skin is warm and dry. No rash noted. He is  not diaphoretic.  Psychiatric: He has a normal mood and affect.    ED Course  Procedures (including critical care time)  Labs Reviewed  CBC - Abnormal; Notable for the following:    RBC 3.76 (*)    Hemoglobin 11.4 (*)    HCT 33.5 (*)    All other components within normal limits  BASIC METABOLIC PANEL - Abnormal; Notable for the following:    Chloride 95 (*)    Glucose, Bld 184 (*)  BUN 63 (*)    Creatinine, Ser 2.03 (*)    GFR calc non Af Amer 29 (*)    GFR calc Af Amer 34 (*)    All other components within normal limits  DIFFERENTIAL  APTT  PROTIME-INR  POCT I-STAT TROPONIN I  URINALYSIS, ROUTINE W REFLEX MICROSCOPIC  I-STAT TROPONIN I   Ct Head Wo Contrast  02/09/2011  *RADIOLOGY REPORT*  Clinical Data: Weakness  CT HEAD WITHOUT CONTRAST  Technique:  Contiguous axial images were obtained from the base of the skull through the vertex without contrast.  Comparison: MRI from 09/21/2010.  CT scan from 09/21/2010.  Findings: There is no evidence for acute hemorrhage, hydrocephalus, mass lesion, or abnormal extra-axial fluid collection.  No definite CT evidence for acute infarction.  Diffuse loss of parenchymal volume is consistent with atrophy. Patchy low attenuation in the deep hemispheric and periventricular white matter is nonspecific, but likely reflects chronic microvascular ischemic demyelination.  The visualized paranasal sinuses and mastoid air cells are clear.  IMPRESSION: Stable.  No acute intracranial abnormality.  Atrophy.  Original Report Authenticated By: ERIC A. MANSELL, M.D.   Dg Chest Port 1 View  02/09/2011  *RADIOLOGY REPORT*  Clinical Data: Chest pain, shortness of breath  PORTABLE CHEST - 1 VIEW  Comparison: Portable exam 1743 hours compared to 08/27/2010  Findings: Normal heart size post CABG. Mediastinal contours and pulmonary vascularity normal. Minimal peribronchial thickening without infiltrate or effusion. No pneumothorax. Bones unremarkable.  IMPRESSION:  Post CABG. Minimal chronic bronchitic changes without acute infiltrate.  Original Report Authenticated By: Lollie Marrow, M.D.     1. Anemia   2. Renal insufficiency   3. Lower extremity weakness       Date: 02/09/2011  Rate: 69  Rhythm: normal sinus rhythm  QRS Axis: left  Intervals: PR prolonged  ST/T Wave abnormalities: nonspecific ST/T changes  Conduction Disutrbances:first-degree A-V block   Narrative Interpretation:   Old EKG Reviewed: unchanged  7:45 PM Discussed patient with [facility] who will admit the patient to the hospital and come see patient in the ED.     MDM  Patient admitted to the hospital,  Will continue IVF.  CT negative for stroke.  However, feel that patient should be be evaluated further due to his new difficulty with walking.        Pascal Lux [facility] 02/11/11 2112

## 2011-02-10 ENCOUNTER — Encounter (HOSPITAL_COMMUNITY): Payer: Self-pay | Admitting: *Deleted

## 2011-02-10 LAB — RETICULOCYTES: Retic Count, Absolute: 41.7 10*3/uL (ref 19.0–186.0)

## 2011-02-10 LAB — CARDIAC PANEL(CRET KIN+CKTOT+MB+TROPI)
CK, MB: 2.1 ng/mL (ref 0.3–4.0)
Relative Index: INVALID (ref 0.0–2.5)
Total CK: 36 U/L (ref 7–232)
Troponin I: <0.3 ng/mL (ref <0.30)

## 2011-02-10 LAB — BASIC METABOLIC PANEL
CO2: 24 mEq/L (ref 19–32)
Calcium: 8.2 mg/dL — ABNORMAL LOW (ref 8.4–10.5)
Chloride: 104 mEq/L (ref 96–112)
Creatinine, Ser: 1.68 mg/dL — ABNORMAL HIGH (ref 0.50–1.35)
GFR calc Af Amer: 43 mL/min — ABNORMAL LOW (ref >90)
Sodium: 137 mEq/L (ref 135–145)

## 2011-02-10 LAB — CBC
HCT: 27.1 % — ABNORMAL LOW (ref 39.0–52.0)
MCV: 88.9 fL (ref 78.0–100.0)
Platelets: 211 10*3/uL (ref 150–400)
RBC: 3.05 MIL/uL — ABNORMAL LOW (ref 4.22–5.81)
RDW: 12.6 % (ref 11.5–15.5)
WBC: 6.5 10*3/uL (ref 4.0–10.5)

## 2011-02-10 LAB — HEMOGLOBIN A1C
Hgb A1c MFr Bld: 7.1 % — ABNORMAL HIGH (ref <5.7)
Mean Plasma Glucose: 157 mg/dL — ABNORMAL HIGH (ref <117)

## 2011-02-10 LAB — URINALYSIS, ROUTINE W REFLEX MICROSCOPIC
Hgb urine dipstick: NEGATIVE
Leukocytes, UA: NEGATIVE
Nitrite: NEGATIVE
Protein, ur: NEGATIVE mg/dL
Specific Gravity, Urine: 1.01 (ref 1.005–1.030)
Urobilinogen, UA: 0.2 mg/dL (ref 0.0–1.0)

## 2011-02-10 LAB — TSH: TSH: 2.139 u[IU]/mL (ref 0.350–4.500)

## 2011-02-10 LAB — GLUCOSE, CAPILLARY: Glucose-Capillary: 353 mg/dL — ABNORMAL HIGH (ref 70–99)

## 2011-02-10 MED ORDER — ROSUVASTATIN CALCIUM 5 MG PO TABS
5.0000 mg | ORAL_TABLET | Freq: Every day | ORAL | Status: DC
  Administered 2011-02-10: 5 mg via ORAL
  Filled 2011-02-10 (×2): qty 1

## 2011-02-10 NOTE — Progress Notes (Signed)
Subjective: Feeling better, eating better. Denies shortness of breath or chest pain.  Objective: Filed Vitals:   02/10/11 0636 02/10/11 0920 02/10/11 1048 02/10/11 1350  BP: 119/74 138/71  97/58  Pulse: 66 70  67  Temp: 97.5 F (36.4 C) 97.6 F (36.4 C)  97.6 F (36.4 C)  TempSrc: Oral Oral  Oral  Resp: 17 18  18   Height:   5\' 11"  (1.803 m)   Weight:      SpO2: 98% 98%  98%   Weight change:   Intake/Output Summary (Last 24 hours) at 02/10/11 1528 Last data filed at 02/10/11 1432  Gross per 24 hour  Intake 2662.5 ml  Output   1400 ml  Net 1262.5 ml    General: Alert, awake, oriented x3, in no acute distress.  HEENT: No bruits, no goiter.  Heart: Regular rate and rhythm, without murmurs, rubs, gallops.  Lungs: CTA, bilateral air movement.  Abdomen: Soft, nontender, nondistended, positive bowel sounds.  Neuro: Grossly intact, nonfocal. Extremities; no edema.  Lab Results:  [facility] 02/10/11 0550 02/09/11 2213 02/09/11 1740  NA 137 -- 135  K 4.4 -- 4.6  CL 104 -- 95*  CO2 24 -- 21  GLUCOSE 345* -- 184*  BUN 54* -- 63*  CREATININE 1.68* 1.86* --  CALCIUM 8.2* -- 9.5  MG -- -- --  PHOS -- -- --    Basename 02/10/11 0550 02/09/11 2213 02/09/11 1740  WBC 6.5 8.0 --  NEUTROABS -- -- 5.8  HGB 9.1* 10.4* --  HCT 27.1* 30.5* --  MCV 88.9 88.4 --  PLT 211 250 --    Basename 02/10/11 0835 02/09/11 2232  CKTOTAL 35 37  CKMB 1.9 1.9  CKMBINDEX -- --  TROPONINI <0.30 <0.30    Basename 02/09/11 2213  HGBA1C 7.1*   Basename 02/09/11 2213  TSH 2.139  T4TOTAL --  T3FREE --  THYROIDAB --   Studies/Results: Ct Head Wo Contrast  02/09/2011  *RADIOLOGY REPORT*  Clinical Data: Weakness  CT HEAD WITHOUT CONTRAST  Technique:  Contiguous axial images were obtained from the base of the skull through the vertex without contrast.  Comparison: MRI from 09/21/2010.  CT scan from 09/21/2010.  Findings: There is no evidence for acute hemorrhage, hydrocephalus, mass lesion,  or abnormal extra-axial fluid collection.  No definite CT evidence for acute infarction.  Diffuse loss of parenchymal volume is consistent with atrophy. Patchy low attenuation in the deep hemispheric and periventricular white matter is nonspecific, but likely reflects chronic microvascular ischemic demyelination.  The visualized paranasal sinuses and mastoid air cells are clear.  IMPRESSION: Stable.  No acute intracranial abnormality.  Atrophy.  Original Report Authenticated By: ERIC A. MANSELL, M.D.   Dg Chest Port 1 View  02/09/2011  *RADIOLOGY REPORT*  Clinical Data: Chest pain, shortness of breath  PORTABLE CHEST - 1 VIEW  Comparison: Portable exam 1743 hours compared to 08/27/2010  Findings: Normal heart size post CABG. Mediastinal contours and pulmonary vascularity normal. Minimal peribronchial thickening without infiltrate or effusion. No pneumothorax. Bones unremarkable.  IMPRESSION: Post CABG. Minimal chronic bronchitic changes without acute infiltrate.  Original Report Authenticated By: Lollie Marrow, M.D.    Medications: I have reviewed the patient's current medications.   Patient Active Hospital Problem List:  DM (05/13/2008)  Metformin was discontinued due to renal failure . Continue with a sliding scale insulin and glyburide . HYPERTENSION, BENIGN (05/13/2008)  Systole blood pressure fluctuate, now SBP soft.  I will hold Norvasc. Continue with carvedilol.  CAD, AUTOLOGOUS BYPASS GRAFT (05/13/2008)  Continue with aspirin, carvedilol, Crestor.  SEIZURE DISORDER (05/13/2008)  Continue with carbamazepine and dilantin.   Dehydration (02/09/2011)  Cr trending down. Continue with IV fluids.   Weakness generalized (02/09/2011) Likely secondary to dehydration. Improved. PT consult.   Acute on chronic renal failure: in the setting of diuresis and poor oral intake. Continue with IV fluids. Cr at 1.4 four month ago. Cr peak at 2 on admission.  Anemia: Hb four  month ago was at 10. Today  at 9. Repeat hb in am. I will check anemia panel.    LOS: 1 day   Kemani Heidel M.D.  [facility] 02/10/2011, 3:28 PM

## 2011-02-10 NOTE — Progress Notes (Signed)
Utilization review complete 

## 2011-02-10 NOTE — Progress Notes (Signed)
PHARMACIST - PHYSICIAN COMMUNICATION CONCERNING:  Simvastatin  DESCRIPTION: Amlodipine 10mg  and Simvastatin 40mg  were reordered from patient's home medications. FDA recommends maximum simvastatin dose of 20mg /day with concurrent amlodipine. Formulary alternative is Rosuvastatin (renal adjustment required for CrCl <32ml/min).  RECOMMENDATION: Changed Simvastatin to Rosuvastatin.   Benjaman Pott, PharmD     Pager [phone] 02/10/2011   10:51 AM

## 2011-02-11 LAB — BASIC METABOLIC PANEL
BUN: 32 mg/dL — ABNORMAL HIGH (ref 6–23)
Calcium: 8.4 mg/dL (ref 8.4–10.5)
Creatinine, Ser: 1.19 mg/dL (ref 0.50–1.35)
GFR calc Af Amer: 65 mL/min — ABNORMAL LOW (ref >90)
GFR calc non Af Amer: 56 mL/min — ABNORMAL LOW (ref >90)
Glucose, Bld: 200 mg/dL — ABNORMAL HIGH (ref 70–99)

## 2011-02-11 LAB — CBC
HCT: 27.7 % — ABNORMAL LOW (ref 39.0–52.0)
MCH: 30.1 pg (ref 26.0–34.0)
MCHC: 33.6 g/dL (ref 30.0–36.0)
MCV: 89.6 fL (ref 78.0–100.0)
RDW: 12.8 % (ref 11.5–15.5)

## 2011-02-11 LAB — IRON AND TIBC
Iron: 54 ug/dL (ref 42–135)
Saturation Ratios: 19 % — ABNORMAL LOW (ref 20–55)
TIBC: 284 ug/dL (ref 215–435)
UIBC: 230 ug/dL (ref 125–400)

## 2011-02-11 LAB — GLUCOSE, CAPILLARY: Glucose-Capillary: 274 mg/dL — ABNORMAL HIGH (ref 70–99)

## 2011-02-11 LAB — FERRITIN: Ferritin: 11 ng/mL — ABNORMAL LOW (ref 22–322)

## 2011-02-11 MED ORDER — PNEUMOCOCCAL VAC POLYVALENT 25 MCG/0.5ML IJ INJ
0.5000 mL | INJECTION | Freq: Once | INTRAMUSCULAR | Status: AC
  Administered 2011-02-11: 0.5 mL via INTRAMUSCULAR
  Filled 2011-02-11: qty 0.5

## 2011-02-11 MED ORDER — PNEUMOCOCCAL VAC POLYVALENT 25 MCG/0.5ML IJ INJ: 0.5000 mL | INJECTION | INTRAMUSCULAR | Status: DC

## 2011-02-11 NOTE — Progress Notes (Signed)
Physical Therapy Evaluation Patient Details Name: [name] MRN: [id number] DOB: [birth year] Today's Date: 02/11/2011  Problem List:  Patient Active Problem List  Diagnoses  . AODM  . HYPERLIPIDEMIA-MIXED  . HYPOPOTASSEMIA  . HYPERTENSION, BENIGN  . BEN HTN HEART DISEASE WITHOUT HEART FAIL  . CORONARY ATHEROSCLEROSIS NATIVE CORONARY ARTERY  . CAD, AUTOLOGOUS BYPASS GRAFT  . SEIZURE DISORDER  . ABNORMALITY OF GAIT  . ATAXIA  . Dyspnea  . Dehydration  . Weakness generalized    Past Medical History:  Past Medical History  Diagnosis Date  . CAD (coronary artery disease)     s/p cabg 3/10...s/p MI age 89  . Seizure disorder   . Diabetes mellitus type II   . HTN (hypertension)   . HLD (hyperlipidemia)   . Anxiety disorder   . Tremor   . GERD (gastroesophageal reflux disease)   . Pulmonary vein stenosis   . Seizures   . Angina   . Chronic kidney disease   . Anxiety   . Myocardial infarction    Past Surgical History:  Past Surgical History  Procedure Date  . Coronary artery bypass graft 3/10  . No past surgeries     PT Assessment/Plan/Recommendation PT Assessment Clinical Impression Statement: Pt independent with all mobility acute PT signing off PT Recommendation/Assessment: Patent does not need any further PT services No Skilled PT: Patient is independent with all acitivity/mobility PT Recommendation Follow Up Recommendations: None Equipment Recommended: None recommended by PT PT Goals     PT Evaluation Precautions/Restrictions  Restrictions Weight Bearing Restrictions: No Prior Functioning  Home Living Lives With: Spouse Receives Help From: Family Type of Home: Mobile home Home Layout: One level Home Access: Ramped entrance Bathroom Shower/Tub: Walk-in shower;Door [employer] Toilet: Standard Bathroom Accessibility: Yes How Accessible: Accessible via walker;Accessible via wheelchair Home Adaptive Equipment: Bedside commode/3-in-1;Grab bars  around toilet;Grab bars in shower;Walker - rolling;Wheelchair - manual Prior Function Level of Independence: Independent with basic ADLs;Independent with homemaking with ambulation;Independent with gait;Independent with transfers Able to Take Stairs?: Yes Driving: No Vocation: Retired Leisure: Hobbies-no Cognition Cognition Arousal/Alertness: Awake/alert Overall Cognitive Status: Appears within functional limits for tasks assessed Orientation Level: Oriented X4 Sensation/Coordination Sensation Light Touch: Appears Intact Stereognosis: Not tested Hot/Cold: Not tested Proprioception: Not tested Coordination Gross Motor Movements are Fluid and Coordinated: Yes Fine Motor Movements are Fluid and Coordinated: Yes Extremity Assessment RUE Assessment RUE Assessment: Within Functional Limits LUE Assessment LUE Assessment: Within Functional Limits RLE Assessment RLE Assessment: Within Functional Limits LLE Assessment LLE Assessment: Within Functional Limits Mobility (including Balance) Bed Mobility Bed Mobility: Yes Supine to Sit: 7: Independent Sit to Supine - Left: 7: Independent Transfers Transfers: Yes Sit to Stand: 7: Independent Stand to Sit: 7: Independent Ambulation/Gait Ambulation/Gait: Yes Ambulation/Gait Assistance: 7: Independent Ambulation Distance (Feet): 42 Feet Assistive device: None Gait Pattern: Within Functional Limits Stairs: No Wheelchair Mobility Wheelchair Mobility: No  Posture/Postural Control Posture/Postural Control: No significant limitations Balance Balance Assessed: No Exercise    End of Session PT - End of Session Equipment Utilized During Treatment: Gait belt Activity Tolerance: Patient tolerated treatment well Patient left: in bed;with call bell in reach Nurse Communication: Mobility status for transfers;Mobility status for ambulation General Behavior During Session: [name] for tasks performed Cognition: [name] for tasks  performed  [name] 02/11/2011, 4:46 PM [name] L. Percell Lamboy DPT [phone]

## 2011-02-11 NOTE — Progress Notes (Signed)
Inpatient Diabetes Program Recommendations  AACE/ADA: New Consensus Statement on Inpatient Glycemic Control (2009)  Target Ranges:  Prepandial:   less than 140 mg/dL      Peak postprandial:   less than 180 mg/dL (1-2 hours)      Critically ill patients:  140 - 180 mg/dL   CBGs 16/10: 960/ 454/ 184/ 280 mg/dl CBGs today: 098/ 119 mg/dl  Inpatient Diabetes Program Recommendations Insulin - Basal: Please start Lantus 10 units QHS- Pt has had elevated fasting sugars for 2 days now.  Note:

## 2011-02-11 NOTE — Progress Notes (Signed)
PT/OT DISCHARGE NOTE:  PT evaluation completed.  Pt is performing at/near baseline level of function and presents with no further acute PT/OT needs.   Acute PT/OT signing off.  Discussed plan with pt/family and they agree.    Jashay Roddy L. Shaterria Sager DPT [phone] 02/11/2011

## 2011-02-12 LAB — CARBAMAZEPINE, FREE AND TOTAL
Carbamazepine Metabolite -: 4.2 ug/mL — ABNORMAL HIGH (ref 0.2–2.0)
Carbamazepine Metabolite: 2.6 ug/mL — ABNORMAL HIGH (ref 0.1–1.0)
Carbamazepine, Free: 0.8 ug/mL — ABNORMAL LOW (ref 1.0–3.0)
Carbamazepine, Total: 3.8 ug/mL — ABNORMAL LOW (ref 4.0–12.0)

## 2011-02-12 NOTE — ED Provider Notes (Signed)
Medical screening examination/treatment/procedure(s) were conducted as a shared visit with non-physician practitioner(s) and myself.  I personally evaluated the patient during the encounter  Difficulty walking secondary to generalized weakness for the past 4 days.  No falls and has required assistance to ambulate.  Describes orthostatic near syncope.  Recent change in medications and has had decreased in po intake.  Neuro exam with normal strength ans sensation. Lungs CTAB RRR  Labs, ivf, ct, cxr, admit  Dayton Bailiff, MD 02/12/11 [phone]

## 2011-02-13 DIAGNOSIS — D649 Anemia, unspecified: Secondary | ICD-10-CM | POA: Diagnosis present

## 2011-02-13 NOTE — Discharge Summary (Signed)
Patient ID: [name] MRN: [id number] DOB/AGE: 1928/06/25 77 y.o. Primary Care Physician:WILSON,FRED [name], MD, MD Admit date: 02/09/2011 Discharge date: 02/13/2011    Discharge Diagnoses:  Dehydration due to diuretic resolved Acute renal failure due to dehydration resolved Coronary artery disease asymptomatic Seizure disorder Hypertension   Medication List  As of 02/13/2011  4:15 PM   CONTINUE taking these medications         amLODipine 10 MG tablet   Commonly known as: NORVASC      aspirin 325 MG tablet      carbamazepine 200 MG tablet   Commonly known as: TEGRETOL      carvedilol 25 MG tablet   Commonly known as: COREG      diazepam 10 MG tablet   Commonly known as: VALIUM      glyBURIDE 5 MG tablet   Commonly known as: DIABETA      mirtazapine 15 MG disintegrating tablet   Commonly known as: REMERON SOL-TAB      pantoprazole 40 MG tablet   Commonly known as: PROTONIX      * phenytoin 100 MG ER capsule   Commonly known as: DILANTIN      * phenytoin 30 MG ER capsule   Commonly known as: DILANTIN      simvastatin 40 MG tablet   Commonly known as: ZOCOR      vitamin B-12 1000 MCG tablet   Commonly known as: CYANOCOBALAMIN     * Notice: This list has 2 medication(s) that are the same as other medications prescribed for you. Read the directions carefully, and ask your doctor or other care provider to review them with you.       STOP taking these medications         furosemide 40 MG tablet      metFORMIN 1000 MG tablet            Discharged Condition: Good    Consults: None  Significant Diagnostic Studies: Ct Head Wo Contrast  02/09/2011  *RADIOLOGY REPORT*  Clinical Data: Weakness  CT HEAD WITHOUT CONTRAST  Technique:  Contiguous axial images were obtained from the base of the skull through the vertex without contrast.  Comparison: MRI from 09/21/2010.  CT scan from 09/21/2010.  Findings: There is no evidence for acute hemorrhage,  hydrocephalus, mass lesion, or abnormal extra-axial fluid collection.  No definite CT evidence for acute infarction.  Diffuse loss of parenchymal volume is consistent with atrophy. Patchy low attenuation in the deep hemispheric and periventricular white matter is nonspecific, but likely reflects chronic microvascular ischemic demyelination.  The visualized paranasal sinuses and mastoid air cells are clear.  IMPRESSION: Stable.  No acute intracranial abnormality.  Atrophy.  Original Report Authenticated By: ERIC A. MANSELL, M.D.   Dg Chest Port 1 View  02/09/2011  *RADIOLOGY REPORT*  Clinical Data: Chest pain, shortness of breath  PORTABLE CHEST - 1 VIEW  Comparison: Portable exam 1743 hours compared to 08/27/2010  Findings: Normal heart size post CABG. Mediastinal contours and pulmonary vascularity normal. Minimal peribronchial thickening without infiltrate or effusion. No pneumothorax. Bones unremarkable.  IMPRESSION: Post CABG. Minimal chronic bronchitic changes without acute infiltrate.  Original Report Authenticated By: Lollie Marrow, M.D.    Lab Results:  CBC    Component Value Date/Time   WBC 7.5 02/11/2011 0640   RBC 3.09* 02/11/2011 0640   HGB 9.3* 02/11/2011 0640   HCT 27.7* 02/11/2011 0640   PLT 203 02/11/2011 0640   MCV 89.6  02/11/2011 0640   MCH 30.1 02/11/2011 0640   MCHC 33.6 02/11/2011 0640   RDW 12.8 02/11/2011 0640   LYMPHSABS 3.0 02/09/2011 1740   MONOABS 0.7 02/09/2011 1740   EOSABS 0.3 02/09/2011 1740   BASOSABS 0.0 02/09/2011 1740     Hospital Course:  77 year old gentleman admitted with generalized weakness, found to have acute renal insufficiency and dehydration after initiation of a diuretic. He was admitted to hospital and started on  intravenous fluids. He improved without any complications. He was able tolerate a regular diet and ambulate around the room without new symptoms.  Incidentally during this admission the patient was noted to have anemia He did not  have any melena or hematemesis I have told the patient to follow up with his private care physician and arrange for outpatient colonoscopy and if needed hematology consultation. Discharge Exam: Blood pressure 118/66, pulse 68, temperature 98 F (36.7 C), temperature source Oral, resp. rate 18, height 5\' 11"  (1.803 m), weight 73.347 kg (161 lb 11.2 oz), SpO2 98.00%. Alert oriented x3 Chest clear to auscultation Heart regular rate and rhythm without murmur   Disposition: Home  Discharge Orders    Future Orders Please Complete By Expires   Diet general      Increase activity slowly         Follow-up Information    Follow up with [name], MD. (colonoscopy)          Signed: Lonia Blood 02/13/2011, 4:15 PM

## 2011-02-26 ENCOUNTER — Emergency Department (HOSPITAL_COMMUNITY): Payer: Medicare Other

## 2011-02-26 ENCOUNTER — Encounter (HOSPITAL_COMMUNITY): Payer: Self-pay | Admitting: *Deleted

## 2011-02-26 ENCOUNTER — Inpatient Hospital Stay (HOSPITAL_COMMUNITY)
Admission: EM | Admit: 2011-02-26 | Discharge: 2011-02-27 | DRG: 639 | Disposition: A | Discharge status: Home / Self Care | Payer: Medicare Other | Source: Ambulatory Visit | Attending: Internal Medicine | Admitting: Internal Medicine

## 2011-02-26 DIAGNOSIS — G40909 Epilepsy, unspecified, not intractable, without status epilepticus: Secondary | ICD-10-CM | POA: Diagnosis present

## 2011-02-26 DIAGNOSIS — E876 Hypokalemia: Secondary | ICD-10-CM

## 2011-02-26 DIAGNOSIS — I25810 Atherosclerosis of coronary artery bypass graft(s) without angina pectoris: Secondary | ICD-10-CM

## 2011-02-26 DIAGNOSIS — R269 Unspecified abnormalities of gait and mobility: Secondary | ICD-10-CM

## 2011-02-26 DIAGNOSIS — E119 Type 2 diabetes mellitus without complications: Secondary | ICD-10-CM

## 2011-02-26 DIAGNOSIS — I119 Hypertensive heart disease without heart failure: Secondary | ICD-10-CM

## 2011-02-26 DIAGNOSIS — R109 Unspecified abdominal pain: Secondary | ICD-10-CM

## 2011-02-26 DIAGNOSIS — Z794 Long term (current) use of insulin: Secondary | ICD-10-CM

## 2011-02-26 DIAGNOSIS — E1100 Type 2 diabetes mellitus with hyperosmolarity without nonketotic hyperglycemic-hyperosmolar coma (NKHHC): Principal | ICD-10-CM | POA: Diagnosis present

## 2011-02-26 DIAGNOSIS — K5900 Constipation, unspecified: Secondary | ICD-10-CM | POA: Diagnosis present

## 2011-02-26 DIAGNOSIS — Z8249 Family history of ischemic heart disease and other diseases of the circulatory system: Secondary | ICD-10-CM

## 2011-02-26 DIAGNOSIS — I2510 Atherosclerotic heart disease of native coronary artery without angina pectoris: Secondary | ICD-10-CM | POA: Diagnosis present

## 2011-02-26 DIAGNOSIS — D649 Anemia, unspecified: Secondary | ICD-10-CM

## 2011-02-26 DIAGNOSIS — R279 Unspecified lack of coordination: Secondary | ICD-10-CM

## 2011-02-26 DIAGNOSIS — E785 Hyperlipidemia, unspecified: Secondary | ICD-10-CM | POA: Diagnosis present

## 2011-02-26 DIAGNOSIS — Z951 Presence of aortocoronary bypass graft: Secondary | ICD-10-CM

## 2011-02-26 DIAGNOSIS — I10 Essential (primary) hypertension: Secondary | ICD-10-CM | POA: Diagnosis present

## 2011-02-26 DIAGNOSIS — R739 Hyperglycemia, unspecified: Secondary | ICD-10-CM

## 2011-02-26 DIAGNOSIS — R569 Unspecified convulsions: Secondary | ICD-10-CM

## 2011-02-26 DIAGNOSIS — I252 Old myocardial infarction: Secondary | ICD-10-CM

## 2011-02-26 LAB — COMPREHENSIVE METABOLIC PANEL
ALT: 13 U/L (ref 0–53)
Albumin: 3.4 g/dL — ABNORMAL LOW (ref 3.5–5.2)
Alkaline Phosphatase: 119 U/L — ABNORMAL HIGH (ref 39–117)
BUN: 25 mg/dL — ABNORMAL HIGH (ref 6–23)
Chloride: 99 mEq/L (ref 96–112)
Potassium: 5.6 mEq/L — ABNORMAL HIGH (ref 3.5–5.1)
Sodium: 135 mEq/L (ref 135–145)
Total Bilirubin: 0.2 mg/dL — ABNORMAL LOW (ref 0.3–1.2)

## 2011-02-26 LAB — CBC
Hemoglobin: 9.7 g/dL — ABNORMAL LOW (ref 13.0–17.0)
MCHC: 33.6 g/dL (ref 30.0–36.0)
Platelets: 257 10*3/uL (ref 150–400)
RBC: 3.19 MIL/uL — ABNORMAL LOW (ref 4.22–5.81)
RBC: 3.12 MIL/uL — ABNORMAL LOW (ref 4.22–5.81)
RDW: 12.8 % (ref 11.5–15.5)
WBC: 8.5 10*3/uL (ref 4.0–10.5)

## 2011-02-26 LAB — DIFFERENTIAL
Basophils Relative: 0 % (ref 0–1)
Lymphs Abs: 2.2 10*3/uL (ref 0.7–4.0)
Monocytes Relative: 5 % (ref 3–12)
Neutro Abs: 6.8 10*3/uL (ref 1.7–7.7)
Neutrophils Relative %: 71 % (ref 43–77)

## 2011-02-26 LAB — URINALYSIS, ROUTINE W REFLEX MICROSCOPIC
Glucose, UA: >1000 mg/dL — AB
Hgb urine dipstick: NEGATIVE
Ketones, ur: 15 mg/dL — AB
Protein, ur: NEGATIVE mg/dL
Urobilinogen, UA: 0.2 mg/dL (ref 0.0–1.0)

## 2011-02-26 LAB — LIPASE, BLOOD: Lipase: 22 U/L (ref 11–59)

## 2011-02-26 LAB — GLUCOSE, CAPILLARY
Glucose-Capillary: 272 mg/dL — ABNORMAL HIGH (ref 70–99)
Glucose-Capillary: 215 mg/dL — ABNORMAL HIGH (ref 70–99)

## 2011-02-26 LAB — CREATININE, SERUM
GFR calc Af Amer: 63 mL/min — ABNORMAL LOW (ref >90)
GFR calc non Af Amer: 54 mL/min — ABNORMAL LOW (ref >90)

## 2011-02-26 MED ORDER — CARBAMAZEPINE 200 MG PO TABS
400.0000 mg | ORAL_TABLET | Freq: Every day | ORAL | Status: DC
  Administered 2011-02-26: 400 mg via ORAL
  Filled 2011-02-26 (×2): qty 2

## 2011-02-26 MED ORDER — MIRTAZAPINE 15 MG PO TBDP
15.0000 mg | ORAL_TABLET | Freq: Every day | ORAL | Status: DC
  Administered 2011-02-26: 15 mg via ORAL
  Filled 2011-02-26 (×2): qty 1

## 2011-02-26 MED ORDER — DEXTROSE 50 % IV SOLN: 25.0000 mL | INTRAVENOUS | Status: DC | PRN

## 2011-02-26 MED ORDER — SODIUM CHLORIDE 0.9 % IV SOLN
Freq: Once | INTRAVENOUS | Status: AC
  Administered 2011-02-26: 18:00:00 via INTRAVENOUS

## 2011-02-26 MED ORDER — SODIUM CHLORIDE 0.9 % IV SOLN
INTRAVENOUS | Status: DC
  Administered 2011-02-26: 19:00:00 via INTRAVENOUS

## 2011-02-26 MED ORDER — HYDRALAZINE HCL 25 MG PO TABS
25.0000 mg | ORAL_TABLET | Freq: Three times a day (TID) | ORAL | Status: DC
  Administered 2011-02-26 – 2011-02-27 (×2): 25 mg via ORAL
  Filled 2011-02-26 (×4): qty 1

## 2011-02-26 MED ORDER — PANTOPRAZOLE SODIUM 40 MG PO TBEC
40.0000 mg | DELAYED_RELEASE_TABLET | Freq: Every day | ORAL | Status: DC
  Administered 2011-02-27: 40 mg via ORAL
  Filled 2011-02-26: qty 1

## 2011-02-26 MED ORDER — ACETAMINOPHEN 325 MG PO TABS: 650.0000 mg | ORAL_TABLET | Freq: Four times a day (QID) | ORAL | Status: DC | PRN

## 2011-02-26 MED ORDER — SENNA 8.6 MG PO TABS
1.0000 | ORAL_TABLET | Freq: Two times a day (BID) | ORAL | Status: DC
  Administered 2011-02-26 – 2011-02-27 (×2): 8.6 mg via ORAL
  Filled 2011-02-26 (×3): qty 1

## 2011-02-26 MED ORDER — FLEET ENEMA 7-19 GM/118ML RE ENEM: 1.0000 | ENEMA | Freq: Two times a day (BID) | RECTAL | Status: DC | PRN

## 2011-02-26 MED ORDER — SODIUM CHLORIDE 0.9 % IV SOLN: INTRAVENOUS | Status: DC

## 2011-02-26 MED ORDER — DEXTROSE-NACL 5-0.45 % IV SOLN: INTRAVENOUS | Status: DC

## 2011-02-26 MED ORDER — ACETAMINOPHEN 650 MG RE SUPP: 650.0000 mg | Freq: Four times a day (QID) | RECTAL | Status: DC | PRN

## 2011-02-26 MED ORDER — CARVEDILOL 25 MG PO TABS
25.0000 mg | ORAL_TABLET | Freq: Two times a day (BID) | ORAL | Status: DC
  Filled 2011-02-26 (×2): qty 1

## 2011-02-26 MED ORDER — ONDANSETRON HCL 4 MG/2ML IJ SOLN
4.0000 mg | Freq: Once | INTRAMUSCULAR | Status: AC
  Administered 2011-02-26: 4 mg via INTRAVENOUS
  Filled 2011-02-26: qty 2

## 2011-02-26 MED ORDER — POLYETHYLENE GLYCOL 3350 17 G PO PACK
17.0000 g | PACK | Freq: Three times a day (TID) | ORAL | Status: DC
  Administered 2011-02-26 – 2011-02-27 (×2): 17 g via ORAL
  Filled 2011-02-26 (×4): qty 1

## 2011-02-26 MED ORDER — LORAZEPAM 0.5 MG PO TABS: 0.5000 mg | ORAL_TABLET | Freq: Two times a day (BID) | ORAL | Status: DC | PRN

## 2011-02-26 MED ORDER — ONDANSETRON HCL 4 MG/2ML IJ SOLN: 4.0000 mg | Freq: Four times a day (QID) | INTRAMUSCULAR | Status: DC | PRN

## 2011-02-26 MED ORDER — GLYBURIDE 5 MG PO TABS
10.0000 mg | ORAL_TABLET | Freq: Two times a day (BID) | ORAL | Status: DC
  Administered 2011-02-27: 10 mg via ORAL
  Filled 2011-02-26 (×3): qty 2

## 2011-02-26 MED ORDER — DEXTROSE-NACL 5-0.45 % IV SOLN
INTRAVENOUS | Status: DC
  Administered 2011-02-26: 20:00:00 via INTRAVENOUS

## 2011-02-26 MED ORDER — VITAMIN B-12 1000 MCG PO TABS
1000.0000 ug | ORAL_TABLET | Freq: Every day | ORAL | Status: DC
  Administered 2011-02-27: 1000 ug via ORAL
  Filled 2011-02-26: qty 1

## 2011-02-26 MED ORDER — AMLODIPINE BESYLATE 10 MG PO TABS
10.0000 mg | ORAL_TABLET | Freq: Every day | ORAL | Status: DC
  Administered 2011-02-27: 10 mg via ORAL
  Filled 2011-02-26: qty 1

## 2011-02-26 MED ORDER — ENOXAPARIN SODIUM 40 MG/0.4ML ~~LOC~~ SOLN
40.0000 mg | SUBCUTANEOUS | Status: DC
  Administered 2011-02-26: 40 mg via SUBCUTANEOUS
  Filled 2011-02-26 (×2): qty 0.4

## 2011-02-26 MED ORDER — SODIUM CHLORIDE 0.9 % IV SOLN
INTRAVENOUS | Status: AC
  Administered 2011-02-26: 18:00:00 via INTRAVENOUS
  Filled 2011-02-26: qty 1

## 2011-02-26 MED ORDER — SIMVASTATIN 40 MG PO TABS
40.0000 mg | ORAL_TABLET | Freq: Every day | ORAL | Status: DC
  Administered 2011-02-26: 40 mg via ORAL
  Filled 2011-02-26 (×2): qty 1

## 2011-02-26 MED ORDER — ASPIRIN 325 MG PO TABS
325.0000 mg | ORAL_TABLET | Freq: Every day | ORAL | Status: DC
  Administered 2011-02-27: 325 mg via ORAL
  Filled 2011-02-26: qty 1

## 2011-02-26 MED ORDER — SORBITOL 70 % SOLN
30.0000 mL | Freq: Every day | Status: DC | PRN
  Filled 2011-02-26: qty 30

## 2011-02-26 MED ORDER — ALBUTEROL SULFATE (5 MG/ML) 0.5% IN NEBU: 2.5000 mg | INHALATION_SOLUTION | RESPIRATORY_TRACT | Status: DC | PRN

## 2011-02-26 MED ORDER — PHENYTOIN SODIUM EXTENDED 100 MG PO CAPS
300.0000 mg | ORAL_CAPSULE | Freq: Every day | ORAL | Status: DC
  Administered 2011-02-26: 300 mg via ORAL
  Filled 2011-02-26 (×2): qty 3

## 2011-02-26 MED ORDER — CARBAMAZEPINE 200 MG PO TABS
200.0000 mg | ORAL_TABLET | Freq: Every day | ORAL | Status: DC
  Administered 2011-02-27: 200 mg via ORAL
  Filled 2011-02-26: qty 1

## 2011-02-26 MED ORDER — PHENYTOIN SODIUM EXTENDED 30 MG PO CAPS
30.0000 mg | ORAL_CAPSULE | Freq: Every day | ORAL | Status: DC
  Administered 2011-02-26: 30 mg via ORAL
  Filled 2011-02-26 (×3): qty 1

## 2011-02-26 MED ORDER — INSULIN REGULAR BOLUS VIA INFUSION
0.0000 [IU] | Freq: Three times a day (TID) | INTRAVENOUS | Status: DC
  Filled 2011-02-26 (×4): qty 10

## 2011-02-26 MED ORDER — TAMSULOSIN HCL 0.4 MG PO CAPS
0.4000 mg | ORAL_CAPSULE | Freq: Every day | ORAL | Status: DC
  Administered 2011-02-27: 0.4 mg via ORAL
  Filled 2011-02-26: qty 1

## 2011-02-26 MED ORDER — INSULIN REGULAR BOLUS VIA INFUSION
0.0000 [IU] | Freq: Three times a day (TID) | INTRAVENOUS | Status: DC
Start: 2011-02-27 — End: 2011-02-26

## 2011-02-26 MED ORDER — FLEET ENEMA 7-19 GM/118ML RE ENEM
1.0000 | ENEMA | Freq: Once | RECTAL | Status: AC
  Administered 2011-02-26: 21:00:00 via RECTAL
  Filled 2011-02-26: qty 1

## 2011-02-26 MED ORDER — FLEET ENEMA 7-19 GM/118ML RE ENEM: 1.0000 | ENEMA | Freq: Once | RECTAL | Status: DC

## 2011-02-26 MED ORDER — HYDROCHLOROTHIAZIDE 25 MG PO TABS
25.0000 mg | ORAL_TABLET | Freq: Every day | ORAL | Status: DC
  Administered 2011-02-27: 25 mg via ORAL
  Filled 2011-02-26: qty 1

## 2011-02-26 MED ORDER — SODIUM CHLORIDE 0.9 % IV BOLUS (SEPSIS)
1000.0000 mL | Freq: Once | INTRAVENOUS | Status: AC
  Administered 2011-02-26: 1000 mL via INTRAVENOUS

## 2011-02-26 MED ORDER — ONDANSETRON HCL 4 MG PO TABS: 4.0000 mg | ORAL_TABLET | Freq: Four times a day (QID) | ORAL | Status: DC | PRN

## 2011-02-26 NOTE — ED Notes (Signed)
266 ml noted on bladder scan.

## 2011-02-26 NOTE — ED Notes (Signed)
States no bowel movement x 10 days. States last bm was normal. Denies history of constipation.states nausea but denies diarrhea.

## 2011-02-26 NOTE — ED Notes (Signed)
Insulin gtt started via glucose stabilizer verified admx with S Hankins RN.  Pt noted in no acute distress without acute changes in pt status.

## 2011-02-26 NOTE — ED Provider Notes (Signed)
Lab and radiology results reviewed and discussed with Dr. Patrica Duel.  Case discussed with unassigned medicine ([facility])--will admit for hyperglycemia.  Jimmye Norman, NP 02/26/11 [phone]

## 2011-02-26 NOTE — ED Provider Notes (Signed)
Medical screening examination/treatment/procedure(s) were conducted as a shared visit with non-physician practitioner(s) and myself.  I personally evaluated the patient during the encounter   Malashia Kamaka A. Patrica Duel, MD 02/26/11 1941

## 2011-02-26 NOTE — ED Notes (Signed)
MD aware of bladder scan results. States no foley admx.

## 2011-02-26 NOTE — ED Notes (Signed)
Dinner tray ordered. Ok per EDP  

## 2011-02-26 NOTE — ED Provider Notes (Signed)
Patient report received from my attending. 78 year old male presenting CDU with constipation. Patient has not had a bowel movement in the past several days.  I discussed pt care with Felicie Morn, NP who will continue his management in CDU.  Pt may benefit from manual disimpaction and enema for relief.    Fayrene Helper, PA-C 02/26/11 1526

## 2011-02-26 NOTE — H&P (Signed)
PATIENT DETAILS Name: [name] Age: 78 y.o. Sex: male Date of Birth: 01-24-1929 Admit Date: 02/26/2011 WUJ:WJXBJY,NWGN [name], MD, MD   CHIEF COMPLAINT:  Constipation, along with polyuria and polydipsia for a week  HPI: Patient is a 78 year old Caucasian male, with a past medical history of seizure disorder, coronary artery disease, hypertension, diabetes-who was discharged from this facility around 2 weeks ago comes back with the above-noted complaints. Patient complains that for the past 7 or 8 days he has not had a bowel movement, and he has gone to his primary care practitioner who has given him milk of magnesia and several Dulcolax suppositories, however he still has not had a bowel movement. During the same time patient claims that he has polyuria and polydipsia. In the ED upon presentation he was found to have a CBG of more than 400. A abdominal x-ray was done which does not show any obstruction but does show a significant stool burden. Patient does not have any nausea or vomiting. He does not have any abdominal pain. He continues to pass some flatus. Because of the significantly elevated blood sugars he is being admitted to the hospitalist service for further evaluation and treatment.   ALLERGIES:  No Known Allergies  PAST MEDICAL HISTORY: Past Medical History  Diagnosis Date  . CAD (coronary artery disease)     s/p cabg 3/10...s/p MI age 78  . Seizure disorder   . Diabetes mellitus type II   . HTN (hypertension)   . HLD (hyperlipidemia)   . Anxiety disorder   . Tremor   . GERD (gastroesophageal reflux disease)   . Pulmonary vein stenosis   . Seizures   . Angina   . Chronic kidney disease   . Anxiety   . Myocardial infarction     PAST SURGICAL HISTORY: Past Surgical History  Procedure Date  . Coronary artery bypass graft 3/10  . No past surgeries     MEDICATIONS AT HOME: Prior to Admission medications   Medication Sig Start Date End Date Taking?  Authorizing Provider  amLODipine (NORVASC) 10 MG tablet Take 10 mg by mouth daily.    Yes Historical Provider, MD  aspirin 325 MG tablet Take 325 mg by mouth daily.     Yes Historical Provider, MD  carbamazepine (TEGRETOL) 200 MG tablet Take 200-400 mg by mouth 2 (two) times daily. 1 tablet in the morning and 2 tablets at night   Yes Historical Provider, MD  carvedilol (COREG) 25 MG tablet Take 25 mg by mouth 2 (two) times daily with a meal.    Yes Historical Provider, MD  glyBURIDE (DIABETA) 5 MG tablet Take 10 mg by mouth 2 (two) times daily with a meal.    Yes Historical Provider, MD  hydrALAZINE (APRESOLINE) 50 MG tablet Take 25 mg by mouth 3 (three) times daily.   Yes Historical Provider, MD  hydrochlorothiazide (HYDRODIURIL) 25 MG tablet Take 25 mg by mouth daily.   Yes Historical Provider, MD  LORazepam (ATIVAN) 1 MG tablet Take 0.5-1 mg by mouth 2 (two) times daily as needed. For anxiety.   Yes Historical Provider, MD  mirtazapine (REMERON SOL-TAB) 15 MG disintegrating tablet Take 15 mg by mouth every evening.   Yes Historical Provider, MD  pantoprazole (PROTONIX) 40 MG tablet Take 40 mg by mouth daily.    Yes Historical Provider, MD  phenytoin (DILANTIN) 100 MG ER capsule Take 300 mg by mouth at bedtime.   Yes Historical Provider, MD  phenytoin (DILANTIN) 30 MG  ER capsule Take 30 mg by mouth every evening.    Yes Historical Provider, MD  simvastatin (ZOCOR) 40 MG tablet Take 40 mg by mouth at bedtime.    Yes Historical Provider, MD  Tamsulosin HCl (FLOMAX) 0.4 MG CAPS Take 0.4 mg by mouth daily.   Yes Historical Provider, MD  vitamin B-12 (CYANOCOBALAMIN) 1000 MCG tablet Take 1,000 mcg by mouth daily.    Yes Historical Provider, MD    FAMILY HISTORY: Family History  Problem Relation Age of Onset  . Coronary artery disease    . Hypertension      SOCIAL HISTORY:  reports that he has never smoked. He has quit using smokeless tobacco. His smokeless tobacco use included Chew. He reports  that he drinks alcohol. His drug history not on file.  REVIEW OF SYSTEMS:  Constitutional:   No  weight loss, night sweats,  Fevers, chills, fatigue.  HEENT:    No headaches, Difficulty swallowing,Tooth/dental problems,Sore throat,  No sneezing, itching, ear ache, nasal congestion, post nasal drip,   Cardio-vascular: No chest pain,  Orthopnea, PND, swelling in lower extremities, anasarca, dizziness, palpitations  GI:  No heartburn, indigestion, abdominal pain, nausea, vomiting, diarrhea,  loss of appetite  Resp: No shortness of breath with exertion or at rest.  No excess mucus, no productive cough, No non-productive cough,  No coughing up of blood.No change in color of mucus.No wheezing.No chest wall deformity  Skin:  no rash or lesions.  GU:  no dysuria, change in color of urine, no urgency or frequency.  No flank pain.  Musculoskeletal: No joint pain or swelling.  No decreased range of motion.  No back pain.  Psych: No change in mood or affect. No depression or anxiety.  No memory loss.   PHYSICAL EXAM: Blood pressure 145/64, pulse 59, temperature 98.1 F (36.7 C), temperature source Oral, resp. rate 18, SpO2 100.00%.  General appearance :Awake, alert, not in any distress. Speech Clear. Not toxic Looking HEENT: Atraumatic and Normocephalic, pupils equally reactive to light and accomodation Neck: supple, no JVD. No cervical lymphadenopathy.  Chest:Good air entry bilaterally, no added sounds  CVS: S1 S2 regular, no murmurs.  Abdomen: Bowel sounds present, mild tenderness in the pelvic region, however not distended with no gaurding, rigidity or rebound. Extremities: B/L Lower Ext shows no edema, both legs are warm to touch, with  dorsalis pedis pulses palpable. Neurology: Awake alert, and oriented X 3, CN II-XII intact, Non focal, Deep Tendon Reflex-2+ all over, plantar's downgoing B/L, sensory exam is grossly intact.  Skin:No Rash Wounds:N/A  LABS ON ADMISSION:    Basename 02/26/11 1316  NA 135  K 5.6*  CL 99  CO2 22  GLUCOSE 441*  BUN 25*  CREATININE 1.34  CALCIUM 9.3  MG --  PHOS --    Basename 02/26/11 1316  AST 13  ALT 13  ALKPHOS 119*  BILITOT 0.2*  PROT 7.0  ALBUMIN 3.4*    Basename 02/26/11 1316  LIPASE 22  AMYLASE --    Basename 02/26/11 1316  WBC 9.6  NEUTROABS 6.8  HGB 9.7*  HCT 28.9*  MCV 90.6  PLT 246   No results found for this basename: CKTOTAL:3,CKMB:3,CKMBINDEX:3,TROPONINI:3 in the last 72 hours No results found for this basename: DDIMER:2 in the last 72 hours No components found with this basename: POCBNP:3   RADIOLOGIC STUDIES ON ADMISSION: Ct Head Wo Contrast  02/09/2011  *RADIOLOGY REPORT*  Clinical Data: Weakness  CT HEAD WITHOUT CONTRAST  Technique:  Contiguous  axial images were obtained from the base of the skull through the vertex without contrast.  Comparison: MRI from 09/21/2010.  CT scan from 09/21/2010.  Findings: There is no evidence for acute hemorrhage, hydrocephalus, mass lesion, or abnormal extra-axial fluid collection.  No definite CT evidence for acute infarction.  Diffuse loss of parenchymal volume is consistent with atrophy. Patchy low attenuation in the deep hemispheric and periventricular white matter is nonspecific, but likely reflects chronic microvascular ischemic demyelination.  The visualized paranasal sinuses and mastoid air cells are clear.  IMPRESSION: Stable.  No acute intracranial abnormality.  Atrophy.  Original Report Authenticated By: ERIC A. MANSELL, M.D.   Dg Chest Port 1 View  02/09/2011  *RADIOLOGY REPORT*  Clinical Data: Chest pain, shortness of breath  PORTABLE CHEST - 1 VIEW  Comparison: Portable exam 1743 hours compared to 08/27/2010  Findings: Normal heart size post CABG. Mediastinal contours and pulmonary vascularity normal. Minimal peribronchial thickening without infiltrate or effusion. No pneumothorax. Bones unremarkable.  IMPRESSION: Post CABG. Minimal chronic  bronchitic changes without acute infiltrate.  Original Report Authenticated By: Lollie Marrow, M.D.   Dg Abd Acute W/chest  02/26/2011  *RADIOLOGY REPORT*  Clinical Data: Abdominal pain.  Constipation.  Nausea.  No bowel movement for 1 week.  ACUTE ABDOMEN SERIES (ABDOMEN 2 VIEW & CHEST 1 VIEW)  Comparison: 04/10/2008  Findings: The patient has had median sternotomy and CABG.  Heart size is normal.  There are no focal consolidations or pleural effusions.  No free intraperitoneal air beneath the diaphragm.  Supine and erect views of the abdomen show a nonobstructive bowel gas pattern.  There is significant stool burden.  Large amount of stool seen within the rectosigmoid colon. Visualized osseous structures have a normal appearance.  IMPRESSION:  1.  Status post CABG. 2. No evidence for acute cardiopulmonary abnormality. 3.  Nonobstructive bowel gas pattern with significant stool burden.  Original Report Authenticated By: Patterson Hammersmith, M.D.    ASSESSMENT AND PLAN: Present on Admission:  .Type 2 diabetes mellitus with hyperosmolar nonketotic hyperglycemia -Patient will be placed on IV insulin, glucose stable as a protocol. IV fluids have also been ordered. Once his sugars are in the range, he will need to be transitioned to subcutaneous insulin.   .Constipation -Will place him on scheduled MiraLax and Senokot, we will Fleet enemas. If Fleet enemas do not work, we will then try soapsuds enemas. If this is persistent, he will need a CT of his abdomen to make sure that there is no obstruction. However a plain abdominal x-ray does not show any features of obstruction  .HYPERLIPIDEMIA-MIXED -Resume statin   .HYPERTENSION, BENIGN -Resume home medications   .SEIZURE DISORDER -We'll resume phenytoin and carbamazepine   .CAD, AUTOLOGOUS BYPASS GRAFT -Continue with aspirin and statin along with Coreg.   Further plan will depend as patient's clinical course evolves and further radiologic and  laboratory data become available. Patient will be monitored closely.  DVT Prophylaxis: Lovenox  Code Status: Full code  Total time spent for admission equals 45 minutes.  Jeoffrey Massed 02/26/2011, 6:45 PM

## 2011-02-26 NOTE — ED Notes (Signed)
FNP at bedside to discuss plan of care.

## 2011-02-26 NOTE — ED Provider Notes (Signed)
History     CSN: [id number]  Arrival date & time 02/26/11  1244   First MD Initiated Contact with Patient 02/26/11 1304      Chief Complaint  Patient presents with  . Constipation   patient states he has had no bowel movement for 7-10 days. He states when he tries to use the bathroom nothing comes out. He states when he wipes. There is some loose stool in the paper, but denies any meaningful bowel movement. He is passing gas. Patient feels weak and appears to be slightly pale, but denies any melena or hematochezia. He feels his belly is slightly distended and has had a decreased appetite. He's had no vomiting. No weakness or syncope. Denies any chest pain or shortness of breath. He's had no numbness, weakness or tingling. Patient, states she's been using some over-the-counter meds, with no relief. He denies any problem like this in the past  (Consider location/radiation/quality/duration/timing/severity/associated sxs/prior treatment) HPI  Past Medical History  Diagnosis Date  . CAD (coronary artery disease)     s/p cabg 3/10...s/p MI age 22  . Seizure disorder   . Diabetes mellitus type II   . HTN (hypertension)   . HLD (hyperlipidemia)   . Anxiety disorder   . Tremor   . GERD (gastroesophageal reflux disease)   . Pulmonary vein stenosis   . Seizures   . Angina   . Chronic kidney disease   . Anxiety   . Myocardial infarction     Past Surgical History  Procedure Date  . Coronary artery bypass graft 3/10  . No past surgeries     Family History  Problem Relation Age of Onset  . Coronary artery disease    . Hypertension      History  Substance Use Topics  . Smoking status: Never Smoker   . Smokeless tobacco: Former Neurosurgeon    Types: Chew  . Alcohol Use: 0.0 oz/week     remote alcohol history but has not drank any for many years.      Review of Systems  All other systems reviewed and are negative.    Allergies  Review of patient's allergies indicates no known  allergies.  Home Medications   Current Outpatient Rx  Name Route Sig Dispense Refill  . AMLODIPINE BESYLATE 10 MG PO TABS Oral Take 10 mg by mouth daily.     . ASPIRIN 325 MG PO TABS Oral Take 325 mg by mouth daily.      Marland Kitchen CARBAMAZEPINE 200 MG PO TABS Oral Take 200-400 mg by mouth 2 (two) times daily. 1 tablet in the morning and 2 tablets at night    . CARVEDILOL 25 MG PO TABS Oral Take 25 mg by mouth 2 (two) times daily with a meal.     . GLYBURIDE 5 MG PO TABS Oral Take 10 mg by mouth 2 (two) times daily with a meal.     . HYDRALAZINE HCL 50 MG PO TABS Oral Take 25 mg by mouth 3 (three) times daily.    Marland Kitchen HYDROCHLOROTHIAZIDE 25 MG PO TABS Oral Take 25 mg by mouth daily.    Marland Kitchen LORAZEPAM 1 MG PO TABS Oral Take 0.5-1 mg by mouth 2 (two) times daily as needed. For anxiety.    Marland Kitchen MIRTAZAPINE 15 MG PO TBDP Oral Take 15 mg by mouth every evening.    Marland Kitchen PANTOPRAZOLE SODIUM 40 MG PO TBEC Oral Take 40 mg by mouth daily.     Marland Kitchen PHENYTOIN SODIUM EXTENDED  100 MG PO CAPS Oral Take 300 mg by mouth at bedtime.    Marland Kitchen PHENYTOIN SODIUM EXTENDED 30 MG PO CAPS Oral Take 30 mg by mouth every evening.     Marland Kitchen SIMVASTATIN 40 MG PO TABS Oral Take 40 mg by mouth at bedtime.     . TAMSULOSIN HCL 0.4 MG PO CAPS Oral Take 0.4 mg by mouth daily.    Marland Kitchen VITAMIN B-12 1000 MCG PO TABS Oral Take 1,000 mcg by mouth daily.       BP 149/60  Pulse 64  Temp(Src) 98.9 F (37.2 C) (Oral)  SpO2 100%  Physical Exam  Nursing note and vitals reviewed. Constitutional: He is oriented to person, place, and time. He appears well-developed and well-nourished.  HENT:  Head: Normocephalic and atraumatic.  Eyes: Conjunctivae and EOM are normal. Pupils are equal, round, and reactive to light.  Neck: Neck supple.  Cardiovascular: Normal rate and regular rhythm.  Exam reveals no gallop and no friction rub.   No murmur heard. Pulmonary/Chest: Breath sounds normal. He has no wheezes. He has no rales. He exhibits no tenderness.  Abdominal:  Soft. Bowel sounds are normal. He exhibits no distension. There is no tenderness. There is no rebound and no guarding.  Musculoskeletal: Normal range of motion.  Neurological: He is alert and oriented to person, place, and time. No cranial nerve deficit. Coordination normal.  Skin: Skin is warm and dry. No rash noted. There is pallor.  Psychiatric: He has a normal mood and affect.    ED Course  Procedures (including critical care time)   Labs Reviewed  CBC  DIFFERENTIAL  COMPREHENSIVE METABOLIC PANEL  LIPASE, BLOOD   No results found.   No diagnosis found.    MDM  Pt is seen and examined;  Initial history and physical completed.  Will follow.       Initial workup to include IV fluids, x-rays, labs. We'll check a rectal exam, and we'll move the patient to the CDU for further evaluation.   Yazlyn Wentzel A. Patrica Duel, MD 02/26/11 5784

## 2011-02-26 NOTE — ED Notes (Signed)
To ed for eval of no BM for past week.

## 2011-02-26 NOTE — ED Provider Notes (Signed)
Medical screening examination/treatment/procedure(s) were conducted as a shared visit with non-physician practitioner(s) and myself.  I personally evaluated the patient during the encounter   Enas Winchel A. Patrica Duel, MD 02/26/11 1610

## 2011-02-26 NOTE — ED Notes (Signed)
Received report and assumed patient care. Pt noted alert and oriented and in no acute distress. Denies pain at this time. Denies needs. SR up. Will monitor.

## 2011-02-26 NOTE — ED Notes (Signed)
[id number] Ready 

## 2011-02-26 NOTE — ED Notes (Signed)
Nurse unable to take report  

## 2011-02-27 LAB — CBC
HCT: 28.1 % — ABNORMAL LOW (ref 39.0–52.0)
MCH: 30.6 pg (ref 26.0–34.0)
MCHC: 33.5 g/dL (ref 30.0–36.0)
MCV: 91.5 fL (ref 78.0–100.0)
RDW: 12.9 % (ref 11.5–15.5)

## 2011-02-27 LAB — GLUCOSE, CAPILLARY
Glucose-Capillary: 167 mg/dL — ABNORMAL HIGH (ref 70–99)
Glucose-Capillary: 152 mg/dL — ABNORMAL HIGH (ref 70–99)
Glucose-Capillary: 114 mg/dL — ABNORMAL HIGH (ref 70–99)
Glucose-Capillary: 322 mg/dL — ABNORMAL HIGH (ref 70–99)

## 2011-02-27 LAB — BASIC METABOLIC PANEL
BUN: 20 mg/dL (ref 6–23)
Calcium: 9.4 mg/dL (ref 8.4–10.5)
Chloride: 106 mEq/L (ref 96–112)
Creatinine, Ser: 1.27 mg/dL (ref 0.50–1.35)
GFR calc Af Amer: 60 mL/min — ABNORMAL LOW (ref >90)
GFR calc non Af Amer: 52 mL/min — ABNORMAL LOW (ref >90)

## 2011-02-27 LAB — HEMOGLOBIN A1C: Hgb A1c MFr Bld: 9.2 % — ABNORMAL HIGH (ref <5.7)

## 2011-02-27 MED ORDER — INSULIN ASPART 100 UNIT/ML ~~LOC~~ SOLN
0.0000 [IU] | Freq: Three times a day (TID) | SUBCUTANEOUS | Status: DC
  Filled 2011-02-27: qty 3

## 2011-02-27 MED ORDER — SENNA 8.6 MG PO TABS: 1.0000 | ORAL_TABLET | Freq: Two times a day (BID) | ORAL | Status: DC

## 2011-02-27 MED ORDER — INSULIN ASPART 100 UNIT/ML ~~LOC~~ SOLN: 0.0000 [IU] | Freq: Every day | SUBCUTANEOUS | Status: DC

## 2011-02-27 MED ORDER — POLYETHYLENE GLYCOL 3350 17 G PO PACK: 17.0000 g | PACK | ORAL | Status: AC

## 2011-02-27 MED ORDER — INSULIN ASPART 100 UNIT/ML ~~LOC~~ SOLN
3.0000 [IU] | Freq: Three times a day (TID) | SUBCUTANEOUS | Status: DC
  Administered 2011-02-27 (×2): 3 [IU] via SUBCUTANEOUS

## 2011-02-27 MED ORDER — INSULIN ASPART 100 UNIT/ML ~~LOC~~ SOLN: 3.0000 [IU] | Freq: Three times a day (TID) | SUBCUTANEOUS | Status: DC

## 2011-02-27 MED ORDER — INSULIN GLARGINE 100 UNIT/ML ~~LOC~~ SOLN
10.0000 [IU] | Freq: Every day | SUBCUTANEOUS | Status: DC
Start: 2011-02-27 — End: 2011-02-27
  Administered 2011-02-27: 10 [IU] via SUBCUTANEOUS
  Filled 2011-02-27: qty 3

## 2011-02-27 MED ORDER — INSULIN ASPART 100 UNIT/ML ~~LOC~~ SOLN
0.0000 [IU] | Freq: Three times a day (TID) | SUBCUTANEOUS | Status: DC
  Administered 2011-02-27: 7 [IU] via SUBCUTANEOUS
  Administered 2011-02-27: 3 [IU] via SUBCUTANEOUS

## 2011-02-27 MED ORDER — INSULIN GLARGINE 100 UNIT/ML ~~LOC~~ SOLN: 10.0000 [IU] | Freq: Every day | SUBCUTANEOUS | Status: DC

## 2011-02-27 NOTE — Progress Notes (Signed)
Physical Therapy Evaluation Patient Details Name: [name] MRN: [id number] DOB: Apr 19, 1928 Today's Date: 02/27/2011  Problem List:  Patient Active Problem List  Diagnoses  . AODM  . HYPERLIPIDEMIA-MIXED  . HYPOPOTASSEMIA  . HYPERTENSION, BENIGN  . BEN HTN HEART DISEASE WITHOUT HEART FAIL  . CORONARY ATHEROSCLEROSIS NATIVE CORONARY ARTERY  . CAD, AUTOLOGOUS BYPASS GRAFT  . SEIZURE DISORDER  . ABNORMALITY OF GAIT  . ATAXIA  . Anemia  . Type 2 diabetes mellitus with hyperosmolar nonketotic hyperglycemia  . Constipation    Past Medical History:  Past Medical History  Diagnosis Date  . CAD (coronary artery disease)     s/p cabg 3/10...s/p MI age 43  . Seizure disorder   . Diabetes mellitus type II   . HTN (hypertension)   . HLD (hyperlipidemia)   . Anxiety disorder   . Tremor   . GERD (gastroesophageal reflux disease)   . Pulmonary vein stenosis   . Seizures   . Angina   . Chronic kidney disease   . Anxiety   . Myocardial infarction    Past Surgical History:  Past Surgical History  Procedure Date  . Coronary artery bypass graft 3/10  . No past surgeries     PT Assessment/Plan/Recommendation PT Assessment Clinical Impression Statement: Patient presents with constipation, poluria, and polydipsia. He presents with significant deficits in balance that inhibit his ability to safelt mobilize. I would highly recommend HH PT follow up for balance. Pt requires PT in the acute care setting to maximize safe and independent gait function secondary ro improved balance PT Recommendation/Assessment: Patient will need skilled PT in the acute care venue PT Problem List: Decreased balance;Decreased activity tolerance;Decreased mobility PT Therapy Diagnosis : Abnormality of gait;Difficulty walking PT Plan PT Frequency: Min 3X/week PT Treatment/Interventions: Gait training;Stair training;Functional mobility training;Therapeutic activities;Therapeutic exercise;Balance  training;Patient/family education PT Recommendation Follow Up Recommendations: Home health PT Equipment Recommended: None recommended by PT PT Goals  Acute Rehab PT Goals PT Goal Formulation: With patient Time For Goal Achievement: 2 weeks Pt will go Sit to Stand: Independently PT Goal: Sit to Stand - Progress: Not met Pt will go Stand to Sit: Independently PT Goal: Stand to Sit - Progress: Not met Pt will Ambulate: >150 feet;with modified independence;with cane PT Goal: Ambulate - Progress: Not met Additional Goals Additional Goal #1: Patient will score at least 44 on the Berg balance test to indicate need for cane only for safe mobility PT Goal: Additional Goal #1 - Progress: Not met  PT Evaluation Precautions/Restrictions  Precautions Precautions: Fall Prior Functioning  Home Living Lives With: Spouse;Daughter Receives Help From: Family Type of Home: Mobile home Home Layout: One level Home Access: Ramped entrance Bathroom Shower/Tub: Health visitor: Standard Bathroom Accessibility: Yes How Accessible: Accessible via walker;Accessible via wheelchair Home Adaptive Equipment: Bedside commode/3-in-1;Shower chair with back;Wheelchair - manual;Walker - rolling;Straight cane Prior Function Level of Independence: Independent with basic ADLs;Independent with transfers;Needs assistance with gait;Needs assistance with homemaking Driving: Yes (Rare occasions) Vocation: Retired Comments: Wife has advanced home care, so if [facility] PT ordered would prefer this company Cognition Cognition Arousal/Alertness: Awake/alert Overall Cognitive Status: Appears within functional limits for tasks assessed Orientation Level: Oriented X4 Sensation/Coordination Sensation Light Touch: Appears Intact Stereognosis: Not tested Hot/Cold: Not tested Proprioception: Not tested Coordination Gross Motor Movements are Fluid and Coordinated: Yes Fine Motor Movements are Fluid and  Coordinated: Yes Extremity Assessment RLE Assessment RLE Assessment: Within Functional Limits LLE Assessment LLE Assessment: Within Functional Limits Mobility (including Balance)  Bed Mobility Supine to Sit: 7: Independent Sit to Supine: 7: Independent Transfers Sit to Stand: 5: Supervision;From bed;From toilet Stand to Sit: 5: Supervision;To bed Ambulation/Gait Ambulation/Gait Assistance: 5: Supervision Ambulation/Gait Assistance Details (indicate cue type and reason): Patient with correct and safe use with cane incresed instability with gait with turns. no right foot dorsiflexion with heel strike of plantarflexion at toe off. Ambulation Distance (Feet): 150 Feet Assistive device: Straight cane Gait Pattern: Decreased stride length;Right foot flat;Left steppage  Posture/Postural Control Posture/Postural Control: Postural limitations Postural Limitations: Posterior lean in standing with tendency to brace legs against back of bed. Unable to stand in romberg without assistance to place feet and tendency to lean posterior. With standing and turning to look over shoulder to left loss of balance. Overall decreased foot/ankle and hip strategies End of Session PT - End of Session Equipment Utilized During Treatment: Gait belt Activity Tolerance: Patient tolerated treatment well Patient left: in bed;with call bell in reach;with bed alarm set Nurse Communication: Mobility status for ambulation (Nurse tech) General Behavior During Session: [facility] for tasks performed Cognition: [facility] for tasks performed  Edwyna Perfect, PT  Pager [phone]  02/27/2011, 8:41 AM

## 2011-02-27 NOTE — Progress Notes (Signed)
Subjective: Patient is a 78 year old Caucasian male, with a past medical history of seizure disorder, coronary artery disease, hypertension, diabetes-who was discharged from this facility around 2 weeks ago comes back with the above-noted complaints. Patient complains that for the past 7 or 8 days he has not had a bowel movement, and he has gone to his primary care practitioner who has given him milk of magnesia and several Dulcolax suppositories, however he still has not had a bowel movement. During the same time patient claims that he has polyuria and polydipsia. In the ED upon presentation he was found to have a CBG of more than 400. A abdominal x-ray was done which does not show any obstruction but does show a significant stool burden. Patient does not have any nausea or vomiting. He does not have any abdominal pain. He continues to pass some flatus. Because of the significantly elevated blood sugars he is being admitted to the hospitalist service for further evaluation and treatment.      Had a few Bms over night Feels much better Requesting to go home.      Physical Exam: Blood pressure 144/51, pulse 60, temperature 98 F (36.7 C), temperature source Oral, resp. rate 18, height 5\' 11"  (1.803 m), weight 31.979 kg (70 lb 8 oz), SpO2 100.00%. Patient Vitals for the past 24 hrs:  BP Temp Temp src Pulse Resp SpO2 Height Weight  02/27/11 0625 144/51 mmHg 98 F (36.7 C) Oral 60  18  100 % - -  02/26/11 2138 113/59 mmHg 96.2 F (35.7 C) Oral 68  18  100 % 5\' 11"  (1.803 m) 31.979 kg (70 lb 8 oz)  02/26/11 1831 145/64 mmHg - - 59  - 100 % - -  02/26/11 1500 134/53 mmHg - - 63  - 98 % - -  02/26/11 1447 141/49 mmHg 98.1 F (36.7 C) Oral 63  18  100 % - -  02/26/11 1250 149/60 mmHg 98.9 F (37.2 C) Oral 64  - 100 % - -      Investigations:  No results found for this or any previous visit (from the past 240 hour(s)).   Basic Metabolic Panel:  Basename 02/27/11 0600 02/26/11 2021  02/26/11 1316  NA 140 -- 135  K 5.0 -- 5.6*  CL 106 -- 99  CO2 26 -- 22  GLUCOSE 95 -- 441*  BUN 20 -- 25*  CREATININE 1.27 1.22 --  CALCIUM 9.4 -- 9.3  MG -- -- --  PHOS -- -- --   Liver Function Tests:  Basename 02/26/11 1316  AST 13  ALT 13  ALKPHOS 119*  BILITOT 0.2*  PROT 7.0  ALBUMIN 3.4*     CBC:  Basename 02/27/11 0600 02/26/11 2021 02/26/11 1316  WBC 9.1 8.5 --  NEUTROABS -- -- 6.8  HGB 9.4* 9.5* --  HCT 28.1* 28.2* --  MCV 91.5 90.4 --  PLT 246 257 --    Dg Abd Acute W/chest  02/26/2011  *RADIOLOGY REPORT*  Clinical Data: Abdominal pain.  Constipation.  Nausea.  No bowel movement for 1 week.  ACUTE ABDOMEN SERIES (ABDOMEN 2 VIEW & CHEST 1 VIEW)  Comparison: 04/10/2008  Findings: The patient has had median sternotomy and CABG.  Heart size is normal.  There are no focal consolidations or pleural effusions.  No free intraperitoneal air beneath the diaphragm.  Supine and erect views of the abdomen show a nonobstructive bowel gas pattern.  There is significant stool burden.  Large amount of  stool seen within the rectosigmoid colon. Visualized osseous structures have a normal appearance.  IMPRESSION:  1.  Status post CABG. 2. No evidence for acute cardiopulmonary abnormality. 3.  Nonobstructive bowel gas pattern with significant stool burden.  Original Report Authenticated By: Patterson Hammersmith, M.D.      Medications:  Scheduled:    . sodium chloride   Intravenous Once  . amLODipine  10 mg Oral Daily  . aspirin  325 mg Oral Daily  . carbamazepine  200 mg Oral Daily  . carbamazepine  400 mg Oral QHS  . carvedilol  25 mg Oral BID WC  . enoxaparin  40 mg Subcutaneous Q24H  . glyBURIDE  10 mg Oral BID WC  . hydrALAZINE  25 mg Oral TID  . hydrochlorothiazide  25 mg Oral Daily  . insulin aspart  0-5 Units Subcutaneous QHS  . insulin aspart  0-9 Units Subcutaneous TID WC  . insulin aspart  3 Units Subcutaneous TID WC  . insulin glargine  10 Units Subcutaneous  Daily  . insulin (NOVOLIN-R) infusion   Intravenous To Major  . mirtazapine  15 mg Oral QHS  . ondansetron (ZOFRAN) IV  4 mg Intravenous Once  . pantoprazole  40 mg Oral Daily  . phenytoin  30 mg Oral QHS  . phenytoin  300 mg Oral QHS  . polyethylene glycol  17 g Oral TID  . senna  1 tablet Oral BID  . simvastatin  40 mg Oral QHS  . sodium chloride  1,000 mL Intravenous Once  . sodium phosphate  1 enema Rectal Once  . Tamsulosin HCl  0.4 mg Oral Daily  . vitamin B-12  1,000 mcg Oral Daily  . DISCONTD: insulin regular  0-10 Units Intravenous TID WC  . DISCONTD: insulin regular  0-10 Units Intravenous TID WC  . DISCONTD: sodium phosphate  1 enema Rectal Once   Continuous:    . DISCONTD: sodium chloride    . DISCONTD: sodium chloride 75 mL/hr at 02/26/11 1900  . DISCONTD: dextrose 5 % and 0.45% NaCl    . DISCONTD: dextrose 5 % and 0.45% NaCl 75 mL/hr at 02/27/11 0400  . DISCONTD: insulin (NOVOLIN-R) infusion 4.4 Units/hr (02/26/11 2030)    Impression:  Principal Problem:  *Type 2 diabetes mellitus with hyperosmolar nonketotic hyperglycemia Active Problems:  HYPERLIPIDEMIA-MIXED  HYPERTENSION, BENIGN  CAD, AUTOLOGOUS BYPASS GRAFT  SEIZURE DISORDER  Constipation     Plan: home     LOS: 1 day   Renesme Kerrigan, MD Pager: [phone] 02/27/2011, 8:24 AM

## 2011-02-27 NOTE — Discharge Summary (Signed)
Patient ID: BRISTOL SOY MRN: [id number] DOB/AGE: 1929-02-13 78 y.o. Primary Care Physician:WILSON,FRED [name], MD, MD Admit date: 02/26/2011 Discharge date: 02/27/2011    Discharge Diagnoses:   Principal Problem:  *Type 2 diabetes mellitus with hyperosmolar nonketotic hyperglycemia Active Problems:  HYPERLIPIDEMIA-MIXED  HYPERTENSION, BENIGN  CAD, AUTOLOGOUS BYPASS GRAFT  SEIZURE DISORDER  Constipation Anemia without active bleeding  Medication List  As of 02/27/2011 10:20 AM   START taking these medications         insulin glargine 100 UNIT/ML injection   Commonly known as: LANTUS   Inject 10 Units into the skin at bedtime.      polyethylene glycol packet   Commonly known as: MIRALAX / GLYCOLAX   Take 17 g by mouth every 1 hour x 4 doses.      senna 8.6 MG Tabs   Commonly known as: SENOKOT   Take 1 tablet (8.6 mg total) by mouth 2 (two) times daily.         CONTINUE taking these medications         amLODipine 10 MG tablet   Commonly known as: NORVASC      aspirin 325 MG tablet      carbamazepine 200 MG tablet   Commonly known as: TEGRETOL      carvedilol 25 MG tablet   Commonly known as: COREG      hydrALAZINE 50 MG tablet   Commonly known as: APRESOLINE      LORazepam 1 MG tablet   Commonly known as: ATIVAN      mirtazapine 15 MG disintegrating tablet   Commonly known as: REMERON SOL-TAB      pantoprazole 40 MG tablet   Commonly known as: PROTONIX      * phenytoin 30 MG ER capsule   Commonly known as: DILANTIN      * phenytoin 100 MG ER capsule   Commonly known as: DILANTIN      simvastatin 40 MG tablet   Commonly known as: ZOCOR      Tamsulosin HCl 0.4 MG Caps   Commonly known as: FLOMAX      vitamin B-12 1000 MCG tablet   Commonly known as: CYANOCOBALAMIN     * Notice: This list has 2 medication(s) that are the same as other medications prescribed for you. Read the directions carefully, and ask your doctor or other care provider to  review them with you.       STOP taking these medications         glyBURIDE 5 MG tablet      hydrochlorothiazide 25 MG tablet          Where to get your medications    These are the prescriptions that you need to pick up.   You may get these medications from any pharmacy.         insulin glargine 100 UNIT/ML injection   polyethylene glycol packet   senna 8.6 MG Tabs            Discharged Condition:excellent    Consults:none  Significant Diagnostic Studies: Ct Head Wo Contrast  02/09/2011  *RADIOLOGY REPORT*  Clinical Data: Weakness  CT HEAD WITHOUT CONTRAST  Technique:  Contiguous axial images were obtained from the base of the skull through the vertex without contrast.  Comparison: MRI from 09/21/2010.  CT scan from 09/21/2010.  Findings: There is no evidence for acute hemorrhage, hydrocephalus, mass lesion, or abnormal extra-axial fluid collection.  No definite CT evidence for acute infarction.  Diffuse loss of parenchymal volume is consistent with atrophy. Patchy low attenuation in the deep hemispheric and periventricular white matter is nonspecific, but likely reflects chronic microvascular ischemic demyelination.  The visualized paranasal sinuses and mastoid air cells are clear.  IMPRESSION: Stable.  No acute intracranial abnormality.  Atrophy.  Original Report Authenticated By: ERIC A. MANSELL, M.D.   Dg Chest Port 1 View  02/09/2011  *RADIOLOGY REPORT*  Clinical Data: Chest pain, shortness of breath  PORTABLE CHEST - 1 VIEW  Comparison: Portable exam 1743 hours compared to 08/27/2010  Findings: Normal heart size post CABG. Mediastinal contours and pulmonary vascularity normal. Minimal peribronchial thickening without infiltrate or effusion. No pneumothorax. Bones unremarkable.  IMPRESSION: Post CABG. Minimal chronic bronchitic changes without acute infiltrate.  Original Report Authenticated By: Lollie Marrow, M.D.   Dg Abd Acute W/chest  02/26/2011  *RADIOLOGY REPORT*   Clinical Data: Abdominal pain.  Constipation.  Nausea.  No bowel movement for 1 week.  ACUTE ABDOMEN SERIES (ABDOMEN 2 VIEW & CHEST 1 VIEW)  Comparison: 04/10/2008  Findings: The patient has had median sternotomy and CABG.  Heart size is normal.  There are no focal consolidations or pleural effusions.  No free intraperitoneal air beneath the diaphragm.  Supine and erect views of the abdomen show a nonobstructive bowel gas pattern.  There is significant stool burden.  Large amount of stool seen within the rectosigmoid colon. Visualized osseous structures have a normal appearance.  IMPRESSION:  1.  Status post CABG. 2. No evidence for acute cardiopulmonary abnormality. 3.  Nonobstructive bowel gas pattern with significant stool burden.  Original Report Authenticated By: Patterson Hammersmith, M.D.    Lab Results: Results for orders placed during the hospital encounter of 02/26/11 (from the past 48 hour(s))  CBC     Status: Abnormal   Collection Time   02/26/11  1:16 PM      Component Value Range Comment   WBC 9.6  4.0 - 10.5 (K/uL)    RBC 3.19 (*) 4.22 - 5.81 (MIL/uL)    Hemoglobin 9.7 (*) 13.0 - 17.0 (g/dL)    HCT 13.0 (*) 86.5 - 52.0 (%)    MCV 90.6  78.0 - 100.0 (fL)    MCH 30.4  26.0 - 34.0 (pg)    MCHC 33.6  30.0 - 36.0 (g/dL)    RDW 78.4  69.6 - 29.5 (%)    Platelets 246  150 - 400 (K/uL)   DIFFERENTIAL     Status: Normal   Collection Time   02/26/11  1:16 PM      Component Value Range Comment   Neutrophils Relative 71  43 - 77 (%)    Neutro Abs 6.8  1.7 - 7.7 (K/uL)    Lymphocytes Relative 23  12 - 46 (%)    Lymphs Abs 2.2  0.7 - 4.0 (K/uL)    Monocytes Relative 5  3 - 12 (%)    Monocytes Absolute 0.5  0.1 - 1.0 (K/uL)    Eosinophils Relative 1  0 - 5 (%)    Eosinophils Absolute 0.1  0.0 - 0.7 (K/uL)    Basophils Relative 0  0 - 1 (%)    Basophils Absolute 0.0  0.0 - 0.1 (K/uL)   COMPREHENSIVE METABOLIC PANEL     Status: Abnormal   Collection Time   02/26/11  1:16 PM      Component  Value Range Comment   Sodium 135  135 - 145 (mEq/L)    Potassium  5.6 (*) 3.5 - 5.1 (mEq/L)    Chloride 99  96 - 112 (mEq/L)    CO2 22  19 - 32 (mEq/L)    Glucose, Bld 441 (*) 70 - 99 (mg/dL)    BUN 25 (*) 6 - 23 (mg/dL)    Creatinine, Ser 1.61  0.50 - 1.35 (mg/dL)    Calcium 9.3  8.4 - 10.5 (mg/dL)    Total Protein 7.0  6.0 - 8.3 (g/dL)    Albumin 3.4 (*) 3.5 - 5.2 (g/dL)    AST 13  0 - 37 (U/L)    ALT 13  0 - 53 (U/L)    Alkaline Phosphatase 119 (*) 39 - 117 (U/L)    Total Bilirubin 0.2 (*) 0.3 - 1.2 (mg/dL)    GFR calc non Af Amer 48 (*) >90 (mL/min)    GFR calc Af Amer 56 (*) >90 (mL/min)   LIPASE, BLOOD     Status: Normal   Collection Time   02/26/11  1:16 PM      Component Value Range Comment   Lipase 22  11 - 59 (U/L)   OCCULT BLOOD, POC DEVICE     Status: Normal   Collection Time   02/26/11  2:36 PM      Component Value Range Comment   Fecal Occult Bld NEGATIVE     GLUCOSE, CAPILLARY     Status: Abnormal   Collection Time   02/26/11  5:32 PM      Component Value Range Comment   Glucose-Capillary 327 (*) 70 - 99 (mg/dL)   GLUCOSE, CAPILLARY     Status: Abnormal   Collection Time   02/26/11  7:24 PM      Component Value Range Comment   Glucose-Capillary 272 (*) 70 - 99 (mg/dL)   URINALYSIS, ROUTINE W REFLEX MICROSCOPIC     Status: Abnormal   Collection Time   02/26/11  8:16 PM      Component Value Range Comment   Color, Urine YELLOW  YELLOW     APPearance CLEAR  CLEAR     Specific Gravity, Urine 1.026  1.005 - 1.030     pH 5.0  5.0 - 8.0     Glucose, UA >1000 (*) NEGATIVE (mg/dL)    Hgb urine dipstick NEGATIVE  NEGATIVE     Bilirubin Urine SMALL (*) NEGATIVE     Ketones, ur 15 (*) NEGATIVE (mg/dL)    Protein, ur NEGATIVE  NEGATIVE (mg/dL)    Urobilinogen, UA 0.2  0.0 - 1.0 (mg/dL)    Nitrite NEGATIVE  NEGATIVE     Leukocytes, UA NEGATIVE  NEGATIVE    URINE MICROSCOPIC-ADD ON     Status: Normal   Collection Time   02/26/11  8:16 PM      Component Value Range  Comment   WBC, UA 0-2  <3 (WBC/hpf)   CBC     Status: Abnormal   Collection Time   02/26/11  8:21 PM      Component Value Range Comment   WBC 8.5  4.0 - 10.5 (K/uL)    RBC 3.12 (*) 4.22 - 5.81 (MIL/uL)    Hemoglobin 9.5 (*) 13.0 - 17.0 (g/dL)    HCT 09.6 (*) 04.5 - 52.0 (%)    MCV 90.4  78.0 - 100.0 (fL)    MCH 30.4  26.0 - 34.0 (pg)    MCHC 33.7  30.0 - 36.0 (g/dL)    RDW 40.9  81.1 - 91.4 (%)  Platelets 257  150 - 400 (K/uL)   CREATININE, SERUM     Status: Abnormal   Collection Time   02/26/11  8:21 PM      Component Value Range Comment   Creatinine, Ser 1.22  0.50 - 1.35 (mg/dL)    GFR calc non Af Amer 54 (*) >90 (mL/min)    GFR calc Af Amer 63 (*) >90 (mL/min)   HEMOGLOBIN A1C     Status: Abnormal   Collection Time   02/26/11  8:21 PM      Component Value Range Comment   Hemoglobin A1C 9.2 (*) <5.7 (%)    Mean Plasma Glucose 217 (*) <117 (mg/dL)   GLUCOSE, CAPILLARY     Status: Abnormal   Collection Time   02/26/11  8:29 PM      Component Value Range Comment   Glucose-Capillary 208 (*) 70 - 99 (mg/dL)   GLUCOSE, CAPILLARY     Status: Abnormal   Collection Time   02/26/11  9:33 PM      Component Value Range Comment   Glucose-Capillary 122 (*) 70 - 99 (mg/dL)   GLUCOSE, CAPILLARY     Status: Abnormal   Collection Time   02/26/11 10:50 PM      Component Value Range Comment   Glucose-Capillary 215 (*) 70 - 99 (mg/dL)   GLUCOSE, CAPILLARY     Status: Abnormal   Collection Time   02/26/11 11:55 PM      Component Value Range Comment   Glucose-Capillary 218 (*) 70 - 99 (mg/dL)   GLUCOSE, CAPILLARY     Status: Abnormal   Collection Time   02/27/11  1:03 AM      Component Value Range Comment   Glucose-Capillary 197 (*) 70 - 99 (mg/dL)   GLUCOSE, CAPILLARY     Status: Abnormal   Collection Time   02/27/11  2:05 AM      Component Value Range Comment   Glucose-Capillary 167 (*) 70 - 99 (mg/dL)   GLUCOSE, CAPILLARY     Status: Abnormal   Collection Time   02/27/11  3:09 AM        Component Value Range Comment   Glucose-Capillary 152 (*) 70 - 99 (mg/dL)   GLUCOSE, CAPILLARY     Status: Abnormal   Collection Time   02/27/11  4:14 AM      Component Value Range Comment   Glucose-Capillary 114 (*) 70 - 99 (mg/dL)   GLUCOSE, CAPILLARY     Status: Abnormal   Collection Time   02/27/11  5:03 AM      Component Value Range Comment   Glucose-Capillary 102 (*) 70 - 99 (mg/dL)   CBC     Status: Abnormal   Collection Time   02/27/11  6:00 AM      Component Value Range Comment   WBC 9.1  4.0 - 10.5 (K/uL)    RBC 3.07 (*) 4.22 - 5.81 (MIL/uL)    Hemoglobin 9.4 (*) 13.0 - 17.0 (g/dL)    HCT 40.9 (*) 81.1 - 52.0 (%)    MCV 91.5  78.0 - 100.0 (fL)    MCH 30.6  26.0 - 34.0 (pg)    MCHC 33.5  30.0 - 36.0 (g/dL)    RDW 91.4  78.2 - 95.6 (%)    Platelets 246  150 - 400 (K/uL)   BASIC METABOLIC PANEL     Status: Abnormal   Collection Time   02/27/11  6:00 AM  Component Value Range Comment   Sodium 140  135 - 145 (mEq/L)    Potassium 5.0  3.5 - 5.1 (mEq/L)    Chloride 106  96 - 112 (mEq/L)    CO2 26  19 - 32 (mEq/L)    Glucose, Bld 95  70 - 99 (mg/dL)    BUN 20  6 - 23 (mg/dL)    Creatinine, Ser 1.61  0.50 - 1.35 (mg/dL)    Calcium 9.4  8.4 - 10.5 (mg/dL)    GFR calc non Af Amer 52 (*) >90 (mL/min)    GFR calc Af Amer 60 (*) >90 (mL/min)   GLUCOSE, CAPILLARY     Status: Abnormal   Collection Time   02/27/11  8:17 AM      Component Value Range Comment   Glucose-Capillary 235 (*) 70 - 99 (mg/dL)    Comment 1 Notify RN      Comment 2 Documented in Chart      No results found for this or any previous visit (from the past 240 hour(s)).   Hospital Course: 78 yo man with hx of seizures and diabetes admitted for constipation, abdominal pain and uncontrolled DM2. He received enemas, sorbitol, and iv fluids and iv insulin and improved overnight. Plan is to convert his SU to [place] insulin. He will f/u with PCP for insulin adjustments and with [facility] education for  ongoing teaching.  The patient was also referred to [facility] for outpatient colonoscopy.  Discharge Exam: Blood pressure 144/51, pulse 60, temperature 98 F (36.7 C), temperature source Oral, resp. rate 18, height 5\' 11"  (1.803 m), weight 31.979 kg (70 lb 8 oz), SpO2 100.00%. Alert and oriented x3 CVS: RRR RS: CTAB Abdomen: soft, NT   Disposition: home  Discharge Orders    Future Orders Please Complete By Expires   Diet Carb Modified      Increase activity slowly         Follow-up Information    Follow up with [name], MD. (as previously scheduled)    Contact information:   [address] [place] [address] [phone]          Signed: Lonia Blood 02/27/2011, 10:20 AM

## 2011-10-27 ENCOUNTER — Other Ambulatory Visit: Payer: Self-pay | Admitting: Internal Medicine

## 2011-11-20 IMAGING — CT CT HEAD W/O CM
1 of 2 series · 16 of 30 positions shown, 20 images · non-contrast
Comparison: 06/12/2008

CLINICAL DATA: Multiple seizures.

CT HEAD WITHOUT CONTRAST
TECHNIQUE: Contiguous axial images were obtained from the base of
the skull through the vertex without contrast.

[Series 3: recon 2: brain · axial · 0.47mm/px · z∈[+126,+275]mm · 16 of 96 slices shown, 20 images]
[im 6/96  brain]
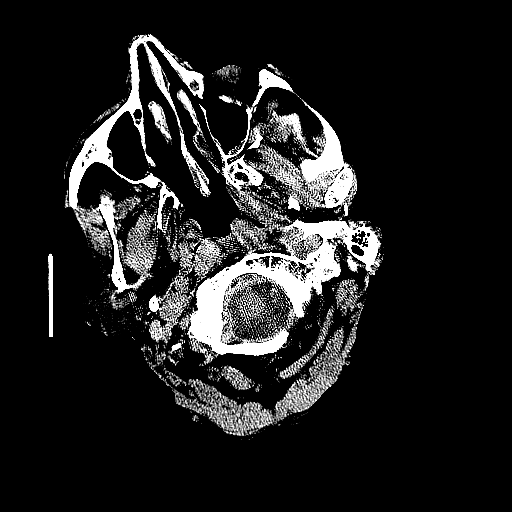
[im 6/96  bone]
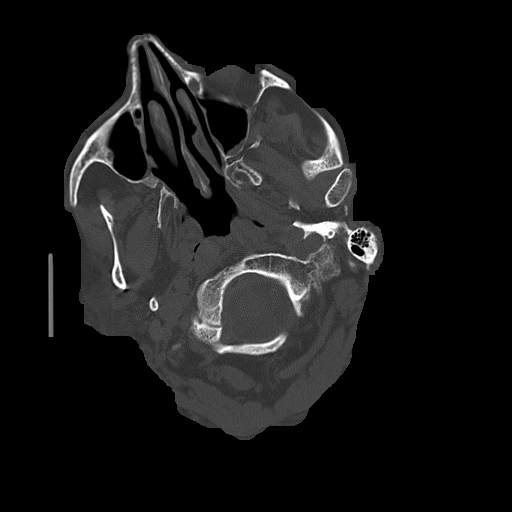
[im 11/96  brain]
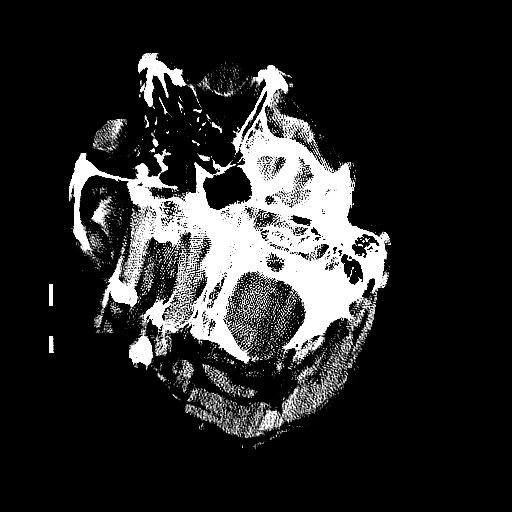
[im 16/96  brain]
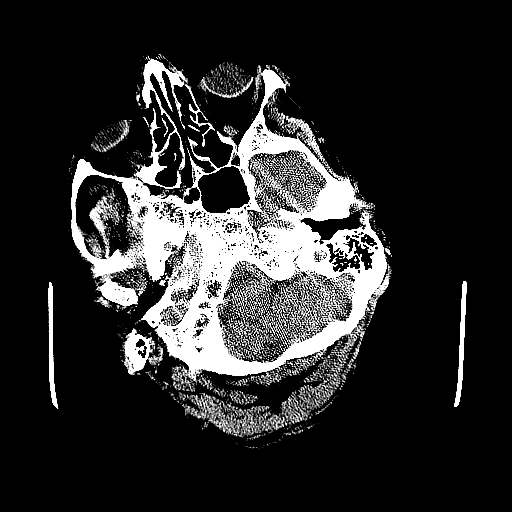
[im 21/96  brain]
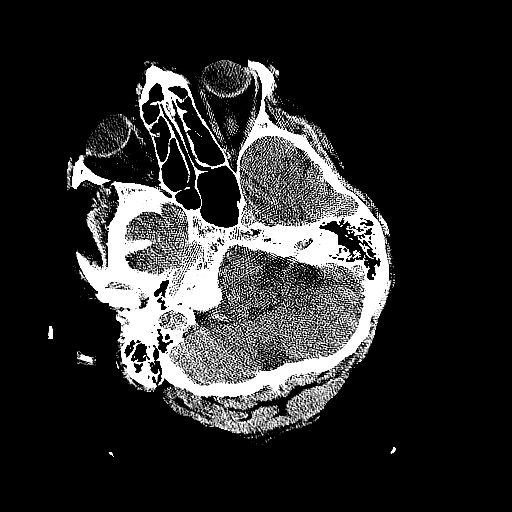
[im 31/96  brain]
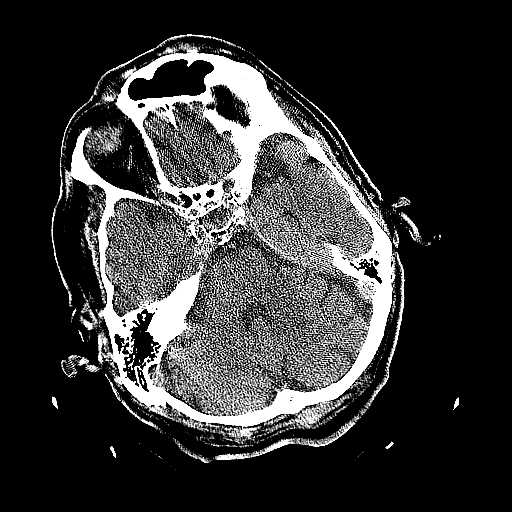
[im 31/96  bone]
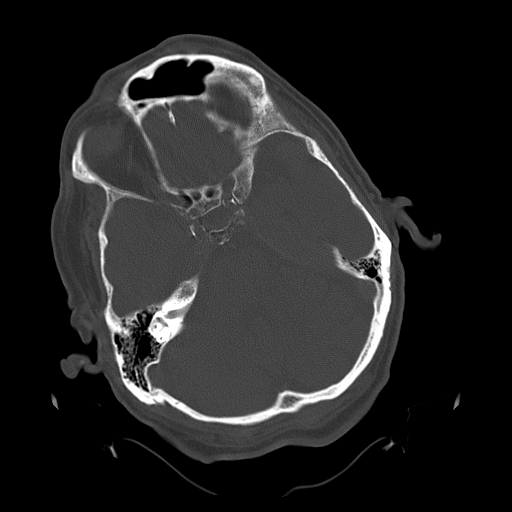
[im 36/96  brain]
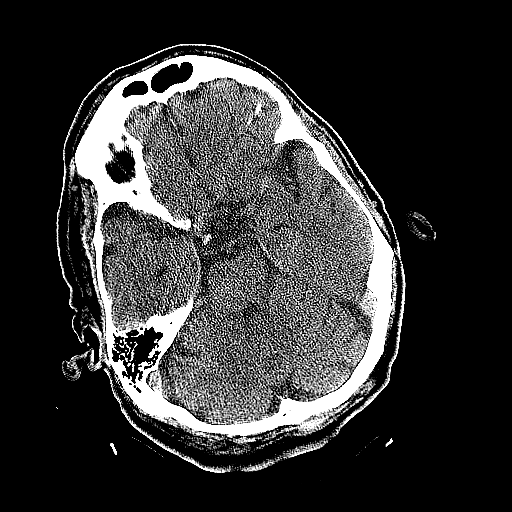
[im 41/96  brain]
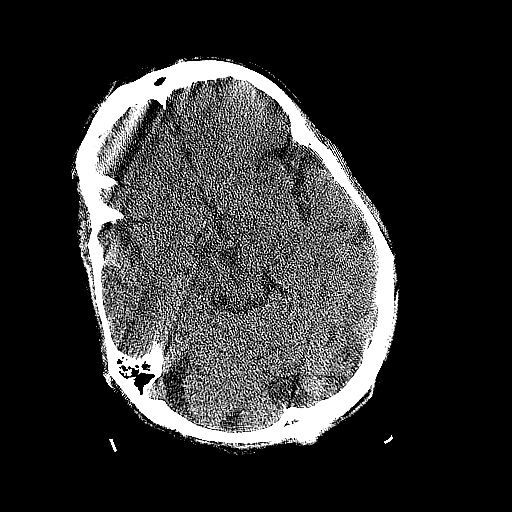
[im 46/96  brain]
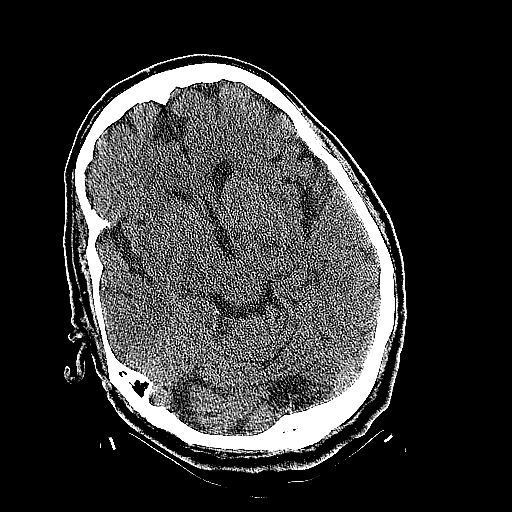
[im 51/96  brain]
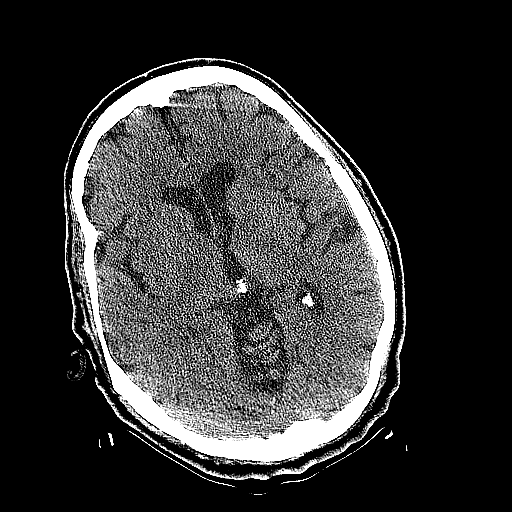
[im 51/96  bone]
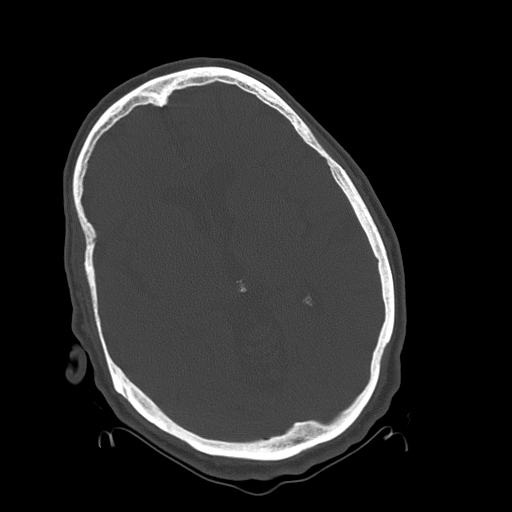
[im 56/96  brain]
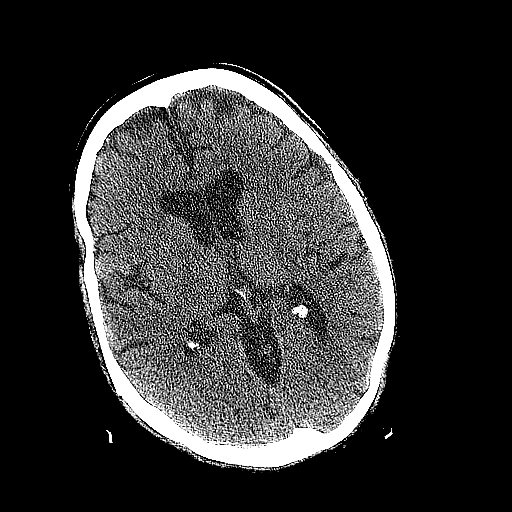
[im 61/96  brain]
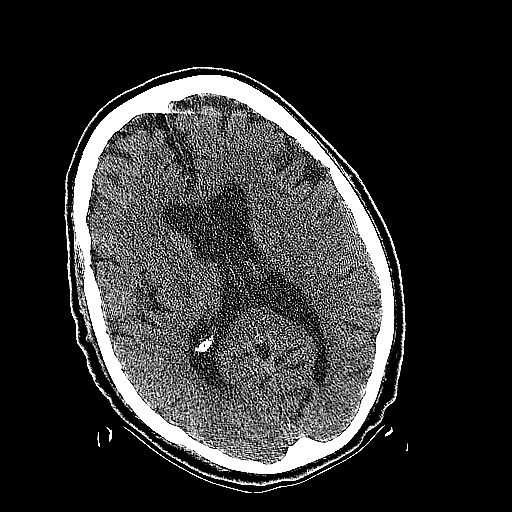
[im 66/96  brain]
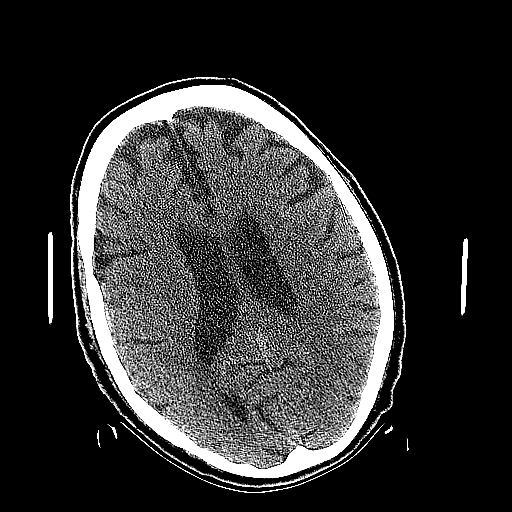
[im 76/96  brain]
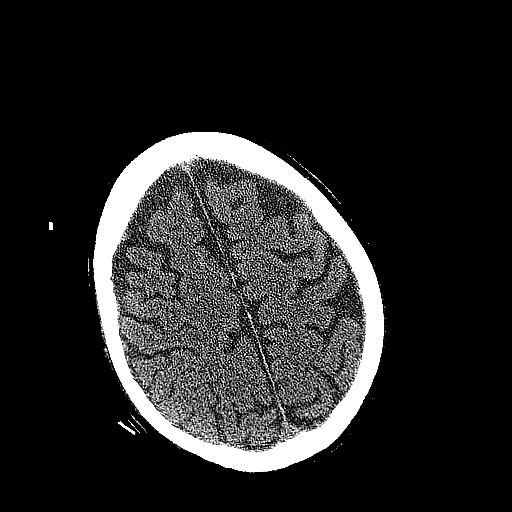
[im 76/96  bone]
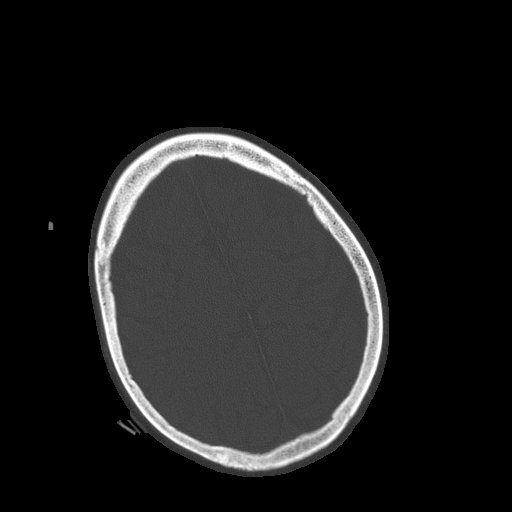
[im 81/96  brain]
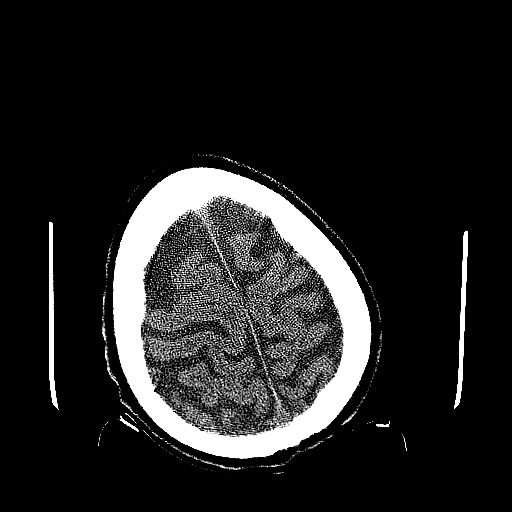
[im 86/96  brain]
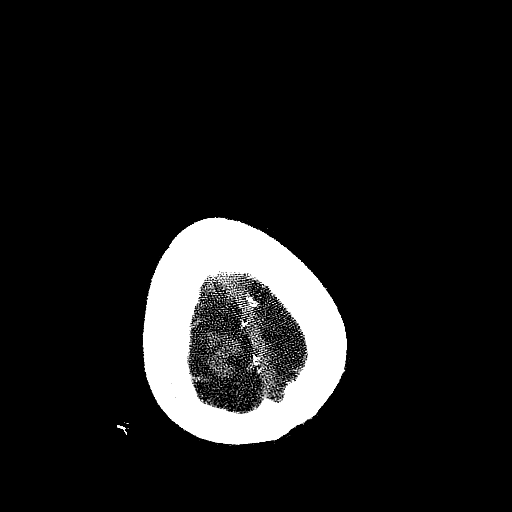
[im 91/96  brain]
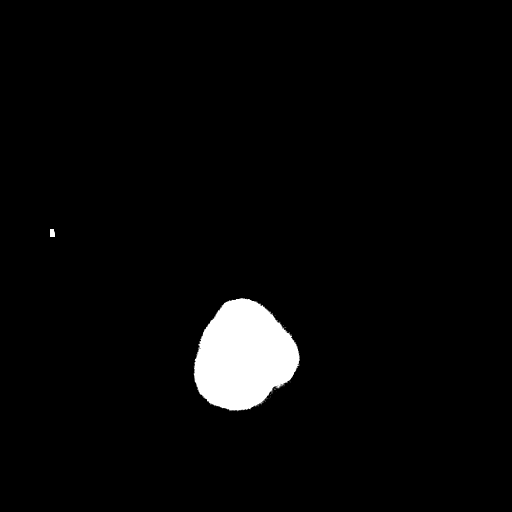

[16 of 30 positions shown; findings below may reference images not displayed]

FINDINGS: A small left anterior thalamic remote lacunar infarct
noted.  Otherwise, the brain stem, cerebellum, cerebral peduncles,
thalami, basal ganglia, basilar cisterns, and ventricular system
appear unremarkable.

No intracranial hemorrhage, mass lesion, or acute infarction is
identified.

Mild chronic left maxillary sinusitis noted.
IMPRESSION: 1.  Stable small left anterior thalamic lacune.
2.  Mild chronic left maxillary sinusitis.
3.   Otherwise, no significant abnormality identified.

## 2012-05-18 ENCOUNTER — Emergency Department (HOSPITAL_COMMUNITY): Payer: Medicare Other

## 2012-05-18 ENCOUNTER — Inpatient Hospital Stay (HOSPITAL_COMMUNITY)
Admission: EM | Admit: 2012-05-18 | Discharge: 2012-05-19 | DRG: 069 | Disposition: A | Discharge status: Home / Self Care | Payer: Medicare Other | Attending: Internal Medicine | Admitting: Internal Medicine

## 2012-05-18 ENCOUNTER — Encounter (HOSPITAL_COMMUNITY): Payer: Self-pay | Admitting: *Deleted

## 2012-05-18 DIAGNOSIS — R209 Unspecified disturbances of skin sensation: Secondary | ICD-10-CM | POA: Diagnosis present

## 2012-05-18 DIAGNOSIS — E1100 Type 2 diabetes mellitus with hyperosmolarity without nonketotic hyperglycemic-hyperosmolar coma (NKHHC): Secondary | ICD-10-CM | POA: Diagnosis present

## 2012-05-18 DIAGNOSIS — F411 Generalized anxiety disorder: Secondary | ICD-10-CM | POA: Diagnosis present

## 2012-05-18 DIAGNOSIS — Z951 Presence of aortocoronary bypass graft: Secondary | ICD-10-CM

## 2012-05-18 DIAGNOSIS — E785 Hyperlipidemia, unspecified: Secondary | ICD-10-CM | POA: Diagnosis present

## 2012-05-18 DIAGNOSIS — N189 Chronic kidney disease, unspecified: Secondary | ICD-10-CM | POA: Diagnosis present

## 2012-05-18 DIAGNOSIS — R569 Unspecified convulsions: Secondary | ICD-10-CM | POA: Diagnosis present

## 2012-05-18 DIAGNOSIS — I2510 Atherosclerotic heart disease of native coronary artery without angina pectoris: Secondary | ICD-10-CM

## 2012-05-18 DIAGNOSIS — I252 Old myocardial infarction: Secondary | ICD-10-CM

## 2012-05-18 DIAGNOSIS — G459 Transient cerebral ischemic attack, unspecified: Principal | ICD-10-CM | POA: Diagnosis present

## 2012-05-18 DIAGNOSIS — I25810 Atherosclerosis of coronary artery bypass graft(s) without angina pectoris: Secondary | ICD-10-CM | POA: Diagnosis present

## 2012-05-18 DIAGNOSIS — E119 Type 2 diabetes mellitus without complications: Secondary | ICD-10-CM | POA: Diagnosis present

## 2012-05-18 DIAGNOSIS — I129 Hypertensive chronic kidney disease with stage 1 through stage 4 chronic kidney disease, or unspecified chronic kidney disease: Secondary | ICD-10-CM | POA: Diagnosis present

## 2012-05-18 DIAGNOSIS — G40909 Epilepsy, unspecified, not intractable, without status epilepticus: Secondary | ICD-10-CM | POA: Diagnosis present

## 2012-05-18 DIAGNOSIS — H532 Diplopia: Secondary | ICD-10-CM | POA: Diagnosis present

## 2012-05-18 DIAGNOSIS — K219 Gastro-esophageal reflux disease without esophagitis: Secondary | ICD-10-CM | POA: Diagnosis present

## 2012-05-18 DIAGNOSIS — R279 Unspecified lack of coordination: Secondary | ICD-10-CM | POA: Diagnosis present

## 2012-05-18 DIAGNOSIS — R259 Unspecified abnormal involuntary movements: Secondary | ICD-10-CM | POA: Diagnosis present

## 2012-05-18 DIAGNOSIS — I10 Essential (primary) hypertension: Secondary | ICD-10-CM | POA: Diagnosis present

## 2012-05-18 DIAGNOSIS — R269 Unspecified abnormalities of gait and mobility: Secondary | ICD-10-CM

## 2012-05-18 LAB — BASIC METABOLIC PANEL
CO2: 24 mEq/L (ref 19–32)
Chloride: 103 mEq/L (ref 96–112)
GFR calc non Af Amer: 48 mL/min — ABNORMAL LOW (ref >90)
Glucose, Bld: 147 mg/dL — ABNORMAL HIGH (ref 70–99)
Potassium: 5.1 mEq/L (ref 3.5–5.1)
Sodium: 137 mEq/L (ref 135–145)

## 2012-05-18 LAB — CBC WITH DIFFERENTIAL/PLATELET
Eosinophils Absolute: 0.5 10*3/uL (ref 0.0–0.7)
Hemoglobin: 12.8 g/dL — ABNORMAL LOW (ref 13.0–17.0)
Lymphocytes Relative: 36 % (ref 12–46)
Lymphs Abs: 3.2 10*3/uL (ref 0.7–4.0)
Monocytes Relative: 8 % (ref 3–12)
Neutro Abs: 4.5 10*3/uL (ref 1.7–7.7)
Neutrophils Relative %: 50 % (ref 43–77)
Platelets: 215 10*3/uL (ref 150–400)
RBC: 4 MIL/uL — ABNORMAL LOW (ref 4.22–5.81)
WBC: 9 10*3/uL (ref 4.0–10.5)

## 2012-05-18 LAB — CARBAMAZEPINE LEVEL, TOTAL: Carbamazepine Lvl: 7.1 ug/mL (ref 4.0–12.0)

## 2012-05-18 MED ORDER — SODIUM CHLORIDE 0.9 % IV SOLN
INTRAVENOUS | Status: DC
  Administered 2012-05-19: 01:00:00 via INTRAVENOUS

## 2012-05-18 MED ORDER — TAMSULOSIN HCL 0.4 MG PO CAPS
0.4000 mg | ORAL_CAPSULE | Freq: Every day | ORAL | Status: DC
Start: 2012-05-19 — End: 2012-05-19
  Administered 2012-05-19: 0.4 mg via ORAL
  Filled 2012-05-18: qty 1

## 2012-05-18 MED ORDER — CARVEDILOL 25 MG PO TABS
25.0000 mg | ORAL_TABLET | Freq: Two times a day (BID) | ORAL | Status: DC
  Administered 2012-05-19 (×2): 25 mg via ORAL
  Filled 2012-05-18 (×4): qty 1

## 2012-05-18 MED ORDER — AMLODIPINE BESYLATE 10 MG PO TABS
10.0000 mg | ORAL_TABLET | Freq: Every day | ORAL | Status: DC
  Administered 2012-05-19: 10 mg via ORAL
  Filled 2012-05-18: qty 1

## 2012-05-18 MED ORDER — INSULIN ASPART 100 UNIT/ML ~~LOC~~ SOLN
0.0000 [IU] | Freq: Three times a day (TID) | SUBCUTANEOUS | Status: DC
  Administered 2012-05-19 (×2): 2 [IU] via SUBCUTANEOUS

## 2012-05-18 MED ORDER — INSULIN GLARGINE 100 UNIT/ML ~~LOC~~ SOLN
25.0000 [IU] | Freq: Every day | SUBCUTANEOUS | Status: DC
  Filled 2012-05-18 (×2): qty 0.25

## 2012-05-18 MED ORDER — PANTOPRAZOLE SODIUM 40 MG PO TBEC
40.0000 mg | DELAYED_RELEASE_TABLET | Freq: Every day | ORAL | Status: DC
  Administered 2012-05-19: 40 mg via ORAL
  Filled 2012-05-18: qty 1

## 2012-05-18 MED ORDER — MIRTAZAPINE 15 MG PO TBDP
15.0000 mg | ORAL_TABLET | Freq: Every day | ORAL | Status: DC
  Filled 2012-05-18 (×2): qty 1

## 2012-05-18 MED ORDER — ENOXAPARIN SODIUM 40 MG/0.4ML ~~LOC~~ SOLN
40.0000 mg | Freq: Every day | SUBCUTANEOUS | Status: DC
  Administered 2012-05-19: 40 mg via SUBCUTANEOUS
  Filled 2012-05-18: qty 0.4

## 2012-05-18 MED ORDER — SIMVASTATIN 40 MG PO TABS
40.0000 mg | ORAL_TABLET | Freq: Every day | ORAL | Status: DC
  Filled 2012-05-18 (×2): qty 1

## 2012-05-18 MED ORDER — GLYBURIDE 5 MG PO TABS
5.0000 mg | ORAL_TABLET | Freq: Every day | ORAL | Status: DC
  Administered 2012-05-19: 5 mg via ORAL
  Filled 2012-05-18 (×3): qty 1

## 2012-05-18 MED ORDER — LORAZEPAM 0.5 MG PO TABS: 0.5000 mg | ORAL_TABLET | Freq: Two times a day (BID) | ORAL | Status: DC | PRN

## 2012-05-18 MED ORDER — PHENYTOIN SODIUM EXTENDED 30 MG PO CAPS
30.0000 mg | ORAL_CAPSULE | Freq: Every evening | ORAL | Status: DC
  Administered 2012-05-19: 30 mg via ORAL
  Filled 2012-05-18: qty 1

## 2012-05-18 MED ORDER — VITAMIN B-12 1000 MCG PO TABS
1000.0000 ug | ORAL_TABLET | Freq: Every day | ORAL | Status: DC
  Administered 2012-05-19: 1000 ug via ORAL
  Filled 2012-05-18: qty 1

## 2012-05-18 NOTE — H&P (Signed)
[facility] History and Physical  [name] JXB:[id number] DOB: [birth year] DOA: 05/18/2012  Referring physician: ER physician. PCP: [name], MD  Specialists: [facility] neurology.  Chief Complaint: Difficulty balancing.  HPI: [name] is a 79 y.o. male with history of seizures, CAD status post CABG, diabetes mellitus type 2, hypertension started experiencing imbalance since 9:30 AM and was brought to the ER due to persistent symptoms. Patient states that he accidentally took his evening medications which includes carbamazepine in the morning around 8:30 AM and around 9:30 AM he started experiencing imbalance. He was not able to walk due to imbalance and also started experiencing some diplopia and right lower extremity numbness. In the ER CT head was negative and at this time carbamazepine level and Dilantin levels are pending. MRI/MRA of the brain was done and results are pending. Patient otherwise denies any chest pain shortness of breath nausea vomiting diarrhea.  Review of Systems: As presented in the history of presenting illness, rest negative.  Past Medical History  Diagnosis Date  . CAD (coronary artery disease)     s/p cabg 3/10...s/p MI age 33  . Seizure disorder   . Diabetes mellitus type II   . HTN (hypertension)   . HLD (hyperlipidemia)   . Anxiety disorder   . Tremor   . GERD (gastroesophageal reflux disease)   . Pulmonary vein stenosis   . Seizures   . Angina   . Chronic kidney disease   . Anxiety   . Myocardial infarction    Past Surgical History  Procedure Laterality Date  . Coronary artery bypass graft  3/10  . No past surgeries     Social History:  reports that he has never smoked. He has quit using smokeless tobacco. He reports that  drinks alcohol. His drug history is not on file. Lives at home. where does patient live-- Can do ADLs. Can patient participate in ADLs?  No Known Allergies  Family History  Problem Relation Age  of Onset  . Coronary artery disease    . Hypertension        Prior to Admission medications   Medication Sig Start Date End Date Taking? Authorizing Provider  amLODipine (NORVASC) 10 MG tablet Take 10 mg by mouth daily.    Yes Historical Provider, MD  aspirin 81 MG chewable tablet Chew 81 mg by mouth daily.   Yes Historical Provider, MD  carbamazepine (TEGRETOL) 200 MG tablet Take 400 mg by mouth at bedtime.    Yes Historical Provider, MD  carvedilol (COREG) 25 MG tablet Take 25 mg by mouth 2 (two) times daily with a meal.    Yes Historical Provider, MD  glyBURIDE (DIABETA) 5 MG tablet Take 5 mg by mouth daily with breakfast.   Yes Historical Provider, MD  insulin glargine (LANTUS) 100 UNIT/ML injection Inject 25 Units into the skin at bedtime.    Yes Historical Provider, MD  LORazepam (ATIVAN) 1 MG tablet Take 0.5-1 mg by mouth 2 (two) times daily as needed. For anxiety.   Yes Historical Provider, MD  mirtazapine (REMERON SOL-TAB) 15 MG disintegrating tablet Take 15 mg by mouth at bedtime.   Yes Historical Provider, MD  pantoprazole (PROTONIX) 40 MG tablet Take 40 mg by mouth daily.    Yes Historical Provider, MD  phenytoin (DILANTIN) 100 MG ER capsule Take 300 mg by mouth at bedtime.   Yes Historical Provider, MD  phenytoin (DILANTIN) 30 MG ER capsule Take 30 mg by mouth every evening.  Yes Historical Provider, MD  simvastatin (ZOCOR) 40 MG tablet Take 40 mg by mouth at bedtime.    Yes Historical Provider, MD  Tamsulosin HCl (FLOMAX) 0.4 MG CAPS Take 0.4 mg by mouth daily.   Yes Historical Provider, MD  vitamin B-12 (CYANOCOBALAMIN) 1000 MCG tablet Take 1,000 mcg by mouth daily.    Yes Historical Provider, MD   Physical Exam: Filed Vitals:   05/18/12 1650 05/18/12 2014 05/18/12 2233 05/18/12 2236  BP: 181/73  142/78   Pulse: 68 73 72   Temp: 97.9 F (36.6 C) 98 F (36.7 C) 97.5 F (36.4 C) 97.5 F (36.4 C)  TempSrc: Oral Oral Oral   Resp: 18 19 16    SpO2:  100% 100%       General:  Well-developed and nourished.  Eyes: No nystagmus. Perla positive.  ENT: No discharge from ears eyes nose and mouth.  Neck: No mass felt.  Cardiovascular: S1-S2 heard.  Respiratory: No rhonchi or crepitations.  Abdomen: Soft nontender bowel sounds present.  Skin: No rash.  Musculoskeletal: No edema.  Psychiatric: Appears normal.  Neurologic: Alert awake oriented to time place and person. No facial asymmetry. Moves all extremities 5 x 5. Patient unable to walk due to imbalance.  Labs on Admission:  Basic Metabolic Panel:  Recent Labs Lab 05/18/12 1931  NA 137  K 5.1  CL 103  CO2 24  GLUCOSE 147*  BUN 22  CREATININE 1.35  CALCIUM 9.4   Liver Function Tests: No results found for this basename: AST, ALT, ALKPHOS, BILITOT, PROT, ALBUMIN,  in the last 168 hours No results found for this basename: LIPASE, AMYLASE,  in the last 168 hours No results found for this basename: AMMONIA,  in the last 168 hours CBC:  Recent Labs Lab 05/18/12 1931  WBC 9.0  NEUTROABS 4.5  HGB 12.8*  HCT 35.4*  MCV 88.5  PLT 215   Cardiac Enzymes: No results found for this basename: CKTOTAL, CKMB, CKMBINDEX, TROPONINI,  in the last 168 hours  BNP (last 3 results) No results found for this basename: PROBNP,  in the last 8760 hours CBG: No results found for this basename: GLUCAP,  in the last 168 hours  Radiological Exams on Admission: Ct Head Wo Contrast  05/18/2012  *RADIOLOGY REPORT*  Clinical Data: Right leg weakness.  Gait disturbance  CT HEAD WITHOUT CONTRAST  Technique:  Contiguous axial images were obtained from the base of the skull through the vertex without contrast.  Comparison: CT 02/09/2011  Findings: Generalized atrophy.  Negative for hydrocephalus.  Small chronic infarct left anterior thalamus.  This is unchanged.  No acute infarct.  Negative for hemorrhage or mass.  Calvarium intact.  IMPRESSION: No acute abnormality.   Original Report Authenticated By:  Janeece Riggers, M.D.      Assessment/Plan Active Problems:   HYPERLIPIDEMIA-MIXED   HYPERTENSION, BENIGN   CAD, AUTOLOGOUS BYPASS GRAFT   SEIZURE DISORDER   ATAXIA   Type 2 diabetes mellitus with hyperosmolar nonketotic hyperglycemia   1. Ataxia - I have discussed the MRI with the on-call urologist Dr. Amada Jupiter. As per Dr. Amada Jupiter there is no acute CVA in the MRI. At this time carbamazepine levels and Dilantin levels are pending. Until then I'm holding these medications. If levels are normal then further stroke workup including carotid Doppler 2-D echo has to be ordered. At that time may consult neurologist. 2. Seizure disorder - if carbamazepine and and levels are normal please restart them. Patient's last seizure was more  than 3 years ago. 3. CAD status post CABG - denies any chest pain. 4. Diabetes mellitus type 2 - continue home medications with sliding-scale coverage. 5. Hyperlipidemia - continue home medications. 6. Hypertension - continue home medications.    Code Status: Full code.  Family Communication: None.  Disposition Plan: Admit to inpatient.    KAKRAKANDY,ARSHAD N. [facility] Pager [phone].  If 7PM-7AM, please contact night-coverage [email or web address] Password [email or web address] 05/18/2012, 10:46 PM

## 2012-05-18 NOTE — ED Provider Notes (Signed)
History     CSN: [id number]  Arrival date & time 05/18/12  1639   First MD Initiated Contact with Patient 05/18/12 1904      Chief Complaint  Patient presents with  . Blurred Vision    (Consider location/radiation/quality/duration/timing/severity/associated sxs/prior treatment) Patient is a 79 y.o. male presenting with neurologic complaint.  Neurologic Problem Primary symptoms do not include fever, nausea or vomiting. Primary symptoms comment: blurry vision, unable to walk. Episode onset: 11 hours ago at approx 08:30. The symptoms are unchanged. Context: pt took his PM medications this AM.  Additional symptoms include loss of balance. Additional symptoms do not include vertigo.    Past Medical History  Diagnosis Date  . CAD (coronary artery disease)     s/p cabg 3/10...s/p MI age 74  . Seizure disorder   . Diabetes mellitus type II   . HTN (hypertension)   . HLD (hyperlipidemia)   . Anxiety disorder   . Tremor   . GERD (gastroesophageal reflux disease)   . Pulmonary vein stenosis   . Seizures   . Angina   . Chronic kidney disease   . Anxiety   . Myocardial infarction     Past Surgical History  Procedure Laterality Date  . Coronary artery bypass graft  3/10  . No past surgeries      Family History  Problem Relation Age of Onset  . Coronary artery disease    . Hypertension      History  Substance Use Topics  . Smoking status: Never Smoker   . Smokeless tobacco: Former Neurosurgeon  . Alcohol Use: 0.0 oz/week     Comment: remote alcohol history but has not drank any for many years.      Review of Systems  Constitutional: Negative for fever.  HENT: Negative for congestion and facial swelling.   Respiratory: Negative for cough and shortness of breath.   Cardiovascular: Negative for chest pain.  Gastrointestinal: Negative for nausea, vomiting, abdominal pain and diarrhea.  Genitourinary: Negative for difficulty urinating.  Neurological: Positive for loss of  balance. Negative for vertigo.  All other systems reviewed and are negative.    Allergies  Review of patient's allergies indicates no known allergies.  Home Medications   Current Outpatient Rx  Name  Route  Sig  Dispense  Refill  . amLODipine (NORVASC) 10 MG tablet   Oral   Take 10 mg by mouth daily.          Marland Kitchen aspirin 81 MG chewable tablet   Oral   Chew 81 mg by mouth daily.         . carbamazepine (TEGRETOL) 200 MG tablet   Oral   Take 400 mg by mouth at bedtime.          . carvedilol (COREG) 25 MG tablet   Oral   Take 25 mg by mouth 2 (two) times daily with a meal.          . glyBURIDE (DIABETA) 5 MG tablet   Oral   Take 5 mg by mouth daily with breakfast.         . insulin glargine (LANTUS) 100 UNIT/ML injection   Subcutaneous   Inject 25 Units into the skin at bedtime.          Marland Kitchen LORazepam (ATIVAN) 1 MG tablet   Oral   Take 0.5-1 mg by mouth 2 (two) times daily as needed. For anxiety.         . mirtazapine (REMERON SOL-TAB)  15 MG disintegrating tablet   Oral   Take 15 mg by mouth at bedtime.         . pantoprazole (PROTONIX) 40 MG tablet   Oral   Take 40 mg by mouth daily.          . phenytoin (DILANTIN) 100 MG ER capsule   Oral   Take 300 mg by mouth at bedtime.         . phenytoin (DILANTIN) 30 MG ER capsule   Oral   Take 30 mg by mouth every evening.          . simvastatin (ZOCOR) 40 MG tablet   Oral   Take 40 mg by mouth at bedtime.          . Tamsulosin HCl (FLOMAX) 0.4 MG CAPS   Oral   Take 0.4 mg by mouth daily.         . vitamin B-12 (CYANOCOBALAMIN) 1000 MCG tablet   Oral   Take 1,000 mcg by mouth daily.            BP 181/73  Pulse 68  Temp(Src) 97.9 F (36.6 C) (Oral)  Resp 18  Physical Exam  Nursing note and vitals reviewed. Constitutional: He is oriented to person, place, and time. He appears well-developed and well-nourished. No distress.  HENT:  Head: Normocephalic and atraumatic.   Mouth/Throat: Oropharynx is clear and moist.  Eyes: Conjunctivae are normal. Pupils are equal, round, and reactive to light. No scleral icterus.  Neck: Normal range of motion. Neck supple.  Cardiovascular: Normal rate, regular rhythm, normal heart sounds and intact distal pulses.   No murmur heard. Pulmonary/Chest: Effort normal and breath sounds normal. No stridor. No respiratory distress. He has no wheezes. He has no rales.  Abdominal: Soft. He exhibits no distension. There is no tenderness.  Musculoskeletal: Normal range of motion. He exhibits no edema.  Neurological: He is alert and oriented to person, place, and time. No cranial nerve deficit or sensory deficit. Gait abnormal. Coordination normal. GCS eye subscore is 4. GCS verbal subscore is 5. GCS motor subscore is 6.  Decreased strength in RUE extension.  Otherwise strength grossly preserved  Skin: Skin is warm and dry. No rash noted.  Psychiatric: He has a normal mood and affect. His behavior is normal.    ED Course  Procedures (including critical care time)  Labs Reviewed  CBC WITH DIFFERENTIAL - Abnormal; Notable for the following:    RBC 4.00 (*)    Hemoglobin 12.8 (*)    HCT 35.4 (*)    MCHC 36.2 (*)    Eosinophils Relative 6 (*)    All other components within normal limits  BASIC METABOLIC PANEL - Abnormal; Notable for the following:    Glucose, Bld 147 (*)    GFR calc non Af Amer 48 (*)    GFR calc Af Amer 55 (*)    All other components within normal limits  CARBAMAZEPINE LEVEL, TOTAL  PHENYTOIN LEVEL, FREE  COMPREHENSIVE METABOLIC PANEL  CBC WITH DIFFERENTIAL  URINE RAPID DRUG SCREEN (HOSP PERFORMED)  HEMOGLOBIN A1C  LIPID PANEL   Ct Head Wo Contrast  05/18/2012  *RADIOLOGY REPORT*  Clinical Data: Right leg weakness.  Gait disturbance  CT HEAD WITHOUT CONTRAST  Technique:  Contiguous axial images were obtained from the base of the skull through the vertex without contrast.  Comparison: CT 02/09/2011  Findings:  Generalized atrophy.  Negative for hydrocephalus.  Small chronic infarct left anterior thalamus.  This is unchanged.  No acute infarct.  Negative for hemorrhage or mass.  Calvarium intact.  IMPRESSION: No acute abnormality.   Original Report Authenticated By: Janeece Riggers, M.D.    Mr Angiogram Head Wo Contrast  05/18/2012  *RADIOLOGY REPORT*  Clinical Data:  Blurred vision and unsteady gait.  Slurred speech. Draining the right leg.  MRI HEAD WITHOUT CONTRAST MRA HEAD WITHOUT CONTRAST  Technique:  Multiplanar, multiecho pulse sequences of the brain and surrounding structures were obtained without intravenous contrast. Angiographic images of the head were obtained using MRA technique without contrast.  Comparison:  CT head without contrast 05/18/2012.  MRI brain 09/21/2010.  MRI HEAD  Findings:  The acute infarct, hemorrhage, or mass lesion is present.  Mild generalized atrophy is within normal limits for age. The ventricles are of normal size. A remote lacunar infarct of the left thalamus is stable.  No significant extra-axial fluid collection is present.  Flow is present in the major intracranial arteries.  The patient is status post bilateral lens extractions. Mild mucosal thickening is present within the maxillary sinuses and ethmoid air cells.  There is minimal fluid in the mastoid air cells bilaterally.  No obstructing nasopharyngeal lesions are present.  IMPRESSION:  1.  Normal MRI brain for age. 2.  Minimal sinus disease.  MRA HEAD  Findings: Mild atherosclerotic irregularity is present within the cavernous carotid arteries bilaterally.  The internal carotid arteries are otherwise within normal limits.  The A1 and M1 segments are normal.  No definite anterior communicating artery is present.  The MCA bifurcations are within normal limits bilaterally.  The ACA and MCA branch vessels are within normal limits for age.  The left vertebral artery is the dominant vessel.  There is a stable high-grade stenosis of  the right vertebral artery just beyond the PICA takeoff.  The posterior cerebral arteries are of fetal type with the basilar artery terminating at the superior cerebellar arteries.  Segmental irregularity is present within the PCA branch vessels bilaterally.  IMPRESSION:  1.  Mild small vessel disease is most evident in the posterior circulation and not significantly changed. 2.  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 3.  Stable mild atherosclerotic changes within the cavernous carotid arteries bilaterally. 4.  No acute or interval change.   Original Report Authenticated By: Marin Roberts, M.D.    Mr Brain Wo Contrast  05/18/2012  *RADIOLOGY REPORT*  Clinical Data:  Blurred vision and unsteady gait.  Slurred speech. Draining the right leg.  MRI HEAD WITHOUT CONTRAST MRA HEAD WITHOUT CONTRAST  Technique:  Multiplanar, multiecho pulse sequences of the brain and surrounding structures were obtained without intravenous contrast. Angiographic images of the head were obtained using MRA technique without contrast.  Comparison:  CT head without contrast 05/18/2012.  MRI brain 09/21/2010.  MRI HEAD  Findings:  The acute infarct, hemorrhage, or mass lesion is present.  Mild generalized atrophy is within normal limits for age. The ventricles are of normal size. A remote lacunar infarct of the left thalamus is stable.  No significant extra-axial fluid collection is present.  Flow is present in the major intracranial arteries.  The patient is status post bilateral lens extractions. Mild mucosal thickening is present within the maxillary sinuses and ethmoid air cells.  There is minimal fluid in the mastoid air cells bilaterally.  No obstructing nasopharyngeal lesions are present.  IMPRESSION:  1.  Normal MRI brain for age. 2.  Minimal sinus disease.  MRA HEAD  Findings: Mild atherosclerotic irregularity is present within the cavernous carotid arteries  bilaterally.  The internal carotid arteries are otherwise within normal limits.  The A1 and M1 segments are normal.  No definite anterior communicating artery is present.  The MCA bifurcations are within normal limits bilaterally.  The ACA and MCA branch vessels are within normal limits for age.  The left vertebral artery is the dominant vessel.  There is a stable high-grade stenosis of the right vertebral artery just beyond the PICA takeoff.  The posterior cerebral arteries are of fetal type with the basilar artery terminating at the superior cerebellar arteries.  Segmental irregularity is present within the PCA branch vessels bilaterally.  IMPRESSION:  1.  Mild small vessel disease is most evident in the posterior circulation and not significantly changed. 2.  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 3.  Stable mild atherosclerotic changes within the cavernous carotid arteries bilaterally. 4.  No acute or interval change.   Original Report Authenticated By: Marin Roberts, M.D.   All radiology studies independently viewed by me.      1. Gait abnormality    MDM   79 yo male with double vision and a gait disturbance.  He is unable to ambulate at time of my exam.  Code stroke not called and tPA not given because of time since onset of symptoms.  He denies a room spinning sensation.  Neurologic exam otherwise notable for blurry vision (EOM are intact and visual fields are intact) and right arm weakness (which pt describes as chronic).  Plan CT head and then will need stroke workup.  Confounding his presentation is the fact that he states he took his PM medications this morning at 08:30.  He think his symptoms might be related to that.  However, his symptoms have not improved 11 hours later.    CT negative.  Ordered MRI and consulted internal medicine for admission for further workup.         Rennis Petty, MD 05/19/12 [phone]

## 2012-05-18 NOTE — ED Notes (Signed)
PT took his pm meds at 0830 in the place of his am meds.  He called his daughter and she stated he had slurred speech and he is dragging his R leg more than he normally does.  Pt AO X 4.  No neuro deficits noted.  Pt states he feels the side-effects are wearing out.  PM meds include simvastatin, phenytoin ex (300 mg), epitol (400 mg), dilantin (30 mg), carvedilol 25 mg.

## 2012-05-19 DIAGNOSIS — E119 Type 2 diabetes mellitus without complications: Secondary | ICD-10-CM

## 2012-05-19 DIAGNOSIS — R269 Unspecified abnormalities of gait and mobility: Secondary | ICD-10-CM

## 2012-05-19 DIAGNOSIS — E1101 Type 2 diabetes mellitus with hyperosmolarity with coma: Secondary | ICD-10-CM

## 2012-05-19 DIAGNOSIS — I519 Heart disease, unspecified: Secondary | ICD-10-CM

## 2012-05-19 LAB — CBC WITH DIFFERENTIAL/PLATELET
Basophils Relative: 0 % (ref 0–1)
Eosinophils Relative: 6 % — ABNORMAL HIGH (ref 0–5)
HCT: 33.5 % — ABNORMAL LOW (ref 39.0–52.0)
Hemoglobin: 11.6 g/dL — ABNORMAL LOW (ref 13.0–17.0)
MCH: 30.9 pg (ref 26.0–34.0)
MCHC: 34.6 g/dL (ref 30.0–36.0)
MCV: 89.1 fL (ref 78.0–100.0)
Monocytes Absolute: 0.6 10*3/uL (ref 0.1–1.0)
Monocytes Relative: 7 % (ref 3–12)
Neutro Abs: 3.7 10*3/uL (ref 1.7–7.7)
RDW: 12.7 % (ref 11.5–15.5)

## 2012-05-19 LAB — LIPID PANEL
Cholesterol: 157 mg/dL (ref 0–200)
LDL Cholesterol: 81 mg/dL (ref 0–99)
Total CHOL/HDL Ratio: 3.1 RATIO
Triglycerides: 131 mg/dL (ref <150)
VLDL: 26 mg/dL (ref 0–40)

## 2012-05-19 LAB — COMPREHENSIVE METABOLIC PANEL
ALT: 19 U/L (ref 0–53)
AST: 17 U/L (ref 0–37)
Albumin: 3.1 g/dL — ABNORMAL LOW (ref 3.5–5.2)
Alkaline Phosphatase: 121 U/L — ABNORMAL HIGH (ref 39–117)
Calcium: 9 mg/dL (ref 8.4–10.5)
GFR calc Af Amer: 57 mL/min — ABNORMAL LOW (ref >90)
Potassium: 4.9 mEq/L (ref 3.5–5.1)
Sodium: 140 mEq/L (ref 135–145)
Total Protein: 6.7 g/dL (ref 6.0–8.3)

## 2012-05-19 LAB — PHENYTOIN LEVEL, TOTAL: Phenytoin Lvl: 10.4 ug/mL (ref 10.0–20.0)

## 2012-05-19 MED ORDER — CLOPIDOGREL BISULFATE 75 MG PO TABS: 75.0000 mg | ORAL_TABLET | Freq: Every day | ORAL | Status: DC

## 2012-05-19 MED ORDER — PHENYTOIN SODIUM EXTENDED 100 MG PO CAPS
300.0000 mg | ORAL_CAPSULE | Freq: Every day | ORAL | Status: DC
  Filled 2012-05-19: qty 3

## 2012-05-19 MED ORDER — CLOPIDOGREL BISULFATE 75 MG PO TABS: 75.0000 mg | ORAL_TABLET | Freq: Every day | ORAL | Status: AC

## 2012-05-19 MED ORDER — CARBAMAZEPINE 200 MG PO TABS
400.0000 mg | ORAL_TABLET | Freq: Every day | ORAL | Status: DC
  Filled 2012-05-19 (×2): qty 2

## 2012-05-19 NOTE — Evaluation (Signed)
Physical Therapy Evaluation Patient Details Name: [name] MRN: [id number] DOB: 02-Jun-1928 Today's Date: 05/19/2012 Time: 7829-5621 PT Time Calculation (min): 20 min  PT Assessment / Plan / Recommendation Clinical Impression  Pt is a pleasent 79 y.o. male who presents for possible TIA. Pt with underlying gait deviations but is able to compensate. Patient states he is at baseline for amulation and daughter cooberates this statement. Patient demonstrates overall independence and modified independence for all aspects of mobility. Rec continued use of cane at home. Patient has all DME needed. No further acute needs at this time. PT will sign off.    PT Assessment  Patent does not need any further PT services                      Precautions / Restrictions Precautions Precautions: Fall Restrictions Weight Bearing Restrictions: No   Pertinent Vitals/Pain No pain at this time      Mobility  Bed Mobility Bed Mobility: Supine to Sit;Sitting - Scoot to Edge of Bed;Sit to Supine Supine to Sit: 7: Independent Sitting - Scoot to Edge of Bed: 7: Independent Sit to Supine: 7: Independent Transfers Transfers: Sit to Stand;Stand to Sit Sit to Stand: 7: Independent Stand to Sit: 7: Independent Ambulation/Gait Ambulation/Gait Assistance: 6: Modified independent (Device/Increase time) Ambulation Distance (Feet): 300 Feet Assistive device: Rolling walker Ambulation/Gait Assistance Details: Pt with some pre existing gait dysfunction but steady and able to self correct; pt states that he is at baseline and daughter cooberates this statement Gait Pattern: Step-through pattern;Ataxic;Narrow base of support Gait velocity: WFL General Gait Details: some underlying ataxic gait but overall steady Stairs: Yes Stairs Assistance: 5: Supervision Stairs Assistance Details (indicate cue type and reason): no physical assist required Stair Management Technique: Two rails;Alternating  pattern;Forwards Number of Stairs: 5 Modified Rankin (Stroke Patients Only) Pre-Morbid Rankin Score: Moderate disability Modified Rankin: Moderate disability    Exercises     PT Diagnosis:    PT Problem List:   PT Treatment Interventions:     PT Goals    Visit Information  Last PT Received On: 05/19/12 Assistance Needed: +1    Subjective Data  Subjective: I feel better today Patient Stated Goal: to go home with daughter   Prior Functioning  Home Living Lives With: Daughter Available Help at Discharge: Family;Available PRN/intermittently Type of Home: Mobile home Home Access: Ramped entrance Home Layout: One level Bathroom Shower/Tub: Engineer, manufacturing systems: Standard Prior Function Level of Independence: Independent with assistive device(s) (cane PRn) Able to Take Stairs?: Yes Driving: Yes Vocation: Retired Musician:  (wears glasses) Dominant Hand: Right    Cognition  Cognition Overall Cognitive Status: Appears within functional limits for tasks assessed/performed Arousal/Alertness: Awake/alert Orientation Level: Appears intact for tasks assessed;Oriented X4 / Intact Behavior During Session: [facility] for tasks performed    Extremity/Trunk Assessment Right Upper Extremity Assessment RUE ROM/Strength/Tone: WFL for tasks assessed RUE Sensation: WFL - Light Touch;WFL - Proprioception Left Upper Extremity Assessment LUE ROM/Strength/Tone: WFL for tasks assessed LUE Sensation: WFL - Light Touch;WFL - Proprioception Right Lower Extremity Assessment RLE ROM/Strength/Tone: WFL for tasks assessed RLE Sensation: WFL - Light Touch;WFL - Proprioception Left Lower Extremity Assessment LLE ROM/Strength/Tone: WFL for tasks assessed LLE Sensation: WFL - Light Touch;WFL - Proprioception LLE Coordination: Deficits LLE Coordination Deficits: underlying baseline coordination deficits   Balance Balance Balance Assessed: Yes High Level Balance High  Level Balance Activites: Side stepping;Backward walking;Direction changes;Turns;Sudden stops;Head turns High Level Balance Comments: steady with use  of rw  End of Session PT - End of Session Equipment Utilized During Treatment: Gait belt Activity Tolerance: Patient tolerated treatment well Patient left: in bed;with call bell/phone within reach;with nursing in room;with family/visitor present Nurse Communication: Mobility status  GP     Fabio Asa 05/19/2012, 3:32 PM Charlotte Crumb, PT DPT  [phone]

## 2012-05-19 NOTE — ED Provider Notes (Signed)
I saw and evaluated the patient, reviewed the resident's note and I agree with the findings and plan. Agree with EKG interpretation if present.   Pt with blurry vision and ataxia. CT and MRI neg, unclear etiology, will admit for further eval.   Charles B. Bernette Mayers, MD 05/19/12 [id number]

## 2012-05-19 NOTE — Progress Notes (Signed)
Patient dc paperwork complete and education done.  Prescription for plavix given to the patient and IV removed.  Patient daughter will drive him home.  Lance Bosch, RN

## 2012-05-19 NOTE — Progress Notes (Signed)
VASCULAR LAB PRELIMINARY  PRELIMINARY  PRELIMINARY  PRELIMINARY  Carotid duplex  completed.    Preliminary report:  Bilateral:  No evidence of hemodynamically significant internal carotid artery stenosis.   Vertebral artery flow is antegrade.      Jernie Schutt, RVT 05/19/2012, 3:32 PM

## 2012-05-19 NOTE — Progress Notes (Signed)
  Echocardiogram 2D Echocardiogram has been performed.  [place] Co 05/19/2012, 5:34 PM

## 2012-05-19 NOTE — Discharge Summary (Signed)
Physician Discharge Summary  [name] XBM:[id number] DOB: 29-Oct-1928 DOA: 05/18/2012  PCP: [name], MD  Admit date: 05/18/2012 Discharge date: 05/19/2012  Time spent: 35 minutes  Recommendations for Outpatient Follow-up:  1. Follow up with PCP and neurologist.   Discharge Diagnoses:    ATAXIA, Question TIA    HYPERLIPIDEMIA-MIXED   HYPERTENSION, BENIGN   CAD, AUTOLOGOUS BYPASS GRAFT   SEIZURE DISORDER   Type 2 diabetes mellitus with hyperosmolar nonketotic hyperglycemia   Discharge Condition: stable.  Diet recommendation: Heart Healthy.  Filed Weights   05/18/12 2333  Weight: 82.5 kg (181 lb 14.1 oz)    History of present illness:  [name] is a 79 y.o. male with history of seizures, CAD status post CABG, diabetes mellitus type 2, hypertension started experiencing imbalance since 9:30 AM and was brought to the ER due to persistent symptoms. Patient states that he accidentally took his evening medications which includes carbamazepine in the morning around 8:30 AM and around 9:30 AM he started experiencing imbalance. He was not able to walk due to imbalance and also started experiencing some diplopia and right lower extremity numbness. In the ER CT head was negative and at this time carbamazepine level and Dilantin levels are pending. MRI/MRA of the brain was done and results are pending. Patient otherwise denies any chest pain shortness of breath nausea vomiting diarrhea.   Hospital Course:  1-Ataxia: Question TIA. Symptoms resolved. PT evaluation pending. Will order ECHO and carotid doppler. If normal will discharge patient today. I will change aspirin to plavix. Continue with risk factors modifications.  Seizure: continue with home medications. Level dilantin and Carbamazepine normal.  CAD status post CABG - denies any chest pain.  Diabetes mellitus type 2 - continue home medications with sliding-scale coverage.  Hyperlipidemia - continue home medications.   Hypertension - continue home medications.   Procedures:  Carotid doppler pending.  ECHO pending.   Consultations:  none  Discharge Exam: Filed Vitals:   05/18/12 2333 05/19/12 0237 05/19/12 0542 05/19/12 1339  BP: 171/78 139/66 148/70 133/58  Pulse: 66 58 63 67  Temp: 97.4 F (36.3 C) 97.7 F (36.5 C) 97.8 F (36.6 C) 97.9 F (36.6 C)  TempSrc: Oral Oral Oral Oral  Resp: 18 18 20 18   Height: 5\' 10"  (1.778 m)     Weight: 82.5 kg (181 lb 14.1 oz)     SpO2: 100% 100% 100% 100%    General: no distress. Cardiovascular: S 1, S 2 RRR Respiratory: CTA Neuro non focal.  Discharge Instructions  Discharge Orders   Future Orders Complete By Expires     Diet - low sodium heart healthy  As directed     Increase activity slowly  As directed         Medication List    STOP taking these medications       aspirin 81 MG chewable tablet      TAKE these medications       amLODipine 10 MG tablet  Commonly known as:  NORVASC  Take 10 mg by mouth daily.     carbamazepine 200 MG tablet  Commonly known as:  TEGRETOL  Take 400 mg by mouth at bedtime.     carvedilol 25 MG tablet  Commonly known as:  COREG  Take 25 mg by mouth 2 (two) times daily with a meal.     clopidogrel 75 MG tablet  Commonly known as:  PLAVIX  Take 1 tablet (75 mg total) by mouth  daily with breakfast.  Start taking on:  05/20/2012     glyBURIDE 5 MG tablet  Commonly known as:  DIABETA  Take 5 mg by mouth daily with breakfast.     insulin glargine 100 UNIT/ML injection  Commonly known as:  LANTUS  Inject 25 Units into the skin at bedtime.     LORazepam 1 MG tablet  Commonly known as:  ATIVAN  Take 0.5-1 mg by mouth 2 (two) times daily as needed. For anxiety.     mirtazapine 15 MG disintegrating tablet  Commonly known as:  REMERON SOL-TAB  Take 15 mg by mouth at bedtime.     pantoprazole 40 MG tablet  Commonly known as:  PROTONIX  Take 40 mg by mouth daily.     phenytoin 30 MG ER  capsule  Commonly known as:  DILANTIN  Take 30 mg by mouth every evening.     phenytoin 100 MG ER capsule  Commonly known as:  DILANTIN  Take 300 mg by mouth at bedtime.     simvastatin 40 MG tablet  Commonly known as:  ZOCOR  Take 40 mg by mouth at bedtime.     tamsulosin 0.4 MG Caps  Commonly known as:  FLOMAX  Take 0.4 mg by mouth daily.     vitamin B-12 1000 MCG tablet  Commonly known as:  CYANOCOBALAMIN  Take 1,000 mcg by mouth daily.           Follow-up Information   Follow up with [name], MD In 1 week.   Contact information:   [address] [place] [address] [phone]        The results of significant diagnostics from this hospitalization (including imaging, microbiology, ancillary and laboratory) are listed below for reference.    Significant Diagnostic Studies: Ct Head Wo Contrast  05/18/2012  *RADIOLOGY REPORT*  Clinical Data: Right leg weakness.  Gait disturbance  CT HEAD WITHOUT CONTRAST  Technique:  Contiguous axial images were obtained from the base of the skull through the vertex without contrast.  Comparison: CT 02/09/2011  Findings: Generalized atrophy.  Negative for hydrocephalus.  Small chronic infarct left anterior thalamus.  This is unchanged.  No acute infarct.  Negative for hemorrhage or mass.  Calvarium intact.  IMPRESSION: No acute abnormality.   Original Report Authenticated By: Janeece Riggers, M.D.    Mr Angiogram Head Wo Contrast  05/18/2012  *RADIOLOGY REPORT*  Clinical Data:  Blurred vision and unsteady gait.  Slurred speech. Draining the right leg.  MRI HEAD WITHOUT CONTRAST MRA HEAD WITHOUT CONTRAST  Technique:  Multiplanar, multiecho pulse sequences of the brain and surrounding structures were obtained without intravenous contrast. Angiographic images of the head were obtained using MRA technique without contrast.  Comparison:  CT head without contrast 05/18/2012.  MRI brain 09/21/2010.  MRI HEAD  Findings:  The acute infarct,  hemorrhage, or mass lesion is present.  Mild generalized atrophy is within normal limits for age. The ventricles are of normal size. A remote lacunar infarct of the left thalamus is stable.  No significant extra-axial fluid collection is present.  Flow is present in the major intracranial arteries.  The patient is status post bilateral lens extractions. Mild mucosal thickening is present within the maxillary sinuses and ethmoid air cells.  There is minimal fluid in the mastoid air cells bilaterally.  No obstructing nasopharyngeal lesions are present.  IMPRESSION:  1.  Normal MRI brain for age. 2.  Minimal sinus disease.  MRA HEAD  Findings: Mild atherosclerotic irregularity is present within the cavernous carotid arteries bilaterally.  The internal carotid arteries are otherwise within normal limits.  The A1 and M1 segments are normal.  No definite anterior communicating artery is present.  The MCA bifurcations are within normal limits bilaterally.  The ACA and MCA branch vessels are within normal limits for age.  The left vertebral artery is the dominant vessel.  There is a stable high-grade stenosis of the right vertebral artery just beyond the PICA takeoff.  The posterior cerebral arteries are of fetal type with the basilar artery terminating at the superior cerebellar arteries.  Segmental irregularity is present within the PCA branch vessels bilaterally.  IMPRESSION:  1.  Mild small vessel disease is most evident in the posterior circulation and not significantly changed. 2.  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 3.  Stable mild atherosclerotic changes within the cavernous carotid arteries bilaterally. 4.  No acute or interval change.   Original Report Authenticated By: Marin Roberts, M.D.    Mr Brain Wo Contrast  05/18/2012  *RADIOLOGY REPORT*  Clinical Data:  Blurred vision and unsteady gait.  Slurred speech. Draining the right leg.  MRI  HEAD WITHOUT CONTRAST MRA HEAD WITHOUT CONTRAST  Technique:  Multiplanar, multiecho pulse sequences of the brain and surrounding structures were obtained without intravenous contrast. Angiographic images of the head were obtained using MRA technique without contrast.  Comparison:  CT head without contrast 05/18/2012.  MRI brain 09/21/2010.  MRI HEAD  Findings:  The acute infarct, hemorrhage, or mass lesion is present.  Mild generalized atrophy is within normal limits for age. The ventricles are of normal size. A remote lacunar infarct of the left thalamus is stable.  No significant extra-axial fluid collection is present.  Flow is present in the major intracranial arteries.  The patient is status post bilateral lens extractions. Mild mucosal thickening is present within the maxillary sinuses and ethmoid air cells.  There is minimal fluid in the mastoid air cells bilaterally.  No obstructing nasopharyngeal lesions are present.  IMPRESSION:  1.  Normal MRI brain for age. 2.  Minimal sinus disease.  MRA HEAD  Findings: Mild atherosclerotic irregularity is present within the cavernous carotid arteries bilaterally.  The internal carotid arteries are otherwise within normal limits.  The A1 and M1 segments are normal.  No definite anterior communicating artery is present.  The MCA bifurcations are within normal limits bilaterally.  The ACA and MCA branch vessels are within normal limits for age.  The left vertebral artery is the dominant vessel.  There is a stable high-grade stenosis of the right vertebral artery just beyond the PICA takeoff.  The posterior cerebral arteries are of fetal type with the basilar artery terminating at the superior cerebellar arteries.  Segmental irregularity is present within the PCA branch vessels bilaterally.  IMPRESSION:  1.  Mild small vessel disease is most evident in the posterior circulation and not significantly changed. 2.  Chronic high-grade stenosis of the nondominant right  vertebral artery just beyond the PICA takeoff. 3.  Stable mild atherosclerotic changes within the cavernous carotid arteries bilaterally. 4.  No acute or interval change.   Original Report Authenticated By: Marin Roberts, M.D.     Microbiology: No results found for this or any previous visit (from the past 240 hour(s)).   Labs: Basic Metabolic Panel:  Recent Labs Lab 05/18/12 1931 05/19/12 0500  NA 137 140  K 5.1 4.9  CL 103 107  CO2 24 26  GLUCOSE 147* 98  BUN 22 21  CREATININE 1.35 1.32  CALCIUM 9.4 9.0   Liver Function Tests:  Recent Labs Lab 05/19/12 0500  AST 17  ALT 19  ALKPHOS 121*  BILITOT 0.2*  PROT 6.7  ALBUMIN 3.1*   No results found for this basename: LIPASE, AMYLASE,  in the last 168 hours No results found for this basename: AMMONIA,  in the last 168 hours CBC:  Recent Labs Lab 05/18/12 1931 05/19/12 0500  WBC 9.0 7.6  NEUTROABS 4.5 3.7  HGB 12.8* 11.6*  HCT 35.4* 33.5*  MCV 88.5 89.1  PLT 215 213   Cardiac Enzymes: No results found for this basename: CKTOTAL, CKMB, CKMBINDEX, TROPONINI,  in the last 168 hours BNP: BNP (last 3 results) No results found for this basename: PROBNP,  in the last 8760 hours CBG:  Recent Labs Lab 05/19/12 1131  GLUCAP 169*       Signed:  Mariem Skolnick  [facility] 05/19/2012, 1:51 PM

## 2012-05-19 NOTE — Progress Notes (Signed)
Refused medications due to uncertainty of meds taken at home and wants to flush out what he has in his system.

## 2012-05-19 NOTE — Progress Notes (Signed)
Utilization review completed. Dezi Schaner, RN, BSN. 

## 2012-05-24 LAB — PHENYTOIN LEVEL, FREE AND TOTAL
Phenytoin Bound: 11.5 mg/L
Phenytoin, Free: 2 mg/L (ref 1.0–2.0)
Phenytoin, Total: 13.5 mg/L (ref 10.0–20.0)

## 2012-12-25 ENCOUNTER — Encounter (HOSPITAL_COMMUNITY): Payer: Self-pay | Admitting: Emergency Medicine

## 2012-12-25 ENCOUNTER — Emergency Department (HOSPITAL_COMMUNITY): Payer: Medicare Other

## 2012-12-25 ENCOUNTER — Inpatient Hospital Stay (HOSPITAL_COMMUNITY)
Admission: EM | Admit: 2012-12-25 | Discharge: 2012-12-29 | DRG: 470 | Disposition: A | Discharge status: Skilled Nursing Facility | Payer: Medicare Other | Attending: Internal Medicine | Admitting: Internal Medicine

## 2012-12-25 ENCOUNTER — Inpatient Hospital Stay (HOSPITAL_COMMUNITY): Payer: Medicare Other

## 2012-12-25 DIAGNOSIS — D649 Anemia, unspecified: Secondary | ICD-10-CM

## 2012-12-25 DIAGNOSIS — E871 Hypo-osmolality and hyponatremia: Secondary | ICD-10-CM | POA: Diagnosis present

## 2012-12-25 DIAGNOSIS — D62 Acute posthemorrhagic anemia: Secondary | ICD-10-CM | POA: Diagnosis not present

## 2012-12-25 DIAGNOSIS — G40909 Epilepsy, unspecified, not intractable, without status epilepticus: Secondary | ICD-10-CM | POA: Diagnosis present

## 2012-12-25 DIAGNOSIS — Z794 Long term (current) use of insulin: Secondary | ICD-10-CM

## 2012-12-25 DIAGNOSIS — Z8249 Family history of ischemic heart disease and other diseases of the circulatory system: Secondary | ICD-10-CM

## 2012-12-25 DIAGNOSIS — N179 Acute kidney failure, unspecified: Secondary | ICD-10-CM

## 2012-12-25 DIAGNOSIS — S72009A Fracture of unspecified part of neck of unspecified femur, initial encounter for closed fracture: Principal | ICD-10-CM

## 2012-12-25 DIAGNOSIS — E785 Hyperlipidemia, unspecified: Secondary | ICD-10-CM | POA: Diagnosis present

## 2012-12-25 DIAGNOSIS — E872 Acidosis, unspecified: Secondary | ICD-10-CM | POA: Diagnosis present

## 2012-12-25 DIAGNOSIS — I2510 Atherosclerotic heart disease of native coronary artery without angina pectoris: Secondary | ICD-10-CM | POA: Diagnosis present

## 2012-12-25 DIAGNOSIS — N189 Chronic kidney disease, unspecified: Secondary | ICD-10-CM | POA: Diagnosis present

## 2012-12-25 DIAGNOSIS — W19XXXA Unspecified fall, initial encounter: Secondary | ICD-10-CM | POA: Diagnosis present

## 2012-12-25 DIAGNOSIS — S72001A Fracture of unspecified part of neck of right femur, initial encounter for closed fracture: Secondary | ICD-10-CM

## 2012-12-25 DIAGNOSIS — I252 Old myocardial infarction: Secondary | ICD-10-CM

## 2012-12-25 DIAGNOSIS — K219 Gastro-esophageal reflux disease without esophagitis: Secondary | ICD-10-CM | POA: Diagnosis present

## 2012-12-25 DIAGNOSIS — Y93H1 Activity, digging, shoveling and raking: Secondary | ICD-10-CM

## 2012-12-25 DIAGNOSIS — E119 Type 2 diabetes mellitus without complications: Secondary | ICD-10-CM | POA: Diagnosis present

## 2012-12-25 DIAGNOSIS — I25810 Atherosclerosis of coronary artery bypass graft(s) without angina pectoris: Secondary | ICD-10-CM | POA: Diagnosis present

## 2012-12-25 DIAGNOSIS — I10 Essential (primary) hypertension: Secondary | ICD-10-CM | POA: Diagnosis present

## 2012-12-25 DIAGNOSIS — I129 Hypertensive chronic kidney disease with stage 1 through stage 4 chronic kidney disease, or unspecified chronic kidney disease: Secondary | ICD-10-CM | POA: Diagnosis present

## 2012-12-25 DIAGNOSIS — E875 Hyperkalemia: Secondary | ICD-10-CM | POA: Diagnosis present

## 2012-12-25 DIAGNOSIS — R569 Unspecified convulsions: Secondary | ICD-10-CM | POA: Diagnosis present

## 2012-12-25 DIAGNOSIS — F411 Generalized anxiety disorder: Secondary | ICD-10-CM | POA: Diagnosis present

## 2012-12-25 DIAGNOSIS — Y92009 Unspecified place in unspecified non-institutional (private) residence as the place of occurrence of the external cause: Secondary | ICD-10-CM

## 2012-12-25 DIAGNOSIS — Z87891 Personal history of nicotine dependence: Secondary | ICD-10-CM

## 2012-12-25 DIAGNOSIS — S72051A Unspecified fracture of head of right femur, initial encounter for closed fracture: Secondary | ICD-10-CM

## 2012-12-25 DIAGNOSIS — Z951 Presence of aortocoronary bypass graft: Secondary | ICD-10-CM

## 2012-12-25 HISTORY — DX: Personal history of other diseases of the digestive system: Z87.19

## 2012-12-25 HISTORY — DX: Pneumonitis due to inhalation of food and vomit: J69.0

## 2012-12-25 HISTORY — DX: Personal history of peptic ulcer disease: Z87.11

## 2012-12-25 HISTORY — DX: Unspecified place in unspecified non-institutional (private) residence as the place of occurrence of the external cause: Y92.009

## 2012-12-25 HISTORY — DX: Low back pain, unspecified: M54.50

## 2012-12-25 HISTORY — DX: Orthopnea: R06.01

## 2012-12-25 HISTORY — DX: Depression, unspecified: F32.A

## 2012-12-25 HISTORY — DX: Unspecified fall, initial encounter: W19.XXXA

## 2012-12-25 HISTORY — DX: Low back pain: M54.5

## 2012-12-25 HISTORY — DX: Major depressive disorder, single episode, unspecified: F32.9

## 2012-12-25 HISTORY — DX: Other chronic pain: G89.29

## 2012-12-25 LAB — COMPREHENSIVE METABOLIC PANEL
ALT: 22 U/L (ref 0–53)
BUN: 26 mg/dL — ABNORMAL HIGH (ref 6–23)
CO2: 21 mEq/L (ref 19–32)
Calcium: 8.9 mg/dL (ref 8.4–10.5)
GFR calc Af Amer: 60 mL/min — ABNORMAL LOW (ref >90)
GFR calc non Af Amer: 52 mL/min — ABNORMAL LOW (ref >90)
Glucose, Bld: 229 mg/dL — ABNORMAL HIGH (ref 70–99)
Sodium: 135 mEq/L (ref 135–145)
Total Protein: 7.3 g/dL (ref 6.0–8.3)

## 2012-12-25 LAB — CBC WITH DIFFERENTIAL/PLATELET
Eosinophils Absolute: 0.3 10*3/uL (ref 0.0–0.7)
Eosinophils Relative: 3 % (ref 0–5)
HCT: 37.1 % — ABNORMAL LOW (ref 39.0–52.0)
Hemoglobin: 12.8 g/dL — ABNORMAL LOW (ref 13.0–17.0)
Lymphocytes Relative: 20 % (ref 12–46)
Lymphs Abs: 2.3 10*3/uL (ref 0.7–4.0)
MCH: 31.7 pg (ref 26.0–34.0)
MCV: 91.8 fL (ref 78.0–100.0)
Monocytes Absolute: 0.8 10*3/uL (ref 0.1–1.0)
Monocytes Relative: 7 % (ref 3–12)
Platelets: 244 10*3/uL (ref 150–400)
RBC: 4.04 MIL/uL — ABNORMAL LOW (ref 4.22–5.81)
WBC: 12 10*3/uL — ABNORMAL HIGH (ref 4.0–10.5)

## 2012-12-25 LAB — PROTIME-INR: Prothrombin Time: 13.1 seconds (ref 11.6–15.2)

## 2012-12-25 LAB — GLUCOSE, CAPILLARY: Glucose-Capillary: 176 mg/dL — ABNORMAL HIGH (ref 70–99)

## 2012-12-25 MED ORDER — MORPHINE SULFATE 4 MG/ML IJ SOLN
4.0000 mg | Freq: Once | INTRAMUSCULAR | Status: AC
  Administered 2012-12-25: 4 mg via INTRAVENOUS
  Filled 2012-12-25: qty 1

## 2012-12-25 MED ORDER — MORPHINE SULFATE 2 MG/ML IJ SOLN
2.0000 mg | Freq: Once | INTRAMUSCULAR | Status: AC
  Administered 2012-12-25: 2 mg via INTRAVENOUS
  Filled 2012-12-25: qty 1

## 2012-12-25 MED ORDER — CARVEDILOL 25 MG PO TABS
25.0000 mg | ORAL_TABLET | Freq: Two times a day (BID) | ORAL | Status: DC
  Administered 2012-12-26 – 2012-12-29 (×7): 25 mg via ORAL
  Filled 2012-12-25 (×9): qty 1

## 2012-12-25 MED ORDER — HYDROCODONE-ACETAMINOPHEN 5-325 MG PO TABS: 1.0000 | ORAL_TABLET | ORAL | Status: DC | PRN

## 2012-12-25 MED ORDER — TAMSULOSIN HCL 0.4 MG PO CAPS
0.4000 mg | ORAL_CAPSULE | Freq: Every day | ORAL | Status: DC
  Administered 2012-12-26 – 2012-12-29 (×4): 0.4 mg via ORAL
  Filled 2012-12-25 (×4): qty 1

## 2012-12-25 MED ORDER — HYDROCODONE-ACETAMINOPHEN 5-325 MG PO TABS
1.0000 | ORAL_TABLET | Freq: Four times a day (QID) | ORAL | Status: DC | PRN
  Administered 2012-12-26 – 2012-12-28 (×5): 2 via ORAL
  Administered 2012-12-28: 1 via ORAL
  Administered 2012-12-29: 2 via ORAL
  Administered 2012-12-29: 1 via ORAL
  Filled 2012-12-25: qty 1
  Filled 2012-12-25 (×6): qty 2
  Filled 2012-12-25: qty 1

## 2012-12-25 MED ORDER — INSULIN ASPART 100 UNIT/ML ~~LOC~~ SOLN
0.0000 [IU] | Freq: Three times a day (TID) | SUBCUTANEOUS | Status: DC
  Administered 2012-12-26: 2 [IU] via SUBCUTANEOUS
  Administered 2012-12-26: 1 [IU] via SUBCUTANEOUS
  Administered 2012-12-27: 3 [IU] via SUBCUTANEOUS
  Administered 2012-12-27: 1 [IU] via SUBCUTANEOUS
  Administered 2012-12-27: 5 [IU] via SUBCUTANEOUS
  Administered 2012-12-28 (×2): 3 [IU] via SUBCUTANEOUS
  Administered 2012-12-28: 2 [IU] via SUBCUTANEOUS
  Administered 2012-12-29 (×2): 3 [IU] via SUBCUTANEOUS
  Administered 2012-12-29: 2 [IU] via SUBCUTANEOUS

## 2012-12-25 MED ORDER — EXENATIDE ER 2 MG ~~LOC~~ PEN: 2.0000 mg | PEN_INJECTOR | SUBCUTANEOUS | Status: DC

## 2012-12-25 MED ORDER — PHENYTOIN SODIUM EXTENDED 30 MG PO CAPS: 30.0000 mg | ORAL_CAPSULE | Freq: Every day | ORAL | Status: DC

## 2012-12-25 MED ORDER — CHLORHEXIDINE GLUCONATE 4 % EX LIQD
60.0000 mL | Freq: Once | CUTANEOUS | Status: AC
  Administered 2012-12-26: 4 via TOPICAL
  Filled 2012-12-25 (×2): qty 60

## 2012-12-25 MED ORDER — SODIUM CHLORIDE 0.9 % IV SOLN
INTRAVENOUS | Status: AC
  Administered 2012-12-26 (×2): via INTRAVENOUS

## 2012-12-25 MED ORDER — DEXTROSE-NACL 5-0.45 % IV SOLN: 100.0000 mL/h | INTRAVENOUS | Status: DC

## 2012-12-25 MED ORDER — PANTOPRAZOLE SODIUM 40 MG PO TBEC
40.0000 mg | DELAYED_RELEASE_TABLET | Freq: Every day | ORAL | Status: DC
  Administered 2012-12-26 – 2012-12-29 (×4): 40 mg via ORAL
  Filled 2012-12-25 (×4): qty 1

## 2012-12-25 MED ORDER — MORPHINE SULFATE 2 MG/ML IJ SOLN: 0.5000 mg | INTRAMUSCULAR | Status: DC | PRN

## 2012-12-25 MED ORDER — PHENYTOIN SODIUM EXTENDED 30 MG PO CAPS
330.0000 mg | ORAL_CAPSULE | Freq: Every day | ORAL | Status: DC
  Administered 2012-12-26 – 2012-12-28 (×4): 330 mg via ORAL
  Filled 2012-12-25 (×5): qty 1

## 2012-12-25 MED ORDER — SIMVASTATIN 40 MG PO TABS
40.0000 mg | ORAL_TABLET | Freq: Every day | ORAL | Status: DC
  Administered 2012-12-26 – 2012-12-28 (×4): 40 mg via ORAL
  Filled 2012-12-25 (×5): qty 1

## 2012-12-25 MED ORDER — MORPHINE SULFATE 4 MG/ML IJ SOLN: 4.0000 mg | INTRAMUSCULAR | Status: DC | PRN

## 2012-12-25 MED ORDER — ONDANSETRON HCL 4 MG/2ML IJ SOLN: 4.0000 mg | Freq: Three times a day (TID) | INTRAMUSCULAR | Status: DC | PRN

## 2012-12-25 MED ORDER — ACETAMINOPHEN 500 MG PO TABS: 1000.0000 mg | ORAL_TABLET | Freq: Once | ORAL | Status: DC

## 2012-12-25 MED ORDER — SODIUM CHLORIDE 0.9 % IV SOLN
Freq: Once | INTRAVENOUS | Status: AC
  Administered 2012-12-25: 19:00:00 via INTRAVENOUS

## 2012-12-25 MED ORDER — MIRTAZAPINE 15 MG PO TBDP
15.0000 mg | ORAL_TABLET | Freq: Every day | ORAL | Status: DC
  Administered 2012-12-26 – 2012-12-28 (×4): 15 mg via ORAL
  Filled 2012-12-25 (×5): qty 1

## 2012-12-25 MED ORDER — AMLODIPINE BESYLATE 10 MG PO TABS
10.0000 mg | ORAL_TABLET | Freq: Every day | ORAL | Status: DC
  Administered 2012-12-26 – 2012-12-27 (×2): 10 mg via ORAL
  Filled 2012-12-25 (×2): qty 1

## 2012-12-25 MED ORDER — CEFAZOLIN SODIUM-DEXTROSE 2-3 GM-% IV SOLR
2.0000 g | INTRAVENOUS | Status: AC
  Administered 2012-12-26: 2 g via INTRAVENOUS
  Filled 2012-12-25 (×2): qty 50

## 2012-12-25 MED ORDER — HYDROMORPHONE HCL PF 1 MG/ML IJ SOLN: 0.1000 mg | INTRAMUSCULAR | Status: DC | PRN

## 2012-12-25 MED ORDER — PHENYTOIN SODIUM EXTENDED 100 MG PO CAPS: 300.0000 mg | ORAL_CAPSULE | Freq: Every day | ORAL | Status: DC

## 2012-12-25 MED ORDER — GLYBURIDE 5 MG PO TABS
5.0000 mg | ORAL_TABLET | Freq: Every day | ORAL | Status: DC
  Filled 2012-12-25 (×2): qty 1

## 2012-12-25 MED ORDER — POTASSIUM CHLORIDE IN NACL 20-0.45 MEQ/L-% IV SOLN
INTRAVENOUS | Status: DC
  Filled 2012-12-25 (×2): qty 1000

## 2012-12-25 MED ORDER — CARBAMAZEPINE 200 MG PO TABS
400.0000 mg | ORAL_TABLET | Freq: Every day | ORAL | Status: DC
  Administered 2012-12-26 – 2012-12-28 (×4): 400 mg via ORAL
  Filled 2012-12-25 (×5): qty 2

## 2012-12-25 MED ORDER — INSULIN GLARGINE 100 UNIT/ML ~~LOC~~ SOLN
20.0000 [IU] | Freq: Every day | SUBCUTANEOUS | Status: DC
  Administered 2012-12-26: 20 [IU] via SUBCUTANEOUS
  Filled 2012-12-25 (×2): qty 0.2

## 2012-12-25 MED ORDER — INSULIN GLARGINE 100 UNIT/ML ~~LOC~~ SOLN: 30.0000 [IU] | Freq: Every day | SUBCUTANEOUS | Status: DC

## 2012-12-25 NOTE — ED Provider Notes (Signed)
Pt presents with a mechanical fall while raking leaves outside.  Complains of right hip pain.  Unable to walk. Physical Exam  BP 152/79  Pulse 84  Temp(Src) 98.2 F (36.8 C) (Oral)  Resp 15  SpO2 99%  Physical Exam  Nursing note and vitals reviewed. Constitutional: He appears well-developed and well-nourished. No distress.  HENT:  Head: Normocephalic and atraumatic.  Right Ear: External ear normal.  Left Ear: External ear normal.  Eyes: Conjunctivae are normal. Right eye exhibits no discharge. Left eye exhibits no discharge. No scleral icterus.  Neck: Neck supple. No tracheal deviation present.  Cardiovascular: Normal rate.   Pulmonary/Chest: Effort normal. No stridor. No respiratory distress.  Musculoskeletal: He exhibits tenderness. He exhibits no edema.       Right hip: He exhibits decreased range of motion, tenderness, bony tenderness and deformity.  Neurological: He is alert. Cranial nerve deficit: no gross deficits.  Skin: Skin is warm and dry. No rash noted.  Psychiatric: He has a normal mood and affect.    ED Course  Procedures  MDM  Femoral neck fracture.  Plan on admission.  Ortho consult, medicine admission.  Temp admission orders for hip fracture.  Plan on OR tomorrow.   Medical screening examination/treatment/procedure(s) were conducted as a shared visit with non-physician practitioner(s) and myself.  I personally evaluated the patient during the encounter.       Celene Kras, MD 12/25/12 2118

## 2012-12-25 NOTE — Consult Note (Signed)
   ORTHOPAEDIC CONSULTATION  REQUESTING PHYSICIAN: Jon R Knapp, MD  Chief Complaint: Right femoral neck fracture  HPI: [name] is a 79 y.o. male who complains of tripped blowing leaves  Past Medical History  Diagnosis Date  . CAD (coronary artery disease)     s/p cabg 3/10...s/p MI age 44  . Seizure disorder   . Diabetes mellitus type II   . HTN (hypertension)   . HLD (hyperlipidemia)   . Anxiety disorder   . Tremor   . GERD (gastroesophageal reflux disease)   . Pulmonary vein stenosis   . Seizures   . Angina   . Chronic kidney disease   . Anxiety   . Myocardial infarction    Past Surgical History  Procedure Laterality Date  . Coronary artery bypass graft  3/10  . No past surgeries     History   Social History  . Marital Status: Widowed    Spouse Name: N/A    Number of Children: N/A  . Years of Education: N/A   Occupational History  . retired     building looms and furniture industry   Social History Main Topics  . Smoking status: Never Smoker   . Smokeless tobacco: Former User  . Alcohol Use: 0.0 oz/week     Comment: remote alcohol history but has not drank any for many years.  . Drug Use: None  . Sexual Activity: None   Other Topics Concern  . None   Social History Narrative  . None   Family History  Problem Relation Age of Onset  . Coronary artery disease    . Hypertension     No Known Allergies Prior to Admission medications   Medication Sig Start Date End Date Taking? Authorizing Provider  amLODipine (NORVASC) 10 MG tablet Take 10 mg by mouth daily.    Yes Historical Provider, MD  carbamazepine (EPITOL) 200 MG tablet Take 400 mg by mouth at bedtime.   Yes Historical Provider, MD  carvedilol (COREG) 25 MG tablet Take 25 mg by mouth 2 (two) times daily with a meal.    Yes Historical Provider, MD  clopidogrel (PLAVIX) 75 MG tablet Take 1 tablet (75 mg total) by mouth daily with breakfast. 05/20/12  Yes Belkys A Regalado, MD    Exenatide ER 2 MG PEN Inject 2 mg into the skin See admin instructions. Take on mondays nights   Yes Historical Provider, MD  glyBURIDE (DIABETA) 5 MG tablet Take 5 mg by mouth daily with breakfast.   Yes Historical Provider, MD  insulin glargine (LANTUS) 100 UNIT/ML injection Inject 30 Units into the skin at bedtime.    Yes Historical Provider, MD  mirtazapine (REMERON SOL-TAB) 15 MG disintegrating tablet Take 15 mg by mouth at bedtime.   Yes Historical Provider, MD  pantoprazole (PROTONIX) 40 MG tablet Take 40 mg by mouth daily.    Yes Historical Provider, MD  phenytoin (DILANTIN) 100 MG ER capsule Take 300 mg by mouth at bedtime.   Yes Historical Provider, MD  phenytoin (DILANTIN) 30 MG ER capsule Take 30 mg by mouth at bedtime.   Yes Historical Provider, MD  simvastatin (ZOCOR) 40 MG tablet Take 40 mg by mouth at bedtime.    Yes Historical Provider, MD  Tamsulosin HCl (FLOMAX) 0.4 MG CAPS Take 0.4 mg by mouth daily.   Yes Historical Provider, MD   Dg Hip Complete Right  12/25/2012   CLINICAL DATA:  Fall, hip pain.  EXAM: RIGHT HIP -   COMPLETE 2+ VIEW  COMPARISON:  None.  FINDINGS: There is a right femoral neck fracture. Minimal displacement. No significant angulation. No subluxation or dislocation.  IMPRESSION: Right femoral neck fracture with minimal displacement   Electronically Signed   By: Kevin  Dover M.D.   On: 12/25/2012 19:56    Positive ROS: All other systems have been reviewed and were otherwise negative with the exception of those mentioned in the HPI and as above.  Labs cbc  Recent Labs  12/25/12 1815  WBC 12.0*  HGB 12.8*  HCT 37.1*  PLT 244    Labs inflam No results found for this basename: ESR, CRP,  in the last 72 hours  Labs coag  Recent Labs  12/25/12 1837  INR 1.01     Recent Labs  12/25/12 1815  NA 135  K 5.0  CL 103  CO2 21  GLUCOSE 229*  BUN 26*  CREATININE 1.25  CALCIUM 8.9    Physical Exam: Filed Vitals:   12/25/12 2100  BP: 152/79   Pulse: 84  Temp:   Resp:    General: Alert, no acute distress Cardiovascular: No pedal edema Respiratory: No cyanosis, no use of accessory musculature GI: No organomegaly, abdomen is soft and non-tender Skin: No lesions in the area of chief complaint Neurologic: Sensation intact distally Psychiatric: Patient is competent for consent with normal mood and affect Lymphatic: No axillary or cervical lymphadenopathy  MUSCULOSKELETAL:  RLE: Pain with ROM, SKin Benign, Distally NVI Other extremities are atraumatic with painless ROM and NVI.  Assessment: R fem neck fracute  Plan: R hemiarthroplasty tuesday Weight Bearing Status: bedrest for now PT VTE px: SCD's and restart chemical px post op   Nioma Mccubbins, D, MD Cell [phone]   12/25/2012 9:17 PM     

## 2012-12-25 NOTE — ED Notes (Signed)
Pt was outside blowing leaves and states he tripped over the blower cord and fell. Pt states he bumped the rt side of his head and also has some rt sided hip pain. Pt states he was unable to move after falling. No visible deformity or rotation to rt leg. Pt rates pain 8/10. Pt alert and oriented. States he did not lose consciousness and remembers the fall.

## 2012-12-25 NOTE — H&P (Signed)
[facility] History and Physical  [name] ZOX:[id number] DOB: 10/24/1928 DOA: 12/25/2012  Referring physician: ER physician. PCP: [name], MD   Chief Complaint: Fall with right hip pain.  HPI: [name] is a 79 y.o. male history of CAD status post CABG, seizures, diabetes mellitus, hypertension and hyperlipidemia had a fall at his yard today. Patient did not lose consciousness. He was brought to the ER where x-rays revealed a right hip fracture. On call orthopedic surgeon was consulted and patient has been admitted for further management. Patient denies any chest pain shortness of breath nausea vomiting headache focal deficits abdominal pain or diarrhea.   Review of Systems: As presented in the history of presenting illness, rest negative.  Past Medical History  Diagnosis Date  . CAD (coronary artery disease)     s/p cabg 3/10...s/p MI age 82  . Seizure disorder   . Diabetes mellitus type II   . HTN (hypertension)   . HLD (hyperlipidemia)   . Anxiety disorder   . Tremor   . GERD (gastroesophageal reflux disease)   . Pulmonary vein stenosis   . Seizures   . Angina   . Chronic kidney disease   . Anxiety   . Myocardial infarction    Past Surgical History  Procedure Laterality Date  . Coronary artery bypass graft  3/10  . No past surgeries     Social History:  reports that he has never smoked. He has quit using smokeless tobacco. He reports that he drinks alcohol. His drug history is not on file. Where does patient live home. Can patient participate in ADLs? Yes.  No Known Allergies  Family History:  Family History  Problem Relation Age of Onset  . Coronary artery disease    . Hypertension        Prior to Admission medications   Medication Sig Start Date End Date Taking? Authorizing Provider  amLODipine (NORVASC) 10 MG tablet Take 10 mg by mouth daily.    Yes Historical Provider, MD  carbamazepine (EPITOL) 200 MG tablet Take 400 mg by  mouth at bedtime.   Yes Historical Provider, MD  carvedilol (COREG) 25 MG tablet Take 25 mg by mouth 2 (two) times daily with a meal.    Yes Historical Provider, MD  clopidogrel (PLAVIX) 75 MG tablet Take 1 tablet (75 mg total) by mouth daily with breakfast. 05/20/12  Yes Belkys A Regalado, MD  Exenatide ER 2 MG PEN Inject 2 mg into the skin See admin instructions. Take on mondays nights   Yes Historical Provider, MD  glyBURIDE (DIABETA) 5 MG tablet Take 5 mg by mouth daily with breakfast.   Yes Historical Provider, MD  insulin glargine (LANTUS) 100 UNIT/ML injection Inject 30 Units into the skin at bedtime.    Yes Historical Provider, MD  mirtazapine (REMERON SOL-TAB) 15 MG disintegrating tablet Take 15 mg by mouth at bedtime.   Yes Historical Provider, MD  pantoprazole (PROTONIX) 40 MG tablet Take 40 mg by mouth daily.    Yes Historical Provider, MD  phenytoin (DILANTIN) 100 MG ER capsule Take 300 mg by mouth at bedtime.   Yes Historical Provider, MD  phenytoin (DILANTIN) 30 MG ER capsule Take 30 mg by mouth at bedtime.   Yes Historical Provider, MD  simvastatin (ZOCOR) 40 MG tablet Take 40 mg by mouth at bedtime.    Yes Historical Provider, MD  Tamsulosin HCl (FLOMAX) 0.4 MG CAPS Take 0.4 mg by mouth daily.   Yes Historical  Provider, MD    Physical Exam: Filed Vitals:   12/25/12 2030 12/25/12 2100 12/25/12 2130 12/25/12 2210  BP: 168/84 152/79 148/74 176/74  Pulse: 92 84 88 82  Temp:    98.6 F (37 C)  TempSrc:      Resp:    18  SpO2: 100% 99% 100% 100%     General:  Well-developed well-nourished.  Eyes: Anicteric no pallor.  ENT: No discharge from the ears eyes nose mouth.  Neck: No mass felt.  Cardiovascular: S1-S2 heard.  Respiratory: No rhonchi or crepitations.  Abdomen: Soft nontender bowel sounds present.  Skin: No rash.  Musculoskeletal: Pain on moving right hip.  Psychiatric: Appears normal.  Neurologic: Alert. Oriented to time place and person. Moves all  extremities.  Labs on Admission:  Basic Metabolic Panel:  Recent Labs Lab 12/25/12 1815  NA 135  K 5.0  CL 103  CO2 21  GLUCOSE 229*  BUN 26*  CREATININE 1.25  CALCIUM 8.9   Liver Function Tests:  Recent Labs Lab 12/25/12 1815  AST 23  ALT 22  ALKPHOS 123*  BILITOT 0.2*  PROT 7.3  ALBUMIN 3.5   No results found for this basename: LIPASE, AMYLASE,  in the last 168 hours No results found for this basename: AMMONIA,  in the last 168 hours CBC:  Recent Labs Lab 12/25/12 1815  WBC 12.0*  NEUTROABS 8.5*  HGB 12.8*  HCT 37.1*  MCV 91.8  PLT 244   Cardiac Enzymes: No results found for this basename: CKTOTAL, CKMB, CKMBINDEX, TROPONINI,  in the last 168 hours  BNP (last 3 results) No results found for this basename: PROBNP,  in the last 8760 hours CBG:  Recent Labs Lab 12/25/12 2206  GLUCAP 176*    Radiological Exams on Admission: Dg Hip Complete Right  12/25/2012   CLINICAL DATA:  Fall, hip pain.  EXAM: RIGHT HIP - COMPLETE 2+ VIEW  COMPARISON:  None.  FINDINGS: There is a right femoral neck fracture. Minimal displacement. No significant angulation. No subluxation or dislocation.  IMPRESSION: Right femoral neck fracture with minimal displacement   Electronically Signed   By: Charlett Nose M.D.   On: 12/25/2012 19:56   Dg Chest Port 1 View  12/25/2012   CLINICAL DATA:  Pre hip surgery evaluation.  EXAM: PORTABLE CHEST - 1 VIEW  COMPARISON:  08/26/2011.  FINDINGS: Normal sized heart. Clear lungs with normal vascularity. Post CABG changes. Unremarkable bones.  IMPRESSION: No acute abnormality.   Electronically Signed   By: Gordan Payment M.D.   On: 12/25/2012 22:57     Assessment/Plan Principal Problem:   Hip fracture Active Problems:   HYPERTENSION, BENIGN   CAD, AUTOLOGOUS BYPASS GRAFT   SEIZURE DISORDER   Diabetes mellitus   1. Right hip fracture - from medical standpoint of view patient has medically stable for surgery. Further recommendations per  orthopedic surgery. 2. Diabetes mellitus type 2 - since patient is placed n.p.o. I have decreased patient's Lantus insulin dose from 30 units to 20 units. Change back to home dose once patient has had surgery. Closely follow CBGs. 3. CAD status post CABG - denies any chest pain. Hold Plavix for now due to surgery. 4. Seizure disorder - continue home medications. Check Dilantin levels and carbamazepine levels. 5. Hypertension - continue home medications. 6. Hyperlipidemia - continue home medications.  Patient's EKG UA and repeat labs are pending.  Code Status: Full code.  Family Communication: None.  Disposition Plan: Admit to inpatient.  Jahshua Bonito N. [facility] Pager [phone].  If 7PM-7AM, please contact night-coverage [email or web address] Password [email or web address] 12/25/2012, 11:09 PM

## 2012-12-25 NOTE — ED Notes (Signed)
Sandra-(daughter) [phone] cell

## 2012-12-25 NOTE — ED Provider Notes (Signed)
CSN: [id number]     Arrival date & time 12/25/12  1800 History   First MD Initiated Contact with Patient 12/25/12 1807     Chief Complaint  Patient presents with  . Hip Pain   (Consider location/radiation/quality/duration/timing/severity/associated sxs/prior Treatment) HPI Comments: The patient is a 79 year-old male with a past medical history of DM, Seizure disorder, CAD, Ataxia presenting the Emergency Department with a chief complaint of an unwitnessed mechanical fall and 8/10 pain in the right hip. The patient reports a fall occurred at home while he was in the yard blowing leaves and tripped on the cord.  He states he landed on the ground and hit his head on the ramp up to his house.  He reports he was unable to ambulate or move his right leg at home.  He states reports that movement increases the pain.  He denies headache at this time.  Denies LOC, dizziness or lightheadedness.    Patient is a 79 y.o. male presenting with hip pain. The history is provided by the patient.  Hip Pain    Past Medical History  Diagnosis Date  . CAD (coronary artery disease)     s/p cabg 3/10...s/p MI age 41  . Seizure disorder   . Diabetes mellitus type II   . HTN (hypertension)   . HLD (hyperlipidemia)   . Anxiety disorder   . Tremor   . GERD (gastroesophageal reflux disease)   . Pulmonary vein stenosis   . Seizures   . Angina   . Chronic kidney disease   . Anxiety   . Myocardial infarction    Past Surgical History  Procedure Laterality Date  . Coronary artery bypass graft  3/10  . No past surgeries     Family History  Problem Relation Age of Onset  . Coronary artery disease    . Hypertension     History  Substance Use Topics  . Smoking status: Never Smoker   . Smokeless tobacco: Former Neurosurgeon  . Alcohol Use: 0.0 oz/week     Comment: remote alcohol history but has not drank any for many years.    Review of Systems  All other systems reviewed and are negative.    Allergies   Review of patient's allergies indicates no known allergies.  Home Medications   Current Outpatient Rx  Name  Route  Sig  Dispense  Refill  . amLODipine (NORVASC) 10 MG tablet   Oral   Take 10 mg by mouth daily.          . carbamazepine (EPITOL) 200 MG tablet   Oral   Take 400 mg by mouth at bedtime.         . carvedilol (COREG) 25 MG tablet   Oral   Take 25 mg by mouth 2 (two) times daily with a meal.          . clopidogrel (PLAVIX) 75 MG tablet   Oral   Take 1 tablet (75 mg total) by mouth daily with breakfast.   30 tablet   0   . Exenatide ER 2 MG PEN   Subcutaneous   Inject 2 mg into the skin See admin instructions. Take on mondays nights         . glyBURIDE (DIABETA) 5 MG tablet   Oral   Take 5 mg by mouth daily with breakfast.         . insulin glargine (LANTUS) 100 UNIT/ML injection   Subcutaneous   Inject 30 Units  into the skin at bedtime.          . mirtazapine (REMERON SOL-TAB) 15 MG disintegrating tablet   Oral   Take 15 mg by mouth at bedtime.         . pantoprazole (PROTONIX) 40 MG tablet   Oral   Take 40 mg by mouth daily.          . phenytoin (DILANTIN) 100 MG ER capsule   Oral   Take 300 mg by mouth at bedtime.         . phenytoin (DILANTIN) 30 MG ER capsule   Oral   Take 30 mg by mouth at bedtime.         . simvastatin (ZOCOR) 40 MG tablet   Oral   Take 40 mg by mouth at bedtime.          . Tamsulosin HCl (FLOMAX) 0.4 MG CAPS   Oral   Take 0.4 mg by mouth daily.          BP 198/68  Pulse 69  Temp(Src) 98.2 F (36.8 C) (Oral)  Resp 15  SpO2 100% Physical Exam  Nursing note and vitals reviewed. Constitutional: He is oriented to person, place, and time. He appears well-developed and well-nourished.  HENT:  Head: Normocephalic and atraumatic.  Eyes: Pupils are equal, round, and reactive to light. No scleral icterus.  Neck: Neck supple.  Cardiovascular: Normal rate, regular rhythm and normal heart sounds.    No murmur heard. Pulmonary/Chest: Effort normal and breath sounds normal. No respiratory distress. He has no wheezes. He has no rales.  Abdominal: Soft. Bowel sounds are normal. There is no tenderness. There is no rebound and no guarding.  Musculoskeletal:       Right elbow: He exhibits normal range of motion.       Left elbow: He exhibits normal range of motion.       Right wrist: He exhibits no tenderness.       Left wrist: He exhibits no tenderness.       Right hip: He exhibits decreased range of motion and tenderness.       Right knee: No tenderness found.       Left knee: No tenderness found.  Leg is externally rotated is not shortened.  Patient reports he is unable to move it during exam but is able to move his toes. N/V intact distally.  Abrasions to bilateral forearm.  Neurological: He is alert and oriented to person, place, and time. No cranial nerve deficit.  Skin: Skin is warm and dry. No rash noted.  Psychiatric: He has a normal mood and affect.    ED Course  Procedures (including critical care time) Labs Review Labs Reviewed  CBC WITH DIFFERENTIAL - Abnormal; Notable for the following:    WBC 12.0 (*)    RBC 4.04 (*)    Hemoglobin 12.8 (*)    HCT 37.1 (*)    Neutro Abs 8.5 (*)    All other components within normal limits  COMPREHENSIVE METABOLIC PANEL - Abnormal; Notable for the following:    Glucose, Bld 229 (*)    BUN 26 (*)    Alkaline Phosphatase 123 (*)    Total Bilirubin 0.2 (*)    GFR calc non Af Amer 52 (*)    GFR calc Af Amer 60 (*)    All other components within normal limits  PROTIME-INR  TYPE AND SCREEN   Imaging Review Dg Hip Complete Right  12/25/2012  CLINICAL DATA:  Fall, hip pain.  EXAM: RIGHT HIP - COMPLETE 2+ VIEW  COMPARISON:  None.  FINDINGS: There is a right femoral neck fracture. Minimal displacement. No significant angulation. No subluxation or dislocation.  IMPRESSION: Right femoral neck fracture with minimal displacement    Electronically Signed   By: Charlett Nose M.D.   On: 12/25/2012 19:56    EKG Interpretation   None       MDM  No diagnosis found.  The patient with a history of mechanical fall. PE with tenderness to palpation and is externally rotated. No signs of significant head trauma or neurologic deficits during PE. Labs and imaging ordered.  Pain medication ordered.  Discussed patient condition with Dr. Lynelle Doctor.  Discussed lab results, imagine results, and treatment plan with the patient and patient's family.  He complains of pain, pain medication ordered.  Dr. Lynelle Doctor will consult Ortho for admission.     Clabe Seal, PA-C 12/25/12 2025

## 2012-12-26 ENCOUNTER — Encounter (HOSPITAL_COMMUNITY): Payer: Medicare Other | Admitting: Certified Registered"

## 2012-12-26 ENCOUNTER — Encounter (HOSPITAL_COMMUNITY): Payer: Self-pay | Admitting: General Practice

## 2012-12-26 ENCOUNTER — Inpatient Hospital Stay (HOSPITAL_COMMUNITY): Payer: Medicare Other | Admitting: Certified Registered"

## 2012-12-26 ENCOUNTER — Inpatient Hospital Stay (HOSPITAL_COMMUNITY): Payer: Medicare Other

## 2012-12-26 ENCOUNTER — Encounter (HOSPITAL_COMMUNITY)
Admission: EM | Disposition: A | Discharge status: Skilled Nursing Facility | Payer: Self-pay | Source: Home / Self Care | Attending: Internal Medicine

## 2012-12-26 DIAGNOSIS — S72033A Displaced midcervical fracture of unspecified femur, initial encounter for closed fracture: Secondary | ICD-10-CM

## 2012-12-26 HISTORY — PX: HIP ARTHROPLASTY: SHX981

## 2012-12-26 LAB — CBC
HCT: 32.6 % — ABNORMAL LOW (ref 39.0–52.0)
Hemoglobin: 11.5 g/dL — ABNORMAL LOW (ref 13.0–17.0)
MCH: 32 pg (ref 26.0–34.0)
MCV: 90.8 fL (ref 78.0–100.0)
Platelets: 221 10*3/uL (ref 150–400)
RBC: 3.59 MIL/uL — ABNORMAL LOW (ref 4.22–5.81)
WBC: 14.9 10*3/uL — ABNORMAL HIGH (ref 4.0–10.5)

## 2012-12-26 LAB — BASIC METABOLIC PANEL
CO2: 20 mEq/L (ref 19–32)
Calcium: 8.3 mg/dL — ABNORMAL LOW (ref 8.4–10.5)
Chloride: 105 mEq/L (ref 96–112)
Creatinine, Ser: 1.23 mg/dL (ref 0.50–1.35)
Glucose, Bld: 193 mg/dL — ABNORMAL HIGH (ref 70–99)

## 2012-12-26 LAB — GLUCOSE, CAPILLARY
Glucose-Capillary: 181 mg/dL — ABNORMAL HIGH (ref 70–99)
Glucose-Capillary: 107 mg/dL — ABNORMAL HIGH (ref 70–99)
Glucose-Capillary: 150 mg/dL — ABNORMAL HIGH (ref 70–99)
Glucose-Capillary: 185 mg/dL — ABNORMAL HIGH (ref 70–99)

## 2012-12-26 LAB — CARBAMAZEPINE LEVEL, TOTAL: Carbamazepine Lvl: 5.6 ug/mL (ref 4.0–12.0)

## 2012-12-26 LAB — SURGICAL PCR SCREEN
MRSA, PCR: NEGATIVE
Staphylococcus aureus: NEGATIVE

## 2012-12-26 LAB — PHENYTOIN LEVEL, TOTAL: Phenytoin Lvl: 12.8 ug/mL (ref 10.0–20.0)

## 2012-12-26 SURGERY — HEMIARTHROPLASTY, HIP, DIRECT ANTERIOR APPROACH, FOR FRACTURE
Anesthesia: General | Site: Hip | Laterality: Right | Wound class: Clean

## 2012-12-26 MED ORDER — LIDOCAINE HCL (CARDIAC) 20 MG/ML IV SOLN
INTRAVENOUS | Status: DC | PRN
  Administered 2012-12-26: 80 mg via INTRAVENOUS

## 2012-12-26 MED ORDER — GLYCOPYRROLATE 0.2 MG/ML IJ SOLN
INTRAMUSCULAR | Status: DC | PRN
  Administered 2012-12-26: .7 mg via INTRAVENOUS

## 2012-12-26 MED ORDER — ROCURONIUM BROMIDE 100 MG/10ML IV SOLN
INTRAVENOUS | Status: DC | PRN
  Administered 2012-12-26: 10 mg via INTRAVENOUS
  Administered 2012-12-26: 40 mg via INTRAVENOUS

## 2012-12-26 MED ORDER — INSULIN GLARGINE 100 UNIT/ML ~~LOC~~ SOLN
20.0000 [IU] | Freq: Every day | SUBCUTANEOUS | Status: DC
  Administered 2012-12-26: 20 [IU] via SUBCUTANEOUS
  Filled 2012-12-26 (×2): qty 0.2

## 2012-12-26 MED ORDER — DIAZEPAM 5 MG PO TABS
ORAL_TABLET | ORAL | Status: AC
  Filled 2012-12-26: qty 1

## 2012-12-26 MED ORDER — PHENYLEPHRINE HCL 10 MG/ML IJ SOLN
10.0000 mg | INTRAVENOUS | Status: DC | PRN
  Administered 2012-12-26: 10 ug/min via INTRAVENOUS

## 2012-12-26 MED ORDER — HYDROMORPHONE HCL PF 1 MG/ML IJ SOLN
INTRAMUSCULAR | Status: AC
  Filled 2012-12-26: qty 1

## 2012-12-26 MED ORDER — 0.9 % SODIUM CHLORIDE (POUR BTL) OPTIME
TOPICAL | Status: DC | PRN
  Administered 2012-12-26: 1000 mL

## 2012-12-26 MED ORDER — ARTIFICIAL TEARS OP OINT
TOPICAL_OINTMENT | OPHTHALMIC | Status: DC | PRN
  Administered 2012-12-26: 1 via OPHTHALMIC

## 2012-12-26 MED ORDER — ONDANSETRON HCL 4 MG/2ML IJ SOLN: 4.0000 mg | Freq: Once | INTRAMUSCULAR | Status: DC | PRN

## 2012-12-26 MED ORDER — PROPOFOL 10 MG/ML IV BOLUS
INTRAVENOUS | Status: DC | PRN
  Administered 2012-12-26: 100 mg via INTRAVENOUS

## 2012-12-26 MED ORDER — HYDROCODONE-ACETAMINOPHEN 5-325 MG PO TABS: 2.0000 | ORAL_TABLET | ORAL | Status: DC | PRN

## 2012-12-26 MED ORDER — FENTANYL CITRATE 0.05 MG/ML IJ SOLN
INTRAMUSCULAR | Status: DC | PRN
  Administered 2012-12-26: 50 ug via INTRAVENOUS
  Administered 2012-12-26: 100 ug via INTRAVENOUS
  Administered 2012-12-26: 50 ug via INTRAVENOUS

## 2012-12-26 MED ORDER — LACTATED RINGERS IV SOLN
INTRAVENOUS | Status: DC | PRN
  Administered 2012-12-26: 17:00:00 via INTRAVENOUS

## 2012-12-26 MED ORDER — HYDROMORPHONE HCL PF 1 MG/ML IJ SOLN
0.2500 mg | INTRAMUSCULAR | Status: DC | PRN
  Administered 2012-12-26 (×4): 0.5 mg via INTRAVENOUS

## 2012-12-26 MED ORDER — DIAZEPAM 5 MG PO TABS
5.0000 mg | ORAL_TABLET | Freq: Two times a day (BID) | ORAL | Status: DC | PRN
  Administered 2012-12-26: 5 mg via ORAL

## 2012-12-26 MED ORDER — ASPIRIN EC 325 MG PO TBEC: 325.0000 mg | DELAYED_RELEASE_TABLET | Freq: Every day | ORAL | Status: DC

## 2012-12-26 MED ORDER — GLUCERNA SHAKE PO LIQD
237.0000 mL | ORAL | Status: DC
  Administered 2012-12-27 – 2012-12-29 (×3): 237 mL via ORAL

## 2012-12-26 MED ORDER — NEOSTIGMINE METHYLSULFATE 1 MG/ML IJ SOLN
INTRAMUSCULAR | Status: DC | PRN
  Administered 2012-12-26: 4 mg via INTRAVENOUS

## 2012-12-26 MED ORDER — INFLUENZA VAC SPLIT QUAD 0.5 ML IM SUSP
0.5000 mL | Freq: Once | INTRAMUSCULAR | Status: DC
  Filled 2012-12-26: qty 0.5

## 2012-12-26 SURGICAL SUPPLY — 43 items
BLADE SAW RECIP 87.9 MT (BLADE) ×2 IMPLANT
CANISTER SUCTION 2500CC (MISCELLANEOUS) ×1 IMPLANT
CLOTH BEACON ORANGE TIMEOUT ST (SAFETY) ×2 IMPLANT
CLSR STERI-STRIP ANTIMIC 1/2X4 (GAUZE/BANDAGES/DRESSINGS) ×1 IMPLANT
COVER BACK TABLE 24X17X13 BIG (DRAPES) IMPLANT
DRAPE INCISE IOBAN 85X60 (DRAPES) ×2 IMPLANT
DRAPE ORTHO SPLIT 77X108 STRL (DRAPES) ×4
DRAPE SURG ORHT 6 SPLT 77X108 (DRAPES) ×2 IMPLANT
DRAPE U-SHAPE 47X51 STRL (DRAPES) ×2 IMPLANT
DRILL BIT 7/64X5 (BIT) ×2 IMPLANT
DRSG ADAPTIC 3X8 NADH LF (GAUZE/BANDAGES/DRESSINGS) ×2 IMPLANT
DRSG MEPILEX BORDER 4X8 (GAUZE/BANDAGES/DRESSINGS) ×2 IMPLANT
DURAPREP 26ML APPLICATOR (WOUND CARE) ×2 IMPLANT
ELECT CAUTERY BLADE 6.4 (BLADE) ×2 IMPLANT
ELECT REM PT RETURN 9FT ADLT (ELECTROSURGICAL) ×2
ELECTRODE REM PT RTRN 9FT ADLT (ELECTROSURGICAL) ×1 IMPLANT
FACESHIELD LNG OPTICON STERILE (SAFETY) ×2 IMPLANT
GLOVE BIO SURGEON STRL SZ7.5 (GLOVE) IMPLANT
GLOVE BIOGEL PI IND STRL 8 (GLOVE) ×1 IMPLANT
GLOVE BIOGEL PI INDICATOR 8 (GLOVE) ×1
GOWN PREVENTION PLUS XLARGE (GOWN DISPOSABLE) ×2 IMPLANT
GOWN STRL NON-REIN LRG LVL3 (GOWN DISPOSABLE) ×2 IMPLANT
HANDPIECE INTERPULSE COAX TIP (DISPOSABLE)
HIP HEMIARTHROPLASTY LEV 1A ×1 IMPLANT
KIT BASIN OR (CUSTOM PROCEDURE TRAY) ×2 IMPLANT
KIT ROOM TURNOVER OR (KITS) ×2 IMPLANT
MANIFOLD NEPTUNE II (INSTRUMENTS) ×1 IMPLANT
NS IRRIG 1000ML POUR BTL (IV SOLUTION) ×2 IMPLANT
PACK TOTAL JOINT (CUSTOM PROCEDURE TRAY) ×2 IMPLANT
PAD ARMBOARD 7.5X6 YLW CONV (MISCELLANEOUS) ×4 IMPLANT
PILLOW ABDUCTION HIP (SOFTGOODS) ×2 IMPLANT
RETRIEVER SUT HEWSON (MISCELLANEOUS) ×2 IMPLANT
SET HNDPC FAN SPRY TIP SCT (DISPOSABLE) ×1 IMPLANT
SUT FIBERWIRE #2 38 REV NDL BL (SUTURE) ×6
SUT MNCRL AB 4-0 PS2 18 (SUTURE) ×2 IMPLANT
SUT MON AB 2-0 CT1 36 (SUTURE) ×2 IMPLANT
SUT VIC AB 0 CT1 27 (SUTURE) ×4
SUT VIC AB 0 CT1 27XBRD ANBCTR (SUTURE) ×1 IMPLANT
SUTURE FIBERWR#2 38 REV NDL BL (SUTURE) ×2 IMPLANT
TOWEL OR 17X24 6PK STRL BLUE (TOWEL DISPOSABLE) ×2 IMPLANT
TOWEL OR 17X26 10 PK STRL BLUE (TOWEL DISPOSABLE) ×2 IMPLANT
TRAY FOLEY CATH 14FR (SET/KITS/TRAYS/PACK) IMPLANT
WATER STERILE IRR 1000ML POUR (IV SOLUTION) ×2 IMPLANT

## 2012-12-26 NOTE — Transfer of Care (Signed)
Immediate Anesthesia Transfer of Care Note  Patient: [name]  Procedure(s) Performed: Procedure(s): Right Hip Hemiarthroplasty (Right)  Patient Location: PACU  Anesthesia Type:General  Level of Consciousness: awake and alert   Airway & Oxygen Therapy: Patient Spontanous Breathing and Patient connected to nasal cannula oxygen  Post-op Assessment: Report given to PACU RN and Post -op Vital signs reviewed and stable  Post vital signs: Reviewed and stable  Complications: No apparent anesthesia complications

## 2012-12-26 NOTE — Care Management Utilization Note (Signed)
Utilization review completed. Aundria Bitterman, RN BSN 

## 2012-12-26 NOTE — Progress Notes (Signed)
INITIAL NUTRITION ASSESSMENT  DOCUMENTATION CODES Per approved criteria  -Not Applicable   INTERVENTION: Add Glucerna Shake po daily, each supplement provides 220 kcal and 10 grams of protein - once diet allows. RD discussed importance of nutrition with daughter at bedside. RD to continue to follow nutrition care plan.  NUTRITION DIAGNOSIS: Increased nutrient needs related to hip fx healing as evidenced by estimated needs.  Goal: Intake to meet >90% of estimated nutrition needs.  Monitor:  weight trends, lab trends, I/O's, PO intake, supplement tolerance  Reason for Assessment: MD Consult per Hip Fracture Protocol  79 y.o. male  Admitting Dx: Hip fracture  ASSESSMENT: PMHx significant for CAD s/p CABG, seizures, DM, HTN, and HLD. Admitted s/p fall in his yard. Work-up reveals R hip fx.  Plan to undergo Right Hip Hemiarthroplasty by ortho team. Currently off unit in CT-Imaging. Daughter in room, providing nutrition hx. She states that she lives with patient and that pt has been eating well recently. Per weight hx, pt with steady weight gain. Daughter notes that pt drinks a Glucerna Shake every morning - will order while here to help with healing. Discussed importance of nutrition for healing. Daughter verbalized understanding.   Height: Ht Readings from Last 1 Encounters:  05/18/12 5\' 10"  (1.778 m)    Weight: Wt Readings from Last 1 Encounters:  05/18/12 181 lb 14.1 oz (82.5 kg)    Ideal Body Weight: 166 lb  % Ideal Body Weight: 109%  Wt Readings from Last 10 Encounters:  05/18/12 181 lb 14.1 oz (82.5 kg)  02/26/11 70 lb 8 oz (31.979 kg)  02/10/11 161 lb 11.2 oz (73.347 kg)  05/13/10 163 lb (73.936 kg)  05/13/09 160 lb (72.576 kg)  07/03/08 159 lb (72.122 kg)  05/14/08 162 lb (73.483 kg)    Usual Body Weight: 160 lb  % Usual Body Weight: 113%  BMI:  26 - overweight  Estimated Nutritional Needs: Kcal: 1700 - 1900 Protein: 80 - 90 g Fluid: 1.8 - 2 liters  daily  Skin: intact  Diet Order: NPO  EDUCATION NEEDS: -Education needs addressed   Intake/Output Summary (Last 24 hours) at 12/26/12 1007 Last data filed at 12/26/12 0857  Gross per 24 hour  Intake      0 ml  Output    450 ml  Net   -450 ml    Last BM: 11/10  Labs:   Recent Labs Lab 12/25/12 1815 12/26/12 0525  NA 135 135  K 5.0 4.5  CL 103 105  CO2 21 20  BUN 26* 23  CREATININE 1.25 1.23  CALCIUM 8.9 8.3*  GLUCOSE 229* 193*    CBG (last 3)   Recent Labs  12/25/12 2206 12/26/12 0651  GLUCAP 176* 181*    Scheduled Meds: . amLODipine  10 mg Oral Daily  . carbamazepine  400 mg Oral QHS  . carvedilol  25 mg Oral BID WC  .  ceFAZolin (ANCEF) IV  2 g Intravenous On Call to OR  . chlorhexidine  60 mL Topical Once  . [START ON 01/01/2013] Exenatide ER  2 mg Subcutaneous Q Mon-1800  . glyBURIDE  5 mg Oral Q breakfast  . influenza vac split quadrivalent PF  0.5 mL Intramuscular Once  . insulin aspart  0-9 Units Subcutaneous TID WC  . insulin glargine  20 Units Subcutaneous QHS  . mirtazapine  15 mg Oral QHS  . pantoprazole  40 mg Oral Daily  . phenytoin  330 mg Oral QHS  .  simvastatin  40 mg Oral QHS  . tamsulosin  0.4 mg Oral Daily    Continuous Infusions: . sodium chloride 75 mL/hr at 12/26/12 8469    Past Medical History  Diagnosis Date  . CAD (coronary artery disease)     s/p cabg 3/10...s/p MI age 4  . Diabetes mellitus type II   . HTN (hypertension)   . HLD (hyperlipidemia)   . Anxiety disorder   . Tremor   . GERD (gastroesophageal reflux disease)   . Pulmonary vein stenosis   . Angina   . Chronic kidney disease   . Anxiety   . Myocardial infarction 1970's; 2010  . Aspiration pneumonia     History of aspiration pneumonia with vent-dependent respiratory/notes 06/12/2008 (12/26/2012)  . Orthopnoea     "just recently" (12/26/2012)  . History of stomach ulcers     "long time ago" (12/26/2012)  . Seizures     "I've had them all my  life; last one was 04/2008" (12/26/2012)  . Chronic lower back pain   . Depression     "used to have problems w/depression" (12/26/2012)  . Fall at home 12/25/2012    "stumbled over cord while I was blowing leaves; broke my right hip" (12/26/2012)    Past Surgical History  Procedure Laterality Date  . Coronary artery bypass graft  04/2008    "CABG X3" (12/26/2012)  . Inguinal hernia repair Right   . Cataract extraction w/ intraocular lens  implant, bilateral Bilateral ~2004    Jarold Motto MS, RD, LDN Pager: [phone] After-hours pager: [phone]

## 2012-12-26 NOTE — Op Note (Signed)
12/25/2012 - 12/26/2012  5:23 PM  PATIENT:  [name]   MRN: [id number]  PRE-OPERATIVE DIAGNOSIS:  Right Hip Fracture  POST-OPERATIVE DIAGNOSIS:  * No post-op diagnosis entered *  PROCEDURE:  Procedure(s): Right Hip Hemiarthroplasty  PREOPERATIVE INDICATIONS:  BLUFORD Ayers is an 79 y.o. male who was admitted 12/25/2012 with a diagnosis of Hip fracture and elected for surgical management.  The risks benefits and alternatives were discussed with the patient including but not limited to the risks of nonoperative treatment, versus surgical intervention including infection, bleeding, nerve injury, periprosthetic fracture, the need for revision surgery, dislocation, leg length discrepancy, blood clots, cardiopulmonary complications, morbidity, mortality, among others, and they were willing to proceed.  Predicted outcome is good, although there will be at least a six to nine month expected recovery.   OPERATIVE REPORT     SURGEON:  Margarita Rana, MD    ASSISTANT:  None    ANESTHESIA:  General    COMPLICATIONS:  None.      COMPONENTS:  [employer] Acolade: Femoral stem: 4, Femoral Head:51, Neck:+4   PROCEDURE IN DETAIL: The patient was met in the holding area and identified.  The appropriate hip  was marked at the operative site. The patient was then transported to the OR and  placed under general anesthesia.  At that point, the patient was  placed in the lateral decubitus position with the operative side up and  secured to the operating room table and all bony prominences padded.     The operative lower extremity was prepped from the iliac crest to the toes.  Sterile draping was performed.  Time out was performed prior to incision.      A routine posterolateral approach was utilized via sharp dissection  carried down to the subcutaneous tissue.  Gross bleeders were Bovie  coagulated.  The iliotibial band was identified and incised  along the length of the skin incision.   Self-retaining retractors were  inserted.  With the hip internally rotated, the short external rotators  were identified. The piriformis was tagged with FiberWire, and the hip capsule released in a T-type fashion.  The femoral neck was exposed, and I resected the femoral neck using the appropriate jig. This was performed at approximately a thumb's breadth above the lesser trochanter.    I then exposed the deep acetabulum, cleared out any tissue including the ligamentum teres, and included the hip capsule in the FiberWire used above and below the T.    I then prepared the proximal femur using the cookie-cutter, the lateralizing reamer, and then sequentially broached.  A trial utilized, and I reduced the hip and it was found to have excellent stability with functional range of motion. The trial components were then removed.   The canal and acetabulum were thoroughly irrigated  I inserted the pressfit stem and placed the head and neck collar. The hip was reduced with appropriate force and was stable through a range of motion.   I then used a 2 mm drill bits to pass the FiberWire suture from the capsule and puriform is through the greater trochanter, and secured this. Excellent posterior capsular repair was achieved. I also closed the T in the capsule.  I then irrigated the hip copiously again with pulse lavage, and repaired the fascia with Vicryl, followed by Vicryl for the subcutaneous tissue, Monocryl for the skin, Steri-Strips and sterile gauze. The wounds were injected. The patient was then awakened and returned to PACU in stable and satisfactory  condition. There were no complications.  POST-OP PLAN: Weight bearing as tolerated. DVT px will consist of SCD's and ASA 325,   Margarita Rana, MD Orthopedic Surgeon [phone]   12/26/2012 5:23 PM

## 2012-12-26 NOTE — Anesthesia Preprocedure Evaluation (Addendum)
Anesthesia Evaluation  Patient identified by MRN, date of birth, ID band Patient awake    Reviewed: Allergy & Precautions, H&P , NPO status , Patient's Chart, lab work & pertinent test results, reviewed documented beta blocker date and time   Airway Mallampati: I TM Distance: >3 FB Neck ROM: full    Dental   Pulmonary shortness of breath and with exertion, pneumonia -, resolved,          Cardiovascular hypertension, Pt. on medications and Pt. on home beta blockers + angina with exertion + CAD, + Past MI, + CABG and + Orthopnea     Neuro/Psych Seizures -, Well Controlled,  Anxiety Depression    GI/Hepatic GERD-  Controlled,  Endo/Other  diabetes, Well Controlled, Type 1, Oral Hypoglycemic Agents and Insulin Dependent  Renal/GU CRFRenal disease     Musculoskeletal   Abdominal   Peds  Hematology  (+) anemia ,   Anesthesia Other Findings   Reproductive/Obstetrics                          Anesthesia Physical Anesthesia Plan  ASA: IV  Anesthesia Plan: General   Post-op Pain Management:    Induction: Intravenous  Airway Management Planned: Oral ETT  Additional Equipment:   Intra-op Plan:   Post-operative Plan: Extubation in OR  Informed Consent: I have reviewed the patients History and Physical, chart, labs and discussed the procedure including the risks, benefits and alternatives for the proposed anesthesia with the patient or authorized representative who has indicated his/her understanding and acceptance.     Plan Discussed with: CRNA, Anesthesiologist and Surgeon  Anesthesia Plan Comments:         Anesthesia Quick Evaluation

## 2012-12-26 NOTE — H&P (View-Only) (Signed)
ORTHOPAEDIC CONSULTATION  REQUESTING PHYSICIAN: Celene Kras, MD  Chief Complaint: Right femoral neck fracture  HPI: [name] is a 79 y.o. male who complains of tripped blowing leaves  Past Medical History  Diagnosis Date  . CAD (coronary artery disease)     s/p cabg 3/10...s/p MI age 3  . Seizure disorder   . Diabetes mellitus type II   . HTN (hypertension)   . HLD (hyperlipidemia)   . Anxiety disorder   . Tremor   . GERD (gastroesophageal reflux disease)   . Pulmonary vein stenosis   . Seizures   . Angina   . Chronic kidney disease   . Anxiety   . Myocardial infarction    Past Surgical History  Procedure Laterality Date  . Coronary artery bypass graft  3/10  . No past surgeries     History   Social History  . Marital Status: Widowed    Spouse Name: N/A    Number of Children: N/A  . Years of Education: N/A   Occupational History  . retired     Engineer, maintenance   Social History Main Topics  . Smoking status: Never Smoker   . Smokeless tobacco: Former Neurosurgeon  . Alcohol Use: 0.0 oz/week     Comment: remote alcohol history but has not drank any for many years.  . Drug Use: None  . Sexual Activity: None   Other Topics Concern  . None   Social History Narrative  . None   Family History  Problem Relation Age of Onset  . Coronary artery disease    . Hypertension     No Known Allergies Prior to Admission medications   Medication Sig Start Date End Date Taking? Authorizing Provider  amLODipine (NORVASC) 10 MG tablet Take 10 mg by mouth daily.    Yes Historical Provider, MD  carbamazepine (EPITOL) 200 MG tablet Take 400 mg by mouth at bedtime.   Yes Historical Provider, MD  carvedilol (COREG) 25 MG tablet Take 25 mg by mouth 2 (two) times daily with a meal.    Yes Historical Provider, MD  clopidogrel (PLAVIX) 75 MG tablet Take 1 tablet (75 mg total) by mouth daily with breakfast. 05/20/12  Yes Belkys A Regalado, MD    Exenatide ER 2 MG PEN Inject 2 mg into the skin See admin instructions. Take on mondays nights   Yes Historical Provider, MD  glyBURIDE (DIABETA) 5 MG tablet Take 5 mg by mouth daily with breakfast.   Yes Historical Provider, MD  insulin glargine (LANTUS) 100 UNIT/ML injection Inject 30 Units into the skin at bedtime.    Yes Historical Provider, MD  mirtazapine (REMERON SOL-TAB) 15 MG disintegrating tablet Take 15 mg by mouth at bedtime.   Yes Historical Provider, MD  pantoprazole (PROTONIX) 40 MG tablet Take 40 mg by mouth daily.    Yes Historical Provider, MD  phenytoin (DILANTIN) 100 MG ER capsule Take 300 mg by mouth at bedtime.   Yes Historical Provider, MD  phenytoin (DILANTIN) 30 MG ER capsule Take 30 mg by mouth at bedtime.   Yes Historical Provider, MD  simvastatin (ZOCOR) 40 MG tablet Take 40 mg by mouth at bedtime.    Yes Historical Provider, MD  Tamsulosin HCl (FLOMAX) 0.4 MG CAPS Take 0.4 mg by mouth daily.   Yes Historical Provider, MD   Dg Hip Complete Right  12/25/2012   CLINICAL DATA:  Fall, hip pain.  EXAM: RIGHT HIP -  COMPLETE 2+ VIEW  COMPARISON:  None.  FINDINGS: There is a right femoral neck fracture. Minimal displacement. No significant angulation. No subluxation or dislocation.  IMPRESSION: Right femoral neck fracture with minimal displacement   Electronically Signed   By: Charlett Nose M.D.   On: 12/25/2012 19:56    Positive ROS: All other systems have been reviewed and were otherwise negative with the exception of those mentioned in the HPI and as above.  Labs cbc  Recent Labs  12/25/12 1815  WBC 12.0*  HGB 12.8*  HCT 37.1*  PLT 244    Labs inflam No results found for this basename: ESR, CRP,  in the last 72 hours  Labs coag  Recent Labs  12/25/12 1837  INR 1.01     Recent Labs  12/25/12 1815  NA 135  K 5.0  CL 103  CO2 21  GLUCOSE 229*  BUN 26*  CREATININE 1.25  CALCIUM 8.9    Physical Exam: Filed Vitals:   12/25/12 2100  BP: 152/79   Pulse: 84  Temp:   Resp:    General: Alert, no acute distress Cardiovascular: No pedal edema Respiratory: No cyanosis, no use of accessory musculature GI: No organomegaly, abdomen is soft and non-tender Skin: No lesions in the area of chief complaint Neurologic: Sensation intact distally Psychiatric: Patient is competent for consent with normal mood and affect Lymphatic: No axillary or cervical lymphadenopathy  MUSCULOSKELETAL:  RLE: Pain with ROM, SKin Benign, Distally NVI Other extremities are atraumatic with painless ROM and NVI.  Assessment: R fem neck fracute  Plan: R hemiarthroplasty tuesday Weight Bearing Status: bedrest for now PT VTE px: SCD's and restart chemical px post op   Margarita Rana, D, MD Cell [phone]   12/25/2012 9:17 PM

## 2012-12-26 NOTE — Anesthesia Postprocedure Evaluation (Signed)
  Anesthesia Post-op Note  Patient: [name]  Procedure(s) Performed: Procedure(s): Right Hip Hemiarthroplasty (Right)  Patient Location: PACU  Anesthesia Type:General  Level of Consciousness: awake, alert , oriented and patient cooperative  Airway and Oxygen Therapy: Patient Spontanous Breathing  Post-op Pain: mild  Post-op Assessment: Post-op Vital signs reviewed, Patient's Cardiovascular Status Stable, Respiratory Function Stable, Patent Airway, No signs of Nausea or vomiting and Pain level controlled  Post-op Vital Signs: stable  Complications: No apparent anesthesia complications

## 2012-12-26 NOTE — Anesthesia Procedure Notes (Signed)
Procedure Name: Intubation Date/Time: 12/26/2012 5:25 PM Performed by: Ellin Goodie Pre-anesthesia Checklist: Patient identified, Emergency Drugs available, Suction available, Patient being monitored and Timeout performed Patient Re-evaluated:Patient Re-evaluated prior to inductionOxygen Delivery Method: Circle system utilized Preoxygenation: Pre-oxygenation with 100% oxygen Intubation Type: IV induction Ventilation: Mask ventilation without difficulty Laryngoscope Size: Mac and 3 Grade View: Grade I Tube type: Oral Tube size: 7.5 mm Number of attempts: 1 Airway Equipment and Method: Stylet Placement Confirmation: ETT inserted through vocal cords under direct vision,  positive ETCO2 and breath sounds checked- equal and bilateral Secured at: 22 cm Tube secured with: Tape Dental Injury: Teeth and Oropharynx as per pre-operative assessment  Comments: Easy atraumatic induction and intubation with MAC 3 blade.  Placement verified by Dr. Katrinka Blazing.  Carlynn Herald. CRNA

## 2012-12-26 NOTE — Interval H&P Note (Signed)
History and Physical Interval Note:  12/26/2012 8:41 AM  [name]  has presented today for surgery, with the diagnosis of Right Hip Fracture  The various methods of treatment have been discussed with the patient and family. After consideration of risks, benefits and other options for treatment, the patient has consented to  Procedure(s): Right Hip Hemiarthroplasty (Right) as a surgical intervention .  The patient's history has been reviewed, patient examined, no change in status, stable for surgery.  I have reviewed the patient's chart and labs.  Questions were answered to the patient's satisfaction.     Latha Staunton, D

## 2012-12-26 NOTE — Progress Notes (Signed)
[facility] PROGRESS NOTE  WESTIN KNOTTS ZOX:[id number] DOB: 21-Sep-1928 DOA: 12/25/2012 PCP: [name], MD  Assessment/Plan: 1-Right hip fracture - from medical standpoint of view patient has medically stable for surgery. Moderate risk for procedure.  2- Diabetes mellitus type 2 - Continue with Lantus and SSI. Hold glipizide while in the hospital.  3-CAD status post CABG - denies any chest pain. Hold Plavix for now due to surgery.  4-Seizure disorder - continue home medications. Dilantin 12.8 and carbamazepine 5.6 5-Hypertension - continue home medications.  6-Hyperlipidemia - continue home medications. 7-Leukocytosis: afebrile, follow trend. UA pending. Chest x ray negative.  8-Fall; ct head negative.    Code Status: Full Code.  Family Communication: Care discussed with patient. Disposition Plan: to be determine.    Consultants:  Dr Eulah Pont  Procedures:  none  Antibiotics:  none  HPI/Subjective: He denies chest pain or dyspnea on exertion. He is breathing well.   Objective: Filed Vitals:   12/26/12 0615  BP: 156/85  Pulse: 68  Temp: 98.2 F (36.8 C)  Resp: 18    Intake/Output Summary (Last 24 hours) at 12/26/12 0931 Last data filed at 12/26/12 0857  Gross per 24 hour  Intake      0 ml  Output    450 ml  Net   -450 ml   There were no vitals filed for this visit.  Exam:   General:  NAD  Cardiovascular: S 1, S 2 RRR  Respiratory: CTA  Abdomen: BS present, soft, NT  Musculoskeletal: no edema.   Data Reviewed: Basic Metabolic Panel:  Recent Labs Lab 12/25/12 1815 12/26/12 0525  NA 135 135  K 5.0 4.5  CL 103 105  CO2 21 20  GLUCOSE 229* 193*  BUN 26* 23  CREATININE 1.25 1.23  CALCIUM 8.9 8.3*   Liver Function Tests:  Recent Labs Lab 12/25/12 1815  AST 23  ALT 22  ALKPHOS 123*  BILITOT 0.2*  PROT 7.3  ALBUMIN 3.5   No results found for this basename: LIPASE, AMYLASE,  in the last 168 hours No results found for  this basename: AMMONIA,  in the last 168 hours CBC:  Recent Labs Lab 12/25/12 1815 12/26/12 0525  WBC 12.0* 14.9*  NEUTROABS 8.5*  --   HGB 12.8* 11.5*  HCT 37.1* 32.6*  MCV 91.8 90.8  PLT 244 221   Cardiac Enzymes: No results found for this basename: CKTOTAL, CKMB, CKMBINDEX, TROPONINI,  in the last 168 hours BNP (last 3 results) No results found for this basename: PROBNP,  in the last 8760 hours CBG:  Recent Labs Lab 12/25/12 2206 12/26/12 0651  GLUCAP 176* 181*    Recent Results (from the past 240 hour(s))  SURGICAL PCR SCREEN     Status: None   Collection Time    12/26/12  4:40 AM      Result Value Range Status   MRSA, PCR NEGATIVE  NEGATIVE Final   Staphylococcus aureus NEGATIVE  NEGATIVE Final   Comment:            The Xpert SA Assay (FDA     approved for NASAL specimens     in patients over 36 years of age),     is one component of     a comprehensive surveillance     program.  Test performance has     been validated by [employer] for patients greater     than or equal to 18 year old.  It is not intended     to diagnose infection nor to     guide or monitor treatment.     Studies: Dg Hip Complete Right  12/25/2012   CLINICAL DATA:  Fall, hip pain.  EXAM: RIGHT HIP - COMPLETE 2+ VIEW  COMPARISON:  None.  FINDINGS: There is a right femoral neck fracture. Minimal displacement. No significant angulation. No subluxation or dislocation.  IMPRESSION: Right femoral neck fracture with minimal displacement   Electronically Signed   By: Charlett Nose M.D.   On: 12/25/2012 19:56   Dg Chest Port 1 View  12/25/2012   CLINICAL DATA:  Pre hip surgery evaluation.  EXAM: PORTABLE CHEST - 1 VIEW  COMPARISON:  08/26/2011.  FINDINGS: Normal sized heart. Clear lungs with normal vascularity. Post CABG changes. Unremarkable bones.  IMPRESSION: No acute abnormality.   Electronically Signed   By: Gordan Payment M.D.   On: 12/25/2012 22:57    Scheduled Meds: . amLODipine   10 mg Oral Daily  . carbamazepine  400 mg Oral QHS  . carvedilol  25 mg Oral BID WC  .  ceFAZolin (ANCEF) IV  2 g Intravenous On Call to OR  . chlorhexidine  60 mL Topical Once  . [START ON 01/01/2013] Exenatide ER  2 mg Subcutaneous Q Mon-1800  . glyBURIDE  5 mg Oral Q breakfast  . influenza vac split quadrivalent PF  0.5 mL Intramuscular Once  . insulin aspart  0-9 Units Subcutaneous TID WC  . insulin glargine  20 Units Subcutaneous QHS  . mirtazapine  15 mg Oral QHS  . pantoprazole  40 mg Oral Daily  . phenytoin  330 mg Oral QHS  . simvastatin  40 mg Oral QHS  . tamsulosin  0.4 mg Oral Daily   Continuous Infusions: . sodium chloride 75 mL/hr at 12/26/12 2130    Principal Problem:   Hip fracture Active Problems:   HYPERTENSION, BENIGN   CAD, AUTOLOGOUS BYPASS GRAFT   SEIZURE DISORDER   Diabetes mellitus    Time spent: 35 minutes.     Prynce Jacober  [facility] Pager [phone]. If 7PM-7AM, please contact night-coverage at [email or web address], password [facility] 12/26/2012, 9:31 AM  LOS: 1 day

## 2012-12-26 NOTE — Progress Notes (Signed)
Patient has 4 pieces of jewelry in the safe with security in ED Pod C.  Patient has 3 gold tone rings and 1 gold tone necklace.  Please see yellow form in patient's chart.  No key was provided.  Patient/family will have to go to security to retrieve items.  Patient wanted his cell phone to remain with him in the room.  No other valuables remain in the room.

## 2012-12-27 LAB — BASIC METABOLIC PANEL
BUN: 21 mg/dL (ref 6–23)
BUN: 31 mg/dL — ABNORMAL HIGH (ref 6–23)
CO2: 20 mEq/L (ref 19–32)
Calcium: 8 mg/dL — ABNORMAL LOW (ref 8.4–10.5)
Chloride: 101 mEq/L (ref 96–112)
Creatinine, Ser: 1.31 mg/dL (ref 0.50–1.35)
GFR calc Af Amer: 40 mL/min — ABNORMAL LOW (ref >90)
GFR calc non Af Amer: 35 mL/min — ABNORMAL LOW (ref >90)
Glucose, Bld: 236 mg/dL — ABNORMAL HIGH (ref 70–99)
Glucose, Bld: 364 mg/dL — ABNORMAL HIGH (ref 70–99)
Potassium: 4.6 mEq/L (ref 3.5–5.1)
Potassium: 5.7 mEq/L — ABNORMAL HIGH (ref 3.5–5.1)
Sodium: 129 mEq/L — ABNORMAL LOW (ref 135–145)

## 2012-12-27 LAB — CBC
HCT: 31.8 % — ABNORMAL LOW (ref 39.0–52.0)
Hemoglobin: 10.7 g/dL — ABNORMAL LOW (ref 13.0–17.0)
MCHC: 33.6 g/dL (ref 30.0–36.0)
MCV: 93.8 fL (ref 78.0–100.0)
Platelets: 225 10*3/uL (ref 150–400)
WBC: 14.9 10*3/uL — ABNORMAL HIGH (ref 4.0–10.5)

## 2012-12-27 LAB — GLUCOSE, CAPILLARY
Glucose-Capillary: 298 mg/dL — ABNORMAL HIGH (ref 70–99)
Glucose-Capillary: 292 mg/dL — ABNORMAL HIGH (ref 70–99)

## 2012-12-27 LAB — VITAMIN D 25 HYDROXY (VIT D DEFICIENCY, FRACTURES): Vit D, 25-Hydroxy: 35 ng/mL (ref 30–89)

## 2012-12-27 MED ORDER — MORPHINE SULFATE 2 MG/ML IJ SOLN: 0.5000 mg | INTRAMUSCULAR | Status: DC | PRN

## 2012-12-27 MED ORDER — SODIUM CHLORIDE 0.9 % IV SOLN
INTRAVENOUS | Status: DC
  Administered 2012-12-28 (×2): via INTRAVENOUS
  Administered 2012-12-28: 1 mL via INTRAVENOUS
  Administered 2012-12-29: 06:00:00 via INTRAVENOUS

## 2012-12-27 MED ORDER — POLYETHYLENE GLYCOL 3350 17 G PO PACK
17.0000 g | PACK | Freq: Every day | ORAL | Status: DC
  Administered 2012-12-27: 17 g via ORAL
  Filled 2012-12-27 (×4): qty 1

## 2012-12-27 MED ORDER — ASPIRIN 325 MG PO TABS
325.0000 mg | ORAL_TABLET | Freq: Every day | ORAL | Status: DC
  Administered 2012-12-27 – 2012-12-29 (×3): 325 mg via ORAL
  Filled 2012-12-27 (×4): qty 1

## 2012-12-27 MED ORDER — INSULIN GLARGINE 100 UNIT/ML ~~LOC~~ SOLN
25.0000 [IU] | Freq: Every day | SUBCUTANEOUS | Status: DC
  Administered 2012-12-27 – 2012-12-28 (×2): 25 [IU] via SUBCUTANEOUS
  Filled 2012-12-27 (×3): qty 0.25

## 2012-12-27 MED ORDER — SENNOSIDES-DOCUSATE SODIUM 8.6-50 MG PO TABS
1.0000 | ORAL_TABLET | Freq: Two times a day (BID) | ORAL | Status: DC
  Administered 2012-12-27 – 2012-12-29 (×4): 1 via ORAL
  Filled 2012-12-27 (×5): qty 1

## 2012-12-27 MED ORDER — SODIUM POLYSTYRENE SULFONATE 15 GM/60ML PO SUSP
15.0000 g | Freq: Once | ORAL | Status: AC
  Administered 2012-12-27: 15 g via ORAL
  Filled 2012-12-27: qty 60

## 2012-12-27 NOTE — Progress Notes (Signed)
[facility] PROGRESS NOTE  [name]:[id number] DOB: 02-08-1929 DOA: 12/25/2012 PCP: [name], MD  Assessment/Plan: 1-Right hip fracture - Patient S/P Right Hip Hemiarthroplasty on 11-11. PT, OT per ortho. Probably need SNF. Bowel regimen ordered. DVT prophylaxis aspirin per Dr Eulah Pont notes. Will order aspirin.   2- Diabetes mellitus type 2 - Increase Lantus to 25 units. SSI. Hold glipizide while in the hospital.   3-CAD status post CABG - denies any chest pain. Resume plavix when ok with Dr Eulah Pont.   4-Seizure disorder - continue home medications. Dilantin 12.8 and carbamazepine 5.6  5-Hypertension - continue home medications.   6-Hyperlipidemia - continue home medications.  7-Leukocytosis: afebrile, follow trend. UA not send. Chest x ray negative.   8-Fall; ct head negative.   9-Metabolic acidosis: IV fluids. Repeat B-met today.    Code Status: Full Code.  Family Communication: Care discussed with patient. Disposition Plan: to be determine.    Consultants:  Dr Eulah Pont  Procedures:  none  Antibiotics:  none  HPI/Subjective: Feeling well. Agree to go to rehab. No chest pain or dyspnea.   Objective: Filed Vitals:   12/27/12 1345  BP: 111/65  Pulse: 84  Temp: 98.2 F (36.8 C)  Resp: 19    Intake/Output Summary (Last 24 hours) at 12/27/12 1429 Last data filed at 12/27/12 0900  Gross per 24 hour  Intake    340 ml  Output      0 ml  Net    340 ml   There were no vitals filed for this visit.  Exam:   General:  NAD  Cardiovascular: S 1, S 2 RRR  Respiratory: CTA  Abdomen: BS present, soft, NT  Musculoskeletal: no edema.   Data Reviewed: Basic Metabolic Panel:  Recent Labs Lab 12/25/12 1815 12/26/12 0525 12/27/12 0507  NA 135 135 135  K 5.0 4.5 4.6  CL 103 105 101  CO2 21 20 14*  GLUCOSE 229* 193* 236*  BUN 26* 23 21  CREATININE 1.25 1.23 1.31  CALCIUM 8.9 8.3* 8.4   Liver Function Tests:  Recent  Labs Lab 12/25/12 1815  AST 23  ALT 22  ALKPHOS 123*  BILITOT 0.2*  PROT 7.3  ALBUMIN 3.5   No results found for this basename: LIPASE, AMYLASE,  in the last 168 hours No results found for this basename: AMMONIA,  in the last 168 hours CBC:  Recent Labs Lab 12/25/12 1815 12/26/12 0525 12/27/12 0507  WBC 12.0* 14.9* 14.9*  NEUTROABS 8.5*  --   --   HGB 12.8* 11.5* 10.7*  HCT 37.1* 32.6* 31.8*  MCV 91.8 90.8 93.8  PLT 244 221 225   Cardiac Enzymes: No results found for this basename: CKTOTAL, CKMB, CKMBINDEX, TROPONINI,  in the last 168 hours BNP (last 3 results) No results found for this basename: PROBNP,  in the last 8760 hours CBG:  Recent Labs Lab 12/26/12 1541 12/26/12 1916 12/26/12 2156 12/27/12 0644 12/27/12 1117  GLUCAP 107* 150* 185* 244* 292*    Recent Results (from the past 240 hour(s))  SURGICAL PCR SCREEN     Status: None   Collection Time    12/26/12  4:40 AM      Result Value Range Status   MRSA, PCR NEGATIVE  NEGATIVE Final   Staphylococcus aureus NEGATIVE  NEGATIVE Final   Comment:            The Xpert SA Assay (FDA     approved for NASAL specimens  in patients over 77 years of age),     is one component of     a comprehensive surveillance     program.  Test performance has     been validated by [employer] for patients greater     than or equal to 77 year old.     It is not intended     to diagnose infection nor to     guide or monitor treatment.     Studies: Dg Hip Complete Right  12/25/2012   CLINICAL DATA:  Fall, hip pain.  EXAM: RIGHT HIP - COMPLETE 2+ VIEW  COMPARISON:  None.  FINDINGS: There is a right femoral neck fracture. Minimal displacement. No significant angulation. No subluxation or dislocation.  IMPRESSION: Right femoral neck fracture with minimal displacement   Electronically Signed   By: Charlett Nose M.D.   On: 12/25/2012 19:56   Ct Head Wo Contrast  12/26/2012   CLINICAL DATA:  Status post fall. Blow to  the head.  EXAM: CT HEAD WITHOUT CONTRAST  TECHNIQUE: Contiguous axial images were obtained from the base of the skull through the vertex without intravenous contrast.  COMPARISON:  Head CT scan brain MRI 05/18/2012.  FINDINGS: There is some cortical atrophy. No evidence of acute abnormality including infarction, hemorrhage, mass lesion, mass effect, midline shift or abnormal extra-axial fluid collection is identified. No hydrocephalus or pneumocephalus. The calvarium is intact.  IMPRESSION: No acute finding.   Electronically Signed   By: Drusilla Kanner M.D.   On: 12/26/2012 10:27   Dg Pelvis Portable  12/27/2012   CLINICAL DATA:  Status post right hip replacement.  EXAM: PORTABLE PELVIS 1-2 VIEWS  COMPARISON:  Abdomen radiographs dated 02/26/2011.  FINDINGS: Right hip prosthesis in satisfactory position and alignment. No fracture or dislocation seen.  IMPRESSION: Satisfactory postoperative appearance of a right hip prosthesis.   Electronically Signed   By: Gordan Payment M.D.   On: 12/27/2012 02:39   Dg Chest Port 1 View  12/25/2012   CLINICAL DATA:  Pre hip surgery evaluation.  EXAM: PORTABLE CHEST - 1 VIEW  COMPARISON:  08/26/2011.  FINDINGS: Normal sized heart. Clear lungs with normal vascularity. Post CABG changes. Unremarkable bones.  IMPRESSION: No acute abnormality.   Electronically Signed   By: Gordan Payment M.D.   On: 12/25/2012 22:57    Scheduled Meds: . amLODipine  10 mg Oral Daily  . carbamazepine  400 mg Oral QHS  . carvedilol  25 mg Oral BID WC  . [START ON 01/01/2013] Exenatide ER  2 mg Subcutaneous Q Mon-1800  . feeding supplement (GLUCERNA SHAKE)  237 mL Oral Q24H  . influenza vac split quadrivalent PF  0.5 mL Intramuscular Once  . insulin aspart  0-9 Units Subcutaneous TID WC  . insulin glargine  20 Units Subcutaneous QHS  . mirtazapine  15 mg Oral QHS  . pantoprazole  40 mg Oral Daily  . phenytoin  330 mg Oral QHS  . polyethylene glycol  17 g Oral Daily  . senna-docusate  1  tablet Oral BID  . simvastatin  40 mg Oral QHS  . tamsulosin  0.4 mg Oral Daily   Continuous Infusions: . sodium chloride      Principal Problem:   Hip fracture Active Problems:   HYPERTENSION, BENIGN   CAD, AUTOLOGOUS BYPASS GRAFT   SEIZURE DISORDER   Diabetes mellitus    Time spent: 35 minutes.     [name]  [facility]  Pager [phone]. If 7PM-7AM, please contact night-coverage at [email or web address], password [facility] 12/27/2012, 2:29 PM  LOS: 2 days

## 2012-12-27 NOTE — Evaluation (Signed)
Physical Therapy Evaluation Patient Details Name: RANDELL TEARE MRN: [id number] DOB: Jun 22, 1928 Today's Date: 12/27/2012 Time: 8295-6213 PT Time Calculation (min): 21 min  PT Assessment / Plan / Recommendation History of Present Illness  Pt is an 79 y/o male admitted s/p R hip hemiarthroplasty.  Clinical Impression  This patient presents with acute pain and decreased functional independence following the above mentioned procedure. At the time of PT eval, pt required min assist for transfers and ambulation. He and wife are planning for d/c to SNF prior to returning home. This patient is appropriate for skilled PT interventions to address functional limitations, improve safety and independence with functional mobility, and return to PLOF.     PT Assessment  Patient needs continued PT services    Follow Up Recommendations  SNF    Does the patient have the potential to tolerate intense rehabilitation      Barriers to Discharge        Equipment Recommendations  None recommended by PT    Recommendations for Other Services     Frequency 7X/week    Precautions / Restrictions Precautions Precautions: Fall;Posterior Hip Restrictions Weight Bearing Restrictions: Yes RLE Weight Bearing: Weight bearing as tolerated   Pertinent Vitals/Pain 7/10 pain at rest. Pt does not seem to be significantly limited by pain throughout session, and states the next time his pain medicine is due.      Mobility  Bed Mobility Bed Mobility: Supine to Sit;Sitting - Scoot to Edge of Bed Supine to Sit: 4: Min assist;HOB elevated;With rails Sitting - Scoot to Edge of Bed: 4: Min guard Details for Bed Mobility Assistance: VC's for sequencing and technique to transition to EOB Transfers Transfers: Sit to Stand;Stand to Sit Sit to Stand: 4: Min assist;From bed;With upper extremity assist Stand to Sit: 4: Min assist;To chair/3-in-1;With upper extremity assist Details for Transfer Assistance: VC's for  hand placement on seated surface, and assist to come to full stand. Pt cued to maintain hip precautions as well when initiating sit>stand. Ambulation/Gait Ambulation/Gait Assistance: 4: Min assist Ambulation Distance (Feet): 5 Feet Assistive device: Rolling walker Ambulation/Gait Assistance Details: Min assist as pt had difficulty bearing weight through RLE, and knee buckled. VC's for UE support on walker. Gait Pattern: Step-to pattern;Decreased stride length Gait velocity: Decreased Stairs: No    Exercises Total Joint Exercises Ankle Circles/Pumps: 10 reps Quad Sets: 10 reps   PT Diagnosis: Difficulty walking;Acute pain  PT Problem List: Decreased strength;Decreased range of motion;Decreased activity tolerance;Decreased balance;Decreased mobility;Decreased knowledge of use of DME;Decreased knowledge of precautions;Pain PT Treatment Interventions: DME instruction;Gait training;Stair training;Functional mobility training;Therapeutic activities;Therapeutic exercise;Neuromuscular re-education;Patient/family education     PT Goals(Current goals can be found in the care plan section) Acute Rehab PT Goals Patient Stated Goal: To return home after SNF PT Goal Formulation: With patient/family Time For Goal Achievement: 01/03/13 Potential to Achieve Goals: Good  Visit Information  Last PT Received On: 12/27/12 Assistance Needed: +1 History of Present Illness: Pt is an 79 y/o male admitted s/p R hip hemiarthroplasty.       Prior Functioning  Home Living Family/patient expects to be discharged to:: Skilled nursing facility Living Arrangements: Spouse/significant other Prior Function Level of Independence: Independent with assistive device(s) (Single point cane out in public, nothing around the house) Communication Communication: No difficulties Dominant Hand: Right    Cognition  Cognition Arousal/Alertness: Awake/alert Behavior During Therapy: WFL for tasks  assessed/performed Overall Cognitive Status: Within Functional Limits for tasks assessed    Extremity/Trunk Assessment Upper Extremity  Assessment Upper Extremity Assessment: Overall WFL for tasks assessed Lower Extremity Assessment Lower Extremity Assessment: RLE deficits/detail RLE Deficits / Details: Decreased strength and AROM consistent with R hip hemi RLE: Unable to fully assess due to pain Cervical / Trunk Assessment Cervical / Trunk Assessment: Normal   Balance Balance Balance Assessed: Yes Static Sitting Balance Static Sitting - Balance Support: Feet supported;Bilateral upper extremity supported Static Sitting - Level of Assistance: 5: Stand by assistance Static Sitting - Comment/# of Minutes: 2 minutes Static Standing Balance Static Standing - Balance Support: Bilateral upper extremity supported Static Standing - Level of Assistance: 4: Min assist Static Standing - Comment/# of Minutes: 30 seconds prior to initiation of ambulation  End of Session PT - End of Session Equipment Utilized During Treatment: Gait belt Activity Tolerance: Patient tolerated treatment well Patient left: in chair;with call bell/phone within reach;with family/visitor present Nurse Communication: Mobility status  GP     Ruthann Cancer 12/27/2012, 4:43 PM  Ruthann Cancer, PT, DPT [phone]

## 2012-12-27 NOTE — Progress Notes (Signed)
Orthopedic Tech Progress Note Patient Details:  [name] 05-Dec-1928 [id number]  Patient ID: [name], male   DOB: 31-Dec-1928, 80 y.o.   MRN: [id number]   [name] 12/27/2012, 3:10 PMTrapeze bar

## 2012-12-27 NOTE — Progress Notes (Signed)
Inpatient Diabetes Program Recommendations  AACE/ADA: New Consensus Statement on Inpatient Glycemic Control (2013)  Target Ranges:  Prepandial:   less than 140 mg/dL      Peak postprandial:   less than 180 mg/dL (1-2 hours)      Critically ill patients:  140 - 180 mg/dL  Results for [name], [name] (MRN [id number]) as of 12/27/2012 13:05  Ref. Range 12/26/2012 21:56 12/27/2012 06:44 12/27/2012 11:17  Glucose-Capillary Latest Range: 70-99 mg/dL 829 (H) 562 (H) 130 (H)   Inpatient Diabetes Program Recommendations Insulin - Basal: increase Lantus to home dose 30 units  Thank you  Piedad Climes BSN, RN,CDE Inpatient Diabetes Coordinator [phone] (team pager)

## 2012-12-27 NOTE — Progress Notes (Signed)
Subjective:  Patient reports pain as moderate  Objective:   VITALS:   Filed Vitals:   12/26/12 1945 12/26/12 1959 12/27/12 0633 12/27/12 0957  BP: 134/54  136/60 124/63  Pulse: 73  75   Temp:  97.3 F (36.3 C) 98.4 F (36.9 C)   TempSrc:      Resp: 16  18   SpO2: 98% 98% 98%     Physical Exam  Dressing: C/D/I  Compartments soft  SILT DP/SP/S/S/T, 2+DP, +TA/GS/EHL  LABS  Results for orders placed during the hospital encounter of 12/25/12 (from the past 24 hour(s))  GLUCOSE, CAPILLARY     Status: Abnormal   Collection Time    12/26/12 11:17 AM      Result Value Range   Glucose-Capillary 138 (*) 70 - 99 mg/dL   Comment 1 Documented in Chart     Comment 2 Notify RN    GLUCOSE, CAPILLARY     Status: Abnormal   Collection Time    12/26/12  3:41 PM      Result Value Range   Glucose-Capillary 107 (*) 70 - 99 mg/dL  GLUCOSE, CAPILLARY     Status: Abnormal   Collection Time    12/26/12  7:16 PM      Result Value Range   Glucose-Capillary 150 (*) 70 - 99 mg/dL  GLUCOSE, CAPILLARY     Status: Abnormal   Collection Time    12/26/12  9:56 PM      Result Value Range   Glucose-Capillary 185 (*) 70 - 99 mg/dL  CBC     Status: Abnormal   Collection Time    12/27/12  5:07 AM      Result Value Range   WBC 14.9 (*) 4.0 - 10.5 K/uL   RBC 3.39 (*) 4.22 - 5.81 MIL/uL   Hemoglobin 10.7 (*) 13.0 - 17.0 g/dL   HCT 21.3 (*) 08.6 - 57.8 %   MCV 93.8  78.0 - 100.0 fL   MCH 31.6  26.0 - 34.0 pg   MCHC 33.6  30.0 - 36.0 g/dL   RDW 46.9  62.9 - 52.8 %   Platelets 225  150 - 400 K/uL  BASIC METABOLIC PANEL     Status: Abnormal   Collection Time    12/27/12  5:07 AM      Result Value Range   Sodium 135  135 - 145 mEq/L   Potassium 4.6  3.5 - 5.1 mEq/L   Chloride 101  96 - 112 mEq/L   CO2 14 (*) 19 - 32 mEq/L   Glucose, Bld 236 (*) 70 - 99 mg/dL   BUN 21  6 - 23 mg/dL   Creatinine, Ser 4.13  0.50 - 1.35 mg/dL   Calcium 8.4  8.4 - 24.4 mg/dL   GFR calc non Af Amer 49 (*)  >90 mL/min   GFR calc Af Amer 57 (*) >90 mL/min  GLUCOSE, CAPILLARY     Status: Abnormal   Collection Time    12/27/12  6:44 AM      Result Value Range   Glucose-Capillary 244 (*) 70 - 99 mg/dL     Assessment/Plan: 1 Day Post-Op   Principal Problem:   Hip fracture Active Problems:   HYPERTENSION, BENIGN   CAD, AUTOLOGOUS BYPASS GRAFT   SEIZURE DISORDER   Diabetes mellitus   PLAN: Weight Bearing: WBAT Post hip precautions Dressings: change prior to d/c VTE prophylaxis: SCD's and ASA 325 Dispo: SNF vs AIR  Margarita Rana, D 12/27/2012, 10:06 AM   Margarita Rana, MD Cell [phone]

## 2012-12-28 DIAGNOSIS — N179 Acute kidney failure, unspecified: Secondary | ICD-10-CM

## 2012-12-28 DIAGNOSIS — D649 Anemia, unspecified: Secondary | ICD-10-CM

## 2012-12-28 LAB — CBC
HCT: 23.5 % — ABNORMAL LOW (ref 39.0–52.0)
Hemoglobin: 8 g/dL — ABNORMAL LOW (ref 13.0–17.0)
MCH: 31.1 pg (ref 26.0–34.0)
MCHC: 34 g/dL (ref 30.0–36.0)
Platelets: 137 10*3/uL — ABNORMAL LOW (ref 150–400)
RDW: 12.4 % (ref 11.5–15.5)
WBC: 9.9 10*3/uL (ref 4.0–10.5)

## 2012-12-28 LAB — GLUCOSE, CAPILLARY
Glucose-Capillary: 208 mg/dL — ABNORMAL HIGH (ref 70–99)
Glucose-Capillary: 244 mg/dL — ABNORMAL HIGH (ref 70–99)
Glucose-Capillary: 246 mg/dL — ABNORMAL HIGH (ref 70–99)
Glucose-Capillary: 236 mg/dL — ABNORMAL HIGH (ref 70–99)

## 2012-12-28 LAB — URINALYSIS, ROUTINE W REFLEX MICROSCOPIC
Glucose, UA: NEGATIVE mg/dL
Hgb urine dipstick: NEGATIVE
Leukocytes, UA: NEGATIVE
Protein, ur: NEGATIVE mg/dL
Specific Gravity, Urine: 1.02 (ref 1.005–1.030)
Urobilinogen, UA: 0.2 mg/dL (ref 0.0–1.0)
pH: 5 (ref 5.0–8.0)

## 2012-12-28 LAB — BASIC METABOLIC PANEL
BUN: 34 mg/dL — ABNORMAL HIGH (ref 6–23)
Calcium: 8 mg/dL — ABNORMAL LOW (ref 8.4–10.5)
Creatinine, Ser: 1.68 mg/dL — ABNORMAL HIGH (ref 0.50–1.35)
GFR calc Af Amer: 42 mL/min — ABNORMAL LOW (ref >90)
GFR calc non Af Amer: 36 mL/min — ABNORMAL LOW (ref >90)
Glucose, Bld: 188 mg/dL — ABNORMAL HIGH (ref 70–99)

## 2012-12-28 NOTE — Progress Notes (Signed)
Physical Therapy Treatment Patient Details Name: AMEET SANDY MRN: [id number] DOB: 1928/07/29 Today's Date: 12/28/2012 Time: 4098-1191 PT Time Calculation (min): 21 min  PT Assessment / Plan / Recommendation  History of Present Illness Pt is an 79 y/o male admitted s/p R hip hemiarthroplasty.   PT Comments   Pt progressing towards physical therapy goals. During bed mobility and transfers pt short of breath with supplemental O2 donned, reporting 6/10 on modified dyspnea scale. One major LOB in which pt unexpectedly returned to sitting on the EOB, stating his leg gave out on him. Further ambulation not initiated at this time due to second person not available for chair follow.   Follow Up Recommendations  SNF     Does the patient have the potential to tolerate intense rehabilitation     Barriers to Discharge        Equipment Recommendations  None recommended by PT    Recommendations for Other Services    Frequency 7X/week   Progress towards PT Goals Progress towards PT goals: Progressing toward goals  Plan Current plan remains appropriate    Precautions / Restrictions Precautions Precautions: Fall;Posterior Hip Precaution Comments: Able to recall 0/3 precautions  Restrictions Weight Bearing Restrictions: Yes RLE Weight Bearing: Weight bearing as tolerated   Pertinent Vitals/Pain 6/10 on modified dyspnea scale    Mobility  Bed Mobility Bed Mobility: Supine to Sit;Sitting - Scoot to Edge of Bed Supine to Sit: 4: Min assist;HOB elevated;With rails Sitting - Scoot to Edge of Bed: 4: Min guard Details for Bed Mobility Assistance: Assist for movement of RLE to EOB. Transfers Transfers: Sit to Stand;Stand to Sit;Stand Pivot Transfers Sit to Stand: 3: Mod assist;From elevated surface;With upper extremity assist;From bed Stand to Sit: 4: Min assist;To chair/3-in-1;With upper extremity assist Stand Pivot Transfers: 4: Min guard Details for Transfer Assistance: VC's for hand  placement on seated surface. SPT initiated as pt had one episode of knee buckling where he lost balance and sat backwards onto the bed. Ambulation not initiated at this time due to safety, as second person not available for chair follow. Ambulation/Gait Ambulation/Gait Assistance: Not tested (comment) Stairs: No    Exercises Total Joint Exercises Ankle Circles/Pumps: 10 reps Quad Sets: 10 reps Hip ABduction/ADduction: 10 reps Long Arc Quad: 10 reps   PT Diagnosis:    PT Problem List:   PT Treatment Interventions:     PT Goals (current goals can now be found in the care plan section) Acute Rehab PT Goals Patient Stated Goal: To return home after SNF PT Goal Formulation: With patient/family Time For Goal Achievement: 01/03/13 Potential to Achieve Goals: Good  Visit Information  Last PT Received On: 12/28/12 Assistance Needed: +2 (For safety) History of Present Illness: Pt is an 79 y/o male admitted s/p R hip hemiarthroplasty.    Subjective Data  Subjective: "I guess it's feeling better, but it's sore Patient Stated Goal: To return home after SNF   Cognition  Cognition Arousal/Alertness: Awake/alert Behavior During Therapy: WFL for tasks assessed/performed Overall Cognitive Status: Within Functional Limits for tasks assessed    Balance     End of Session PT - End of Session Equipment Utilized During Treatment: Gait belt Activity Tolerance: Patient tolerated treatment well Patient left: in chair;with call bell/phone within reach Nurse Communication: Mobility status   GP     Ruthann Cancer 12/28/2012, 2:23 PM  Ruthann Cancer, PT, DPT [phone]

## 2012-12-28 NOTE — Progress Notes (Signed)
Pt has bed available at preferred facility [facility]) tomorrow. Pt has received insurance [place] #[id number].    Adin Lariccia, LCSWA [phone]

## 2012-12-28 NOTE — Progress Notes (Signed)
Subjective:  Patient reports pain as moderate  Objective:   VITALS:   Filed Vitals:   12/28/12 0000 12/28/12 0400 12/28/12 0437 12/28/12 0800  BP:   118/92   Pulse:   100   Temp:   98.9 F (37.2 C)   TempSrc:   Oral   Resp: 16 16 16 16   SpO2: 100% 100% 99% 99%    Physical Exam  Dressing: C/D/I  Compartments soft  SILT DP/SP/S/S/T, 2+DP, +TA/GS/EHL  LABS  Results for orders placed during the hospital encounter of 12/25/12 (from the past 24 hour(s))  GLUCOSE, CAPILLARY     Status: Abnormal   Collection Time    12/27/12 11:17 AM      Result Value Range   Glucose-Capillary 292 (*) 70 - 99 mg/dL  GLUCOSE, CAPILLARY     Status: Abnormal   Collection Time    12/27/12  4:33 PM      Result Value Range   Glucose-Capillary 298 (*) 70 - 99 mg/dL   Comment 1 Documented in Chart     Comment 2 Notify RN    BASIC METABOLIC PANEL     Status: Abnormal   Collection Time    12/27/12  6:00 PM      Result Value Range   Sodium 129 (*) 135 - 145 mEq/L   Potassium 5.7 (*) 3.5 - 5.1 mEq/L   Chloride 98  96 - 112 mEq/L   CO2 20  19 - 32 mEq/L   Glucose, Bld 364 (*) 70 - 99 mg/dL   BUN 31 (*) 6 - 23 mg/dL   Creatinine, Ser 4.09 (*) 0.50 - 1.35 mg/dL   Calcium 8.0 (*) 8.4 - 10.5 mg/dL   GFR calc non Af Amer 35 (*) >90 mL/min   GFR calc Af Amer 40 (*) >90 mL/min  GLUCOSE, CAPILLARY     Status: Abnormal   Collection Time    12/27/12  9:29 PM      Result Value Range   Glucose-Capillary 292 (*) 70 - 99 mg/dL  GLUCOSE, CAPILLARY     Status: Abnormal   Collection Time    12/28/12  1:55 AM      Result Value Range   Glucose-Capillary 208 (*) 70 - 99 mg/dL  GLUCOSE, CAPILLARY     Status: Abnormal   Collection Time    12/28/12  4:31 AM      Result Value Range   Glucose-Capillary 172 (*) 70 - 99 mg/dL  BASIC METABOLIC PANEL     Status: Abnormal   Collection Time    12/28/12  5:22 AM      Result Value Range   Sodium 131 (*) 135 - 145 mEq/L   Potassium 4.9  3.5 - 5.1 mEq/L   Chloride 102  96 - 112 mEq/L   CO2 19  19 - 32 mEq/L   Glucose, Bld 188 (*) 70 - 99 mg/dL   BUN 34 (*) 6 - 23 mg/dL   Creatinine, Ser 8.11 (*) 0.50 - 1.35 mg/dL   Calcium 8.0 (*) 8.4 - 10.5 mg/dL   GFR calc non Af Amer 36 (*) >90 mL/min   GFR calc Af Amer 42 (*) >90 mL/min  CBC     Status: Abnormal   Collection Time    12/28/12  5:22 AM      Result Value Range   WBC 9.9  4.0 - 10.5 K/uL   RBC 2.57 (*) 4.22 - 5.81 MIL/uL   Hemoglobin 8.0 (*)  13.0 - 17.0 g/dL   HCT 16.1 (*) 09.6 - 04.5 %   MCV 91.4  78.0 - 100.0 fL   MCH 31.1  26.0 - 34.0 pg   MCHC 34.0  30.0 - 36.0 g/dL   RDW 40.9  81.1 - 91.4 %   Platelets 137 (*) 150 - 400 K/uL  GLUCOSE, CAPILLARY     Status: Abnormal   Collection Time    12/28/12  6:41 AM      Result Value Range   Glucose-Capillary 172 (*) 70 - 99 mg/dL     Assessment/Plan: 2 Days Post-Op   Principal Problem:   Hip fracture Active Problems:   HYPERTENSION, BENIGN   CAD, AUTOLOGOUS BYPASS GRAFT   SEIZURE DISORDER   Diabetes mellitus   PLAN: Weight Bearing: WBAT Post hip precautions Dressings: change prior to d/c VTE prophylaxis: SCD's and ASA 325 Dispo: SNF vs AIR   [name], D 12/28/2012, 10:23 AM   Margarita Rana, MD Cell [phone]

## 2012-12-28 NOTE — Progress Notes (Addendum)
Clinical Social Work Department CLINICAL SOCIAL WORK PLACEMENT NOTE 12/28/2012  Patient:  [name], [name]  Account Number:  [id number] Admit date:  12/25/2012  Clinical Social Worker:  Sharol Harness, Theresia Majors  Date/time:  12/28/2012 11:30 AM  Clinical Social Work is seeking post-discharge placement for this patient at the following level of care:   SKILLED NURSING   (*CSW will update this form in Epic as items are completed)   12/28/2012  Patient/family provided with [facility] Department of Clinical Social Work's list of facilities offering this level of care within the geographic area requested by the patient (or if unable, by the patient's family).  12/28/2012  Patient/family informed of their freedom to choose among providers that offer the needed level of care, that participate in Medicare, Medicaid or managed care program needed by the patient, have an available bed and are willing to accept the patient.  12/28/2012  Patient/family informed of [facility]' ownership interest in [facility], as well as of the fact that they are under no obligation to receive care at this facility.  PASARR submitted to EDS on 12/28/2012 PASARR number received from EDS on 12/28/2012  FL2 transmitted to all facilities in geographic area requested by pt/family on  12/28/2012 FL2 transmitted to all facilities within larger geographic area on   Patient informed that his/her managed care company has contracts with or will negotiate with  certain facilities, including the following:     Patient/family informed of bed offers received:  12/28/2012 Patient chooses bed at [facility] Physician recommends and patient chooses bed at    Patient to be transferred to on   Patient to be transferred to facility by   The following physician request were entered in Epic:   Additional CommentsSharol Harness, LCSWA [phone]

## 2012-12-28 NOTE — Progress Notes (Signed)
Clinical Social Work Department BRIEF PSYCHOSOCIAL ASSESSMENT 12/28/2012  Patient:  [name], [name]     Account Number:  [id number]     Admit date:  12/25/2012  Clinical Social Worker:  Harless Nakayama  Date/Time:  12/28/2012 11:20 AM  Referred by:  Physician  Date Referred:  12/28/2012 Referred for  SNF Placement   Other Referral:   Interview type:  Patient Other interview type:    PSYCHOSOCIAL DATA Living Status:  ALONE Admitted from facility:   Level of care:   Primary support name:  Tamar Lipscomb [phone] Primary support relationship to patient:  CHILD, ADULT Degree of support available:   Pt has supportive family    CURRENT CONCERNS Current Concerns  Post-Acute Placement   Other Concerns:    SOCIAL WORK ASSESSMENT / PLAN CSW aware of PT recommendation for SNF. CSW spoke with pt who was already aware of recommendation. Pt would like to dc to [facility] [place]. CSW explained SNF search process. Pt is agreeable to being faxed out to all of [place] as a back up.   Assessment/plan status:  Psychosocial Support/Ongoing Assessment of Needs Other assessment/ plan:   Information/referral to community resources:   SNF list denied    PATIENT'S/FAMILY'S RESPONSE TO PLAN OF CARE: Pt is agreeable to SNF at [employer], LCSWA [phone]

## 2012-12-28 NOTE — Progress Notes (Signed)
OT Cancellation Note  Patient Details Name: [name] MRN: [id number] DOB: Jun 16, 1928   Cancelled Treatment:    Reason Eval/Treat Not Completed: Other (comment). Pt's plan is SNF, will defer OT eval to that facility. If plan should change or OT eval is needed for SNF placement please call [phone] or [phone].   [name] [phone] 12/28/2012, 7:36 AM

## 2012-12-28 NOTE — Progress Notes (Addendum)
[facility] PROGRESS NOTE  [name] WJX:[id number] DOB: 07/15/1928 DOA: 12/25/2012 PCP: [name], MD  Assessment/Plan: 1-Right hip fracture - Patient S/P Right Hip Hemiarthroplasty on 11-11. PT, OT per ortho. Probably need SNF. Bowel regimen ordered. DVT prophylaxis aspirin per Dr Eulah Pont.   2- Diabetes mellitus type 2 - Increase Lantus to 25 units. SSI. Hold glipizide while in the hospital.   3-CAD status post CABG - denies any chest pain. Resume plavix when ok with Dr Eulah Pont.   4-Seizure disorder - continue home medications. Dilantin 12.8 and carbamazepine 5.6  5-Hypertension - continue home medications.   6-Hyperlipidemia - continue home medications.  7-Leukocytosis: afebrile, follow trend. UA not send. Chest x ray negative. Resolved.   8-Fall; ct head negative.   9-Metabolic acidosis: resolved with IV fluids.  10-Acute blood loss anemia: post surgery expected. Hb today at 8. Repeat in am.  11-Acute renal failure; Cr peak to 1.7. Continue with IV fluids. Avoid hypotension. Hyperkalemia resolved with one dose of kayexalate. Cr has decrease to 1.6. Repeat b-met in am.  12-Hyponatremia; in setting of renal failure. Continue with IV fluids.   Code Status: Full Code.  Family Communication: Care discussed with patient. Disposition Plan: to be determine.    Consultants:  Dr Eulah Pont  Procedures:  none  Antibiotics:  none  HPI/Subjective: Feeling well. Agree to go to rehab. No chest pain or dyspnea.   Objective: Filed Vitals:   12/28/12 1200  BP:   Pulse:   Temp:   Resp: 16    Intake/Output Summary (Last 24 hours) at 12/28/12 1415 Last data filed at 12/28/12 0437  Gross per 24 hour  Intake    240 ml  Output    500 ml  Net   -260 ml   There were no vitals filed for this visit.  Exam:   General:  NAD  Cardiovascular: S 1, S 2 RRR  Respiratory: CTA  Abdomen: BS present, soft, NT  Musculoskeletal: no edema.   Data  Reviewed: Basic Metabolic Panel:  Recent Labs Lab 12/25/12 1815 12/26/12 0525 12/27/12 0507 12/27/12 1800 12/28/12 0522  NA 135 135 135 129* 131*  K 5.0 4.5 4.6 5.7* 4.9  CL 103 105 101 98 102  CO2 21 20 14* 20 19  GLUCOSE 229* 193* 236* 364* 188*  BUN 26* 23 21 31* 34*  CREATININE 1.25 1.23 1.31 1.75* 1.68*  CALCIUM 8.9 8.3* 8.4 8.0* 8.0*   Liver Function Tests:  Recent Labs Lab 12/25/12 1815  AST 23  ALT 22  ALKPHOS 123*  BILITOT 0.2*  PROT 7.3  ALBUMIN 3.5   No results found for this basename: LIPASE, AMYLASE,  in the last 168 hours No results found for this basename: AMMONIA,  in the last 168 hours CBC:  Recent Labs Lab 12/25/12 1815 12/26/12 0525 12/27/12 0507 12/28/12 0522  WBC 12.0* 14.9* 14.9* 9.9  NEUTROABS 8.5*  --   --   --   HGB 12.8* 11.5* 10.7* 8.0*  HCT 37.1* 32.6* 31.8* 23.5*  MCV 91.8 90.8 93.8 91.4  PLT 244 221 225 137*   Cardiac Enzymes: No results found for this basename: CKTOTAL, CKMB, CKMBINDEX, TROPONINI,  in the last 168 hours BNP (last 3 results) No results found for this basename: PROBNP,  in the last 8760 hours CBG:  Recent Labs Lab 12/27/12 2129 12/28/12 0155 12/28/12 0431 12/28/12 0641 12/28/12 1118  GLUCAP 292* 208* 172* 172* 244*    Recent Results (from the past 240 hour(s))  SURGICAL PCR SCREEN     Status: None   Collection Time    12/26/12  4:40 AM      Result Value Range Status   MRSA, PCR NEGATIVE  NEGATIVE Final   Staphylococcus aureus NEGATIVE  NEGATIVE Final   Comment:            The Xpert SA Assay (FDA     approved for NASAL specimens     in patients over 75 years of age),     is one component of     a comprehensive surveillance     program.  Test performance has     been validated by [employer] for patients greater     than or equal to 82 year old.     It is not intended     to diagnose infection nor to     guide or monitor treatment.     Studies: Dg Pelvis Portable  12/27/2012    CLINICAL DATA:  Status post right hip replacement.  EXAM: PORTABLE PELVIS 1-2 VIEWS  COMPARISON:  Abdomen radiographs dated 02/26/2011.  FINDINGS: Right hip prosthesis in satisfactory position and alignment. No fracture or dislocation seen.  IMPRESSION: Satisfactory postoperative appearance of a right hip prosthesis.   Electronically Signed   By: Gordan Payment M.D.   On: 12/27/2012 02:39    Scheduled Meds: . aspirin  325 mg Oral Daily  . carbamazepine  400 mg Oral QHS  . carvedilol  25 mg Oral BID WC  . [START ON 01/01/2013] Exenatide ER  2 mg Subcutaneous Q Mon-1800  . feeding supplement (GLUCERNA SHAKE)  237 mL Oral Q24H  . influenza vac split quadrivalent PF  0.5 mL Intramuscular Once  . insulin aspart  0-9 Units Subcutaneous TID WC  . insulin glargine  25 Units Subcutaneous QHS  . mirtazapine  15 mg Oral QHS  . pantoprazole  40 mg Oral Daily  . phenytoin  330 mg Oral QHS  . polyethylene glycol  17 g Oral Daily  . senna-docusate  1 tablet Oral BID  . simvastatin  40 mg Oral QHS  . tamsulosin  0.4 mg Oral Daily   Continuous Infusions: . sodium chloride 1 mL (12/28/12 1105)    Principal Problem:   Hip fracture Active Problems:   HYPERTENSION, BENIGN   CAD, AUTOLOGOUS BYPASS GRAFT   SEIZURE DISORDER   Diabetes mellitus    Time spent: 35 minutes.     [name]  [facility] Pager [phone]. If 7PM-7AM, please contact night-coverage at [email or web address], password [facility] 12/28/2012, 2:15 PM  LOS: 3 days

## 2012-12-29 DIAGNOSIS — S72009A Fracture of unspecified part of neck of unspecified femur, initial encounter for closed fracture: Secondary | ICD-10-CM

## 2012-12-29 DIAGNOSIS — N179 Acute kidney failure, unspecified: Secondary | ICD-10-CM

## 2012-12-29 DIAGNOSIS — E119 Type 2 diabetes mellitus without complications: Secondary | ICD-10-CM

## 2012-12-29 DIAGNOSIS — I10 Essential (primary) hypertension: Secondary | ICD-10-CM

## 2012-12-29 DIAGNOSIS — I25810 Atherosclerosis of coronary artery bypass graft(s) without angina pectoris: Secondary | ICD-10-CM

## 2012-12-29 LAB — BASIC METABOLIC PANEL
BUN: 31 mg/dL — ABNORMAL HIGH (ref 6–23)
CO2: 21 mEq/L (ref 19–32)
Chloride: 105 mEq/L (ref 96–112)
GFR calc Af Amer: 49 mL/min — ABNORMAL LOW (ref >90)
GFR calc non Af Amer: 43 mL/min — ABNORMAL LOW (ref >90)
Potassium: 4.8 mEq/L (ref 3.5–5.1)
Sodium: 136 mEq/L (ref 135–145)

## 2012-12-29 LAB — TYPE AND SCREEN
ABO/RH(D): AB POS
Antibody Screen: NEGATIVE
Unit division: 0

## 2012-12-29 LAB — CBC
HCT: 21.6 % — ABNORMAL LOW (ref 39.0–52.0)
Hemoglobin: 7.7 g/dL — ABNORMAL LOW (ref 13.0–17.0)
MCHC: 35.6 g/dL (ref 30.0–36.0)
MCV: 90.8 fL (ref 78.0–100.0)
RBC: 2.38 MIL/uL — ABNORMAL LOW (ref 4.22–5.81)
WBC: 8.9 10*3/uL (ref 4.0–10.5)

## 2012-12-29 LAB — GLUCOSE, CAPILLARY
Glucose-Capillary: 185 mg/dL — ABNORMAL HIGH (ref 70–99)
Glucose-Capillary: 247 mg/dL — ABNORMAL HIGH (ref 70–99)

## 2012-12-29 LAB — PREPARE RBC (CROSSMATCH)

## 2012-12-29 MED ORDER — POLYETHYLENE GLYCOL 3350 17 G PO PACK: 17.0000 g | PACK | Freq: Every day | ORAL | Status: DC

## 2012-12-29 MED ORDER — INSULIN GLARGINE 100 UNIT/ML ~~LOC~~ SOLN: 25.0000 [IU] | Freq: Every day | SUBCUTANEOUS | Status: DC

## 2012-12-29 NOTE — Progress Notes (Signed)
Physical Therapy Treatment Patient Details Name: [name] MRN: [id number] DOB: 1928-07-03 Today's Date: 12/29/2012 Time: 4098-1191 PT Time Calculation (min): 25 min  PT Assessment / Plan / Recommendation  History of Present Illness Pt is an 79 y/o male admitted s/p R hip hemiarthroplasty.   PT Comments   Pt able to gait short distance today, still feels like R LE will "give out".  Continues to require assist for all aspects of gait and mobility and will benefit from continued PT services to increase functional independence.  Follow Up Recommendations  SNF     Does the patient have the potential to tolerate intense rehabilitation     Barriers to Discharge        Equipment Recommendations  None recommended by PT    Recommendations for Other Services    Frequency 7X/week   Progress towards PT Goals Progress towards PT goals: Progressing toward goals  Plan Current plan remains appropriate    Precautions / Restrictions Precautions Precautions: Fall;Posterior Hip Precaution Comments: Able to recall 0/3 precautions  Restrictions Weight Bearing Restrictions: Yes RLE Weight Bearing: Weight bearing as tolerated   Pertinent Vitals/Pain No c/o pain, pt states he rec'd meds prior to treatment    Mobility  Bed Mobility Supine to Sit: 4: Min assist;With rails;HOB elevated Sitting - Scoot to Edge of Bed: 4: Min assist Details for Bed Mobility Assistance: assist for R LE Transfers Sit to Stand: 4: Min assist Stand to Sit: 4: Min Multimedia programmer Transfers: 4: Min assist Details for Transfer Assistance: cues for hand placement and moving RW, lifting assist Ambulation/Gait Ambulation/Gait Assistance: 4: Min assist Ambulation Distance (Feet): 5 Feet Assistive device: Rolling walker Ambulation/Gait Assistance Details: min A for moving RW and wt shifting due to R LE weakness, no episode of buckling, pt reports his leg feels weak    Exercises Total Joint Exercises Ankle  Circles/Pumps: AROM;Both;15 reps Quad Sets: AROM;Right;10 reps Short Arc Quad: AROM;15 reps;Right Heel Slides: AROM;Right;15 reps Hip ABduction/ADduction: AROM;Right;15 reps   PT Diagnosis:    PT Problem List:   PT Treatment Interventions:     PT Goals (current goals can now be found in the care plan section)    Visit Information  Last PT Received On: 12/29/12 Assistance Needed: +1 History of Present Illness: Pt is an 79 y/o male admitted s/p R hip hemiarthroplasty.    Subjective Data      Cognition  Cognition Behavior During Therapy: WFL for tasks assessed/performed Overall Cognitive Status: Within Functional Limits for tasks assessed    Balance     End of Session PT - End of Session Equipment Utilized During Treatment: Gait belt Activity Tolerance: Patient tolerated treatment well Patient left: in chair;with call bell/phone within reach   GP     [facility] 12/29/2012, 9:57 AM

## 2012-12-29 NOTE — Discharge Summary (Signed)
Physician Discharge Summary  [name] XBJ:[id number] DOB: 1928-07-25 DOA: 12/25/2012  PCP: [name], MD  Admit date: 12/25/2012 Discharge date: 12/29/2012  Time spent: 35 minutes  Recommendations for Outpatient Follow-up:  1. Adjust insulin as needed.  2. Resume Norvasc when blood pressure increase.  3. Please repeat B-met to follow renal function. Need cbc to follow hb.  4. Need to follow up with Dr Eulah Pont post surgery.   Discharge Diagnoses:    Hip fracture   HYPERTENSION, BENIGN   CAD, AUTOLOGOUS BYPASS GRAFT   SEIZURE DISORDER   Diabetes mellitus   Acute renal failure   Discharge Condition: Stable.   Diet recommendation: Carb modified.   Filed Weights   12/28/12 1442  Weight: 90.266 kg (199 lb)    History of present illness:  [name] is a 79 y.o. male history of CAD status post CABG, seizures, diabetes mellitus, hypertension and hyperlipidemia had a fall at his yard today. Patient did not lose consciousness. He was brought to the ER where x-rays revealed a right hip fracture. On call orthopedic surgeon was consulted and patient has been admitted for further management. Patient denies any chest pain shortness of breath nausea vomiting headache focal deficits abdominal pain or diarrhea.    Hospital Course:  1-Right hip fracture - Patient S/P Right Hip Hemiarthroplasty on 11-11. PT, OT per ortho. Probably need SNF. Bowel regimen ordered. DVT prophylaxis aspirin per Dr Eulah Pont.  2- Diabetes mellitus type 2 - continue with Lantus to 25 units. SSI. Hold glipizide to avoid hypoglycemia in setting of worsening renal function.  3-CAD status post CABG - denies any chest pain. Resume plavix when ok with Dr Eulah Pont.   4-Seizure disorder - continue home medications. Dilantin 12.8 and carbamazepine 5.6  5-Hypertension - hold Norvasc due to SBP in the 100 to 130.  6-Hyperlipidemia - continue home medications.  7-Leukocytosis: afebrile, follow trend. UA not  send. Chest x ray negative. Resolved.  8-Fall; ct head negative.  9-Metabolic acidosis: resolved with IV fluids.  10-Acute blood loss anemia: post surgery expected. Hb today 7.7. Will transfuse one unit prior to transfer. Need repeat hb. No evidence of acute bleed.  11-Acute renal failure; Cr peak to 1.7. Continue with IV fluids. Avoid hypotension. Hyperkalemia resolved with one dose of kayexalate. Cr has decrease to 1.4.  12-Hyponatremia; in setting of renal failure. Resolved with IV fluids.    Procedures: Patient S/P Right Hip Hemiarthroplasty on 11-11   Consultations:  Dr Eulah Pont.   Discharge Exam: Filed Vitals:   12/29/12 0800  BP:   Pulse:   Temp:   Resp: 18    General: No distress.  Cardiovascular: S 1, S RRR Respiratory: CTA  Discharge Instructions  Discharge Orders   Future Orders Complete By Expires   Diet - low sodium heart healthy  As directed    Increase activity slowly  As directed    Posterior total hip precautions  As directed    Weight bearing as tolerated  As directed    Questions:     Laterality:     Extremity:         Medication List    STOP taking these medications       amLODipine 10 MG tablet  Commonly known as:  NORVASC     glyBURIDE 5 MG tablet  Commonly known as:  DIABETA      TAKE these medications       aspirin EC 325 MG tablet  Take 1 tablet (  325 mg total) by mouth daily.     carvedilol 25 MG tablet  Commonly known as:  COREG  Take 25 mg by mouth 2 (two) times daily with a meal.     clopidogrel 75 MG tablet  Commonly known as:  PLAVIX  Take 1 tablet (75 mg total) by mouth daily with breakfast.     EPITOL 200 MG tablet  Generic drug:  carbamazepine  Take 400 mg by mouth at bedtime.     Exenatide ER 2 MG Pen  Inject 2 mg into the skin See admin instructions. Take on mondays nights     HYDROcodone-acetaminophen 5-325 MG per tablet  Commonly known as:  NORCO  Take 2 tablets by mouth every 4 (four) hours as needed.      insulin glargine 100 UNIT/ML injection  Commonly known as:  LANTUS  Inject 0.25 mLs (25 Units total) into the skin at bedtime.     mirtazapine 15 MG disintegrating tablet  Commonly known as:  REMERON SOL-TAB  Take 15 mg by mouth at bedtime.     pantoprazole 40 MG tablet  Commonly known as:  PROTONIX  Take 40 mg by mouth daily.     phenytoin 100 MG ER capsule  Commonly known as:  DILANTIN  Take 300 mg by mouth at bedtime.     phenytoin 30 MG ER capsule  Commonly known as:  DILANTIN  Take 30 mg by mouth at bedtime.     polyethylene glycol packet  Commonly known as:  MIRALAX / GLYCOLAX  Take 17 g by mouth daily.     simvastatin 40 MG tablet  Commonly known as:  ZOCOR  Take 40 mg by mouth at bedtime.     tamsulosin 0.4 MG Caps capsule  Commonly known as:  FLOMAX  Take 0.4 mg by mouth daily.       No Known Allergies     Follow-up Information   Follow up with MURPHY, TIMOTHY, D, MD. Schedule an appointment as soon as possible for a visit in 2 weeks.   Specialty:  Orthopedic Surgery   Contact information:   [address] [place] [address] [phone]        The results of significant diagnostics from this hospitalization (including imaging, microbiology, ancillary and laboratory) are listed below for reference.    Significant Diagnostic Studies: Dg Hip Complete Right  12/25/2012   CLINICAL DATA:  Fall, hip pain.  EXAM: RIGHT HIP - COMPLETE 2+ VIEW  COMPARISON:  None.  FINDINGS: There is a right femoral neck fracture. Minimal displacement. No significant angulation. No subluxation or dislocation.  IMPRESSION: Right femoral neck fracture with minimal displacement   Electronically Signed   By: Charlett Nose M.D.   On: 12/25/2012 19:56   Ct Head Wo Contrast  12/26/2012   CLINICAL DATA:  Status post fall. Blow to the head.  EXAM: CT HEAD WITHOUT CONTRAST  TECHNIQUE: Contiguous axial images were obtained from the base of the skull through the vertex  without intravenous contrast.  COMPARISON:  Head CT scan brain MRI 05/18/2012.  FINDINGS: There is some cortical atrophy. No evidence of acute abnormality including infarction, hemorrhage, mass lesion, mass effect, midline shift or abnormal extra-axial fluid collection is identified. No hydrocephalus or pneumocephalus. The calvarium is intact.  IMPRESSION: No acute finding.   Electronically Signed   By: Drusilla Kanner M.D.   On: 12/26/2012 10:27   Dg Pelvis Portable  12/27/2012   CLINICAL DATA:  Status  post right hip replacement.  EXAM: PORTABLE PELVIS 1-2 VIEWS  COMPARISON:  Abdomen radiographs dated 02/26/2011.  FINDINGS: Right hip prosthesis in satisfactory position and alignment. No fracture or dislocation seen.  IMPRESSION: Satisfactory postoperative appearance of a right hip prosthesis.   Electronically Signed   By: Gordan Payment M.D.   On: 12/27/2012 02:39   Dg Chest Port 1 View  12/25/2012   CLINICAL DATA:  Pre hip surgery evaluation.  EXAM: PORTABLE CHEST - 1 VIEW  COMPARISON:  08/26/2011.  FINDINGS: Normal sized heart. Clear lungs with normal vascularity. Post CABG changes. Unremarkable bones.  IMPRESSION: No acute abnormality.   Electronically Signed   By: Gordan Payment M.D.   On: 12/25/2012 22:57    Microbiology: Recent Results (from the past 240 hour(s))  SURGICAL PCR SCREEN     Status: None   Collection Time    12/26/12  4:40 AM      Result Value Range Status   MRSA, PCR NEGATIVE  NEGATIVE Final   Staphylococcus aureus NEGATIVE  NEGATIVE Final   Comment:            The Xpert SA Assay (FDA     approved for NASAL specimens     in patients over 79 years of age),     is one component of     a comprehensive surveillance     program.  Test performance has     been validated by [employer] for patients greater     than or equal to 16 year old.     It is not intended     to diagnose infection nor to     guide or monitor treatment.     Labs: Basic Metabolic Panel:  Recent  Labs Lab 12/26/12 0525 12/27/12 0507 12/27/12 1800 12/28/12 0522 12/29/12 0820  NA 135 135 129* 131* 136  K 4.5 4.6 5.7* 4.9 4.8  CL 105 101 98 102 105  CO2 20 14* 20 19 21   GLUCOSE 193* 236* 364* 188* 209*  BUN 23 21 31* 34* 31*  CREATININE 1.23 1.31 1.75* 1.68* 1.47*  CALCIUM 8.3* 8.4 8.0* 8.0* 7.8*   Liver Function Tests:  Recent Labs Lab 12/25/12 1815  AST 23  ALT 22  ALKPHOS 123*  BILITOT 0.2*  PROT 7.3  ALBUMIN 3.5   No results found for this basename: LIPASE, AMYLASE,  in the last 168 hours No results found for this basename: AMMONIA,  in the last 168 hours CBC:  Recent Labs Lab 12/25/12 1815 12/26/12 0525 12/27/12 0507 12/28/12 0522 12/29/12 0820  WBC 12.0* 14.9* 14.9* 9.9 8.9  NEUTROABS 8.5*  --   --   --   --   HGB 12.8* 11.5* 10.7* 8.0* 7.7*  HCT 37.1* 32.6* 31.8* 23.5* 21.6*  MCV 91.8 90.8 93.8 91.4 90.8  PLT 244 221 225 137* 166   Cardiac Enzymes: No results found for this basename: CKTOTAL, CKMB, CKMBINDEX, TROPONINI,  in the last 168 hours BNP: BNP (last 3 results) No results found for this basename: PROBNP,  in the last 8760 hours CBG:  Recent Labs Lab 12/28/12 0641 12/28/12 1118 12/28/12 1617 12/28/12 2138 12/29/12 0638  GLUCAP 172* 244* 246* 236* 185*       Signed:  Ardys Hataway  [facility] 12/29/2012, 10:35 AM

## 2012-12-30 LAB — TYPE AND SCREEN
ABO/RH(D): AB POS
Antibody Screen: NEGATIVE

## 2013-01-01 ENCOUNTER — Other Ambulatory Visit: Payer: Self-pay | Admitting: *Deleted

## 2013-01-01 ENCOUNTER — Encounter (HOSPITAL_COMMUNITY): Payer: Self-pay | Admitting: Orthopedic Surgery

## 2013-01-01 MED ORDER — HYDROCODONE-ACETAMINOPHEN 5-325 MG PO TABS: ORAL_TABLET | ORAL | Status: DC

## 2013-01-03 ENCOUNTER — Non-Acute Institutional Stay (SKILLED_NURSING_FACILITY): Payer: Medicare Other | Admitting: Adult Health

## 2013-01-03 ENCOUNTER — Encounter: Payer: Self-pay | Admitting: Adult Health

## 2013-01-03 DIAGNOSIS — N400 Enlarged prostate without lower urinary tract symptoms: Secondary | ICD-10-CM

## 2013-01-03 DIAGNOSIS — E785 Hyperlipidemia, unspecified: Secondary | ICD-10-CM

## 2013-01-03 DIAGNOSIS — K219 Gastro-esophageal reflux disease without esophagitis: Secondary | ICD-10-CM

## 2013-01-03 DIAGNOSIS — S72001A Fracture of unspecified part of neck of right femur, initial encounter for closed fracture: Secondary | ICD-10-CM

## 2013-01-03 DIAGNOSIS — E1101 Type 2 diabetes mellitus with hyperosmolarity with coma: Secondary | ICD-10-CM

## 2013-01-03 DIAGNOSIS — I25810 Atherosclerosis of coronary artery bypass graft(s) without angina pectoris: Secondary | ICD-10-CM

## 2013-01-03 DIAGNOSIS — R569 Unspecified convulsions: Secondary | ICD-10-CM

## 2013-01-03 DIAGNOSIS — I10 Essential (primary) hypertension: Secondary | ICD-10-CM

## 2013-01-03 DIAGNOSIS — S72001D Fracture of unspecified part of neck of right femur, subsequent encounter for closed fracture with routine healing: Secondary | ICD-10-CM

## 2013-01-03 DIAGNOSIS — E1100 Type 2 diabetes mellitus with hyperosmolarity without nonketotic hyperglycemic-hyperosmolar coma (NKHHC): Secondary | ICD-10-CM

## 2013-01-03 DIAGNOSIS — S72009D Fracture of unspecified part of neck of unspecified femur, subsequent encounter for closed fracture with routine healing: Secondary | ICD-10-CM

## 2013-01-03 HISTORY — DX: Fracture of unspecified part of neck of right femur, initial encounter for closed fracture: S72.001A

## 2013-01-03 NOTE — Progress Notes (Signed)
Patient ID: [name], male   DOB: 03-Dec-1928, 79 y.o.   MRN: [id number]     [facility]  No Known Allergies   Chief Complaint  Patient presents with  . Hospitalization Follow-up    HPI: He had bene hospitalized following a fall in which he fractured his right hip requiring surgical intervention. He is here for short term rehab and will return back home as soon he has completed therapy.     Past Medical History  Diagnosis Date  . CAD (coronary artery disease)     s/p cabg 3/10...s/p MI age 57  . Diabetes mellitus type II   . HTN (hypertension)   . HLD (hyperlipidemia)   . Anxiety disorder   . Tremor   . GERD (gastroesophageal reflux disease)   . Pulmonary vein stenosis   . Angina   . Chronic kidney disease   . Anxiety   . Myocardial infarction 1970's; 2010  . Aspiration pneumonia     History of aspiration pneumonia with vent-dependent respiratory/notes 06/12/2008 (12/26/2012)  . Orthopnoea     "just recently" (12/26/2012)  . History of stomach ulcers     "long time ago" (12/26/2012)  . Seizures     "I've had them all my life; last one was 04/2008" (12/26/2012)  . Chronic lower back pain   . Depression     "used to have problems w/depression" (12/26/2012)  . Fall at home 12/25/2012    "stumbled over cord while I was blowing leaves; broke my right hip" (12/26/2012)    Past Surgical History  Procedure Laterality Date  . Coronary artery bypass graft  04/2008    "CABG X3" (12/26/2012)  . Inguinal hernia repair Right   . Cataract extraction w/ intraocular lens  implant, bilateral Bilateral ~2004  . Hip arthroplasty Right 12/26/2012    Procedure: Right Hip Hemiarthroplasty;  Surgeon: Sheral Apley, MD;  Location: [facility] OR;  Service: Orthopedics;  Laterality: Right;    VITAL SIGNS BP 162/60  Pulse 72  Ht 5\' 10"  (1.778 m)  Wt 198 lb (89.812 kg)  BMI 28.41 kg/m2   Patient's Medications  New Prescriptions   No medications on file  Previous Medications     ASPIRIN EC 325 MG TABLET    Take 1 tablet (325 mg total) by mouth daily.   CARBAMAZEPINE (EPITOL) 200 MG TABLET    Take 400 mg by mouth at bedtime.   CARVEDILOL (COREG) 25 MG TABLET    Take 25 mg by mouth 2 (two) times daily with a meal.    CLOPIDOGREL (PLAVIX) 75 MG TABLET    Take 1 tablet (75 mg total) by mouth daily with breakfast.   EXENATIDE ER 2 MG PEN    Inject 2 mg into the skin See admin instructions. Take on mondays nights   HYDROCODONE-ACETAMINOPHEN (NORCO) 5-325 MG PER TABLET    Take two tablets by mouth every 4 hours as needed for pain.   INSULIN GLARGINE (LANTUS) 100 UNIT/ML INJECTION    Inject 0.25 mLs (25 Units total) into the skin at bedtime.   MIRTAZAPINE (REMERON SOL-TAB) 15 MG DISINTEGRATING TABLET    Take 15 mg by mouth at bedtime.   PANTOPRAZOLE (PROTONIX) 40 MG TABLET    Take 40 mg by mouth daily.    PHENYTOIN (DILANTIN) 100 MG ER CAPSULE    Take 300 mg by mouth at bedtime.   POLYETHYLENE GLYCOL (MIRALAX / GLYCOLAX) PACKET    Take 17 g by mouth daily.  SIMVASTATIN (ZOCOR) 40 MG TABLET    Take 40 mg by mouth at bedtime.    TAMSULOSIN HCL (FLOMAX) 0.4 MG CAPS    Take 0.4 mg by mouth daily.  Modified Medications   No medications on file  Discontinued Medications   PHENYTOIN (DILANTIN) 30 MG ER CAPSULE    Take 30 mg by mouth at bedtime.    SIGNIFICANT DIAGNOSTIC EXAMS  12-25-12: right hip x-ray: Right femoral neck fracture with minimal displacement  12-25-12; chest x-ray: No acute abnormality.  12-26-12: ct of head: No acute finding.  12-27-12: pelvic x-ray: Satisfactory postoperative appearance of a right hip prosthesis.      LABS REVIEWED:   12-25-12: wbc 12.0; hgb 12.8; hct 37.1; mcv 91.8; plt 244; glucose 229; bun 26; creat 1.25; k+ 5.0; na++135; alk phos 123; albumin 3.5 12-26-12: vit d 35; tegretol 5.6; dilantin 12.8 12-29-12: wbc 8.9; hgb 7.7; hct 21.;6 mcv 90.8;plt 166; glucose 201; bun 31; creat 1.47; k+4.8; na++136     Review of Systems   Constitutional: Negative for weight loss and malaise/fatigue.  Eyes: Negative for blurred vision.  Respiratory: Negative for cough, shortness of breath and wheezing.   Cardiovascular: Negative for chest pain, palpitations and claudication.  Gastrointestinal: Negative for heartburn, abdominal pain and constipation.  Musculoskeletal: Negative for joint pain and myalgias.       Right hip pain is well managed  Skin: Negative for itching and rash.  Neurological: Negative for headaches.  Psychiatric/Behavioral: Negative for depression. The patient does not have insomnia.     Physical Exam  Constitutional: He is oriented to person, place, and time. He appears well-developed and well-nourished. No distress.  Neck: Neck supple. No JVD present. No thyromegaly present.  Cardiovascular: Normal rate, regular rhythm and intact distal pulses.   Respiratory: Effort normal and breath sounds normal. No respiratory distress. He has no wheezes.  GI: Soft. Bowel sounds are normal. He exhibits no distension. There is no tenderness.  Musculoskeletal: Normal range of motion. He exhibits no edema.  Is ambulating in therapy with a walker   Neurological: He is alert and oriented to person, place, and time.  Skin: Skin is warm and dry. He is not diaphoretic.  Hip without signs of infection present   Psychiatric: He has a normal mood and affect.       ASSESSMENT/ PLAN:  1. Right hip fracture he is presently doing well; will continue with therapy as directed; will continue vicodin 5/325 mg 2 tabs every 4 hours as needed and will monitor his status   2. Seizures: no reports of seizure activity present; will continue tegretol 400 mg nightly and dilantin 300 mg nightly  3. Cad: he is stable no chest pain present will continue asa 325 mg daily and plavix 75 mg daily  4. Hypertension: is stable will continue coreg 25 mg twice daily  5. Dyslipidemia: will continue zocor 40 mg daily  6. Constipation: will  continue miralax daily and  7. Gerd: will continue protonix 40 mg daily  8. Bph: will continue flomax 0.4 mg daily  9. Diabetes: is stable will continue his lantus 25 units daily and will continue bydureon 2 mg weekly and will monitor his status.    Time spent with patient 50 minutes

## 2013-01-04 DIAGNOSIS — N400 Enlarged prostate without lower urinary tract symptoms: Secondary | ICD-10-CM | POA: Insufficient documentation

## 2013-01-06 ENCOUNTER — Inpatient Hospital Stay (HOSPITAL_COMMUNITY)
Admission: EM | Admit: 2013-01-06 | Discharge: 2013-01-10 | DRG: 100 | Disposition: A | Discharge status: Skilled Nursing Facility | Payer: Medicare Other | Attending: Internal Medicine | Admitting: Internal Medicine

## 2013-01-06 ENCOUNTER — Emergency Department (HOSPITAL_COMMUNITY): Payer: Medicare Other

## 2013-01-06 ENCOUNTER — Encounter (HOSPITAL_COMMUNITY): Payer: Self-pay | Admitting: Emergency Medicine

## 2013-01-06 DIAGNOSIS — E871 Hypo-osmolality and hyponatremia: Secondary | ICD-10-CM | POA: Diagnosis present

## 2013-01-06 DIAGNOSIS — D649 Anemia, unspecified: Secondary | ICD-10-CM

## 2013-01-06 DIAGNOSIS — R1310 Dysphagia, unspecified: Secondary | ICD-10-CM | POA: Diagnosis present

## 2013-01-06 DIAGNOSIS — Z951 Presence of aortocoronary bypass graft: Secondary | ICD-10-CM

## 2013-01-06 DIAGNOSIS — E119 Type 2 diabetes mellitus without complications: Secondary | ICD-10-CM | POA: Diagnosis present

## 2013-01-06 DIAGNOSIS — N179 Acute kidney failure, unspecified: Secondary | ICD-10-CM

## 2013-01-06 DIAGNOSIS — I119 Hypertensive heart disease without heart failure: Secondary | ICD-10-CM

## 2013-01-06 DIAGNOSIS — E876 Hypokalemia: Secondary | ICD-10-CM

## 2013-01-06 DIAGNOSIS — S72009S Fracture of unspecified part of neck of unspecified femur, sequela: Secondary | ICD-10-CM

## 2013-01-06 DIAGNOSIS — R279 Unspecified lack of coordination: Secondary | ICD-10-CM

## 2013-01-06 DIAGNOSIS — I639 Cerebral infarction, unspecified: Secondary | ICD-10-CM | POA: Diagnosis present

## 2013-01-06 DIAGNOSIS — I25810 Atherosclerosis of coronary artery bypass graft(s) without angina pectoris: Secondary | ICD-10-CM

## 2013-01-06 DIAGNOSIS — D72829 Elevated white blood cell count, unspecified: Secondary | ICD-10-CM | POA: Diagnosis present

## 2013-01-06 DIAGNOSIS — I6789 Other cerebrovascular disease: Secondary | ICD-10-CM | POA: Diagnosis present

## 2013-01-06 DIAGNOSIS — M545 Low back pain, unspecified: Secondary | ICD-10-CM | POA: Diagnosis present

## 2013-01-06 DIAGNOSIS — Z7982 Long term (current) use of aspirin: Secondary | ICD-10-CM

## 2013-01-06 DIAGNOSIS — G40401 Other generalized epilepsy and epileptic syndromes, not intractable, with status epilepticus: Principal | ICD-10-CM | POA: Diagnosis present

## 2013-01-06 DIAGNOSIS — I6350 Cerebral infarction due to unspecified occlusion or stenosis of unspecified cerebral artery: Secondary | ICD-10-CM

## 2013-01-06 DIAGNOSIS — F329 Major depressive disorder, single episode, unspecified: Secondary | ICD-10-CM | POA: Diagnosis present

## 2013-01-06 DIAGNOSIS — I2510 Atherosclerotic heart disease of native coronary artery without angina pectoris: Secondary | ICD-10-CM | POA: Diagnosis present

## 2013-01-06 DIAGNOSIS — N400 Enlarged prostate without lower urinary tract symptoms: Secondary | ICD-10-CM

## 2013-01-06 DIAGNOSIS — Z8249 Family history of ischemic heart disease and other diseases of the circulatory system: Secondary | ICD-10-CM

## 2013-01-06 DIAGNOSIS — F3289 Other specified depressive episodes: Secondary | ICD-10-CM | POA: Diagnosis present

## 2013-01-06 DIAGNOSIS — S72001S Fracture of unspecified part of neck of right femur, sequela: Secondary | ICD-10-CM

## 2013-01-06 DIAGNOSIS — G40909 Epilepsy, unspecified, not intractable, without status epilepticus: Secondary | ICD-10-CM

## 2013-01-06 DIAGNOSIS — N189 Chronic kidney disease, unspecified: Secondary | ICD-10-CM | POA: Diagnosis present

## 2013-01-06 DIAGNOSIS — E782 Mixed hyperlipidemia: Secondary | ICD-10-CM | POA: Diagnosis present

## 2013-01-06 DIAGNOSIS — I252 Old myocardial infarction: Secondary | ICD-10-CM

## 2013-01-06 DIAGNOSIS — E1100 Type 2 diabetes mellitus with hyperosmolarity without nonketotic hyperglycemic-hyperosmolar coma (NKHHC): Secondary | ICD-10-CM

## 2013-01-06 DIAGNOSIS — R4701 Aphasia: Secondary | ICD-10-CM | POA: Diagnosis present

## 2013-01-06 DIAGNOSIS — Z96649 Presence of unspecified artificial hip joint: Secondary | ICD-10-CM

## 2013-01-06 DIAGNOSIS — Z7902 Long term (current) use of antithrombotics/antiplatelets: Secondary | ICD-10-CM

## 2013-01-06 DIAGNOSIS — K219 Gastro-esophageal reflux disease without esophagitis: Secondary | ICD-10-CM | POA: Diagnosis present

## 2013-01-06 DIAGNOSIS — Z79899 Other long term (current) drug therapy: Secondary | ICD-10-CM

## 2013-01-06 DIAGNOSIS — F411 Generalized anxiety disorder: Secondary | ICD-10-CM | POA: Diagnosis present

## 2013-01-06 DIAGNOSIS — E785 Hyperlipidemia, unspecified: Secondary | ICD-10-CM | POA: Diagnosis present

## 2013-01-06 DIAGNOSIS — G8929 Other chronic pain: Secondary | ICD-10-CM | POA: Diagnosis present

## 2013-01-06 DIAGNOSIS — S72009D Fracture of unspecified part of neck of unspecified femur, subsequent encounter for closed fracture with routine healing: Secondary | ICD-10-CM

## 2013-01-06 DIAGNOSIS — R569 Unspecified convulsions: Secondary | ICD-10-CM | POA: Diagnosis present

## 2013-01-06 DIAGNOSIS — E875 Hyperkalemia: Secondary | ICD-10-CM | POA: Diagnosis present

## 2013-01-06 DIAGNOSIS — R269 Unspecified abnormalities of gait and mobility: Secondary | ICD-10-CM

## 2013-01-06 DIAGNOSIS — I6330 Cerebral infarction due to thrombosis of unspecified cerebral artery: Secondary | ICD-10-CM | POA: Diagnosis present

## 2013-01-06 DIAGNOSIS — S72001A Fracture of unspecified part of neck of right femur, initial encounter for closed fracture: Secondary | ICD-10-CM

## 2013-01-06 DIAGNOSIS — Z794 Long term (current) use of insulin: Secondary | ICD-10-CM

## 2013-01-06 DIAGNOSIS — I129 Hypertensive chronic kidney disease with stage 1 through stage 4 chronic kidney disease, or unspecified chronic kidney disease: Secondary | ICD-10-CM | POA: Diagnosis present

## 2013-01-06 DIAGNOSIS — I10 Essential (primary) hypertension: Secondary | ICD-10-CM | POA: Diagnosis present

## 2013-01-06 LAB — URINALYSIS, ROUTINE W REFLEX MICROSCOPIC
Bilirubin Urine: NEGATIVE
Glucose, UA: >1000 mg/dL — AB
Hgb urine dipstick: NEGATIVE
Ketones, ur: NEGATIVE mg/dL
Leukocytes, UA: NEGATIVE
Protein, ur: NEGATIVE mg/dL
pH: 7 (ref 5.0–8.0)

## 2013-01-06 LAB — URINE MICROSCOPIC-ADD ON

## 2013-01-06 LAB — CBC WITH DIFFERENTIAL/PLATELET
Basophils Absolute: 0 10*3/uL (ref 0.0–0.1)
Basophils Relative: 0 % (ref 0–1)
Eosinophils Relative: 2 % (ref 0–5)
HCT: 30.6 % — ABNORMAL LOW (ref 39.0–52.0)
Lymphocytes Relative: 13 % (ref 12–46)
Lymphs Abs: 1.9 10*3/uL (ref 0.7–4.0)
MCHC: 33.3 g/dL (ref 30.0–36.0)
MCV: 94.7 fL (ref 78.0–100.0)
Neutro Abs: 11.6 10*3/uL — ABNORMAL HIGH (ref 1.7–7.7)
Platelets: 650 10*3/uL — ABNORMAL HIGH (ref 150–400)
RBC: 3.23 MIL/uL — ABNORMAL LOW (ref 4.22–5.81)
RDW: 13 % (ref 11.5–15.5)
WBC: 14.6 10*3/uL — ABNORMAL HIGH (ref 4.0–10.5)

## 2013-01-06 LAB — COMPREHENSIVE METABOLIC PANEL
ALT: 18 U/L (ref 0–53)
AST: 17 U/L (ref 0–37)
Alkaline Phosphatase: 107 U/L (ref 39–117)
BUN: 24 mg/dL — ABNORMAL HIGH (ref 6–23)
CO2: 23 mEq/L (ref 19–32)
Calcium: 8.5 mg/dL (ref 8.4–10.5)
Chloride: 95 mEq/L — ABNORMAL LOW (ref 96–112)
GFR calc Af Amer: 72 mL/min — ABNORMAL LOW (ref >90)
GFR calc non Af Amer: 62 mL/min — ABNORMAL LOW (ref >90)
Glucose, Bld: 336 mg/dL — ABNORMAL HIGH (ref 70–99)
Sodium: 128 mEq/L — ABNORMAL LOW (ref 135–145)
Total Bilirubin: 0.2 mg/dL — ABNORMAL LOW (ref 0.3–1.2)

## 2013-01-06 LAB — CG4 I-STAT (LACTIC ACID): Lactic Acid, Venous: 4.35 mmol/L — ABNORMAL HIGH (ref 0.5–2.2)

## 2013-01-06 MED ORDER — METOPROLOL TARTRATE 1 MG/ML IV SOLN
5.0000 mg | Freq: Four times a day (QID) | INTRAVENOUS | Status: DC
  Administered 2013-01-07 – 2013-01-08 (×5): 5 mg via INTRAVENOUS
  Filled 2013-01-06 (×11): qty 5

## 2013-01-06 MED ORDER — INSULIN ASPART 100 UNIT/ML ~~LOC~~ SOLN
0.0000 [IU] | SUBCUTANEOUS | Status: DC
  Administered 2013-01-07 (×2): 2 [IU] via SUBCUTANEOUS
  Administered 2013-01-07: 5 [IU] via SUBCUTANEOUS
  Administered 2013-01-07 – 2013-01-09 (×5): 2 [IU] via SUBCUTANEOUS
  Filled 2013-01-06: qty 1

## 2013-01-06 MED ORDER — PANTOPRAZOLE SODIUM 40 MG IV SOLR
40.0000 mg | INTRAVENOUS | Status: DC
  Administered 2013-01-08 – 2013-01-09 (×3): 40 mg via INTRAVENOUS
  Filled 2013-01-06 (×5): qty 40

## 2013-01-06 MED ORDER — LORAZEPAM 2 MG/ML IJ SOLN
INTRAMUSCULAR | Status: AC
  Administered 2013-01-06: 1 mg via INTRAVENOUS
  Filled 2013-01-06: qty 1

## 2013-01-06 MED ORDER — LORAZEPAM 2 MG/ML IJ SOLN
1.0000 mg | INTRAMUSCULAR | Status: DC | PRN
  Administered 2013-01-06: 1 mg via INTRAVENOUS

## 2013-01-06 MED ORDER — SODIUM CHLORIDE 0.9 % IV BOLUS (SEPSIS)
500.0000 mL | Freq: Once | INTRAVENOUS | Status: AC
  Administered 2013-01-06: 500 mL via INTRAVENOUS

## 2013-01-06 MED ORDER — PHENYTOIN SODIUM 50 MG/ML IJ SOLN
100.0000 mg | Freq: Three times a day (TID) | INTRAMUSCULAR | Status: DC
  Administered 2013-01-07: 100 mg via INTRAVENOUS
  Filled 2013-01-06 (×5): qty 2

## 2013-01-06 MED ORDER — SODIUM CHLORIDE 0.9 % IV SOLN
1000.0000 mg | Freq: Once | INTRAVENOUS | Status: AC
  Administered 2013-01-07: 1000 mg via INTRAVENOUS
  Filled 2013-01-06: qty 20

## 2013-01-06 NOTE — ED Notes (Signed)
Patient had a seizure lasting 2 minutes. Patient has had change in mental status. Patient is now combative and disoriented x4. RN suctioned patient. Patient was alert upon arrival, but became lethargic after seizure. Seizure precautions in place.

## 2013-01-06 NOTE — ED Provider Notes (Signed)
Medical screening examination/treatment/procedure(s) were conducted as a shared visit with non-physician practitioner(s) and myself.  I personally evaluated the patient during the encounter.  EKG Interpretation    Date/Time:  Saturday January 06 2013 21:49:12 EST Ventricular Rate:  92 PR Interval:    QRS Duration: 111 QT Interval:  392 QTC Calculation: 485 R Axis:   -42 Text Interpretation:  Sinus rhythm with first degree AV block Incomplete left bundle branch block Borderline prolonged QT interval No significant change since last tracing Confirmed by [facility]  MD, Jonny Ruiz [phone]) on 01/06/2013 11:56:14 PM           This 79 year old male is sent from a nursing home to the emergency department, apparently at baseline he is able to carry on a conversation with baseline mental status is otherwise uncertain, tonight apparently had a witnessed seizure prior to arrival and arrives postictal with postictal confusion and combativeness was mumbling incomprehensible speech and patient unable to follow commands upon initial examination moving all 4 extremities spontaneously with purposeful movements pushing people away from him and pulling off his nasal cannula oxygen with pulse oximetry 98-100% on room air with unlabored respirations with clear breath sounds bilaterally.  Hurman Horn, MD 01/07/13 [phone]

## 2013-01-06 NOTE — ED Notes (Signed)
Lactic Acid reported to Dr.Bednar 

## 2013-01-06 NOTE — ED Provider Notes (Signed)
CSN: [id number]     Arrival date & time 01/06/13  2045 History   First MD Initiated Contact with Patient 01/06/13 2136     Chief Complaint  Patient presents with  . Seizures   (Consider location/radiation/quality/duration/timing/severity/associated sxs/prior Treatment) HPI Comments: The patient is an 79 yo M PMHx significant for CAD, DM, HTN, HLD, Anxiety, CKD, Depression, Seizures presenting to the ED for a witness seizure that occurred PTA while at [facility]. According to the nursing staff and EMS personal the patient had a witnessed grand mal seizures that lasted eight minutes w/o obvious oral injury or airway compromise. Patient was post-ictal upon arrival. Patient is a level V caveat d/t acuity of condition.   Patient is a 79 y.o. male presenting with seizures. The history is provided by the EMS personnel and the nursing home.  Seizures   Past Medical History  Diagnosis Date  . CAD (coronary artery disease)     s/p cabg 3/10...s/p MI age 74  . Diabetes mellitus type II   . HTN (hypertension)   . HLD (hyperlipidemia)   . Anxiety disorder   . Tremor   . GERD (gastroesophageal reflux disease)   . Pulmonary vein stenosis   . Angina   . Chronic kidney disease   . Anxiety   . Myocardial infarction 1970's; 2010  . Aspiration pneumonia     History of aspiration pneumonia with vent-dependent respiratory/notes 06/12/2008 (12/26/2012)  . Orthopnoea     "just recently" (12/26/2012)  . History of stomach ulcers     "long time ago" (12/26/2012)  . Seizures     "I've had them all my life; last one was 04/2008" (12/26/2012)  . Chronic lower back pain   . Depression     "used to have problems w/depression" (12/26/2012)  . Fall at home 12/25/2012    "stumbled over cord while I was blowing leaves; broke my right hip" (12/26/2012)   Past Surgical History  Procedure Laterality Date  . Coronary artery bypass graft  04/2008    "CABG X3" (12/26/2012)  . Inguinal hernia  repair Right   . Cataract extraction w/ intraocular lens  implant, bilateral Bilateral ~2004  . Hip arthroplasty Right 12/26/2012    Procedure: Right Hip Hemiarthroplasty;  Surgeon: Sheral Apley, MD;  Location: [facility] OR;  Service: Orthopedics;  Laterality: Right;   Family History  Problem Relation Age of Onset  . Coronary artery disease    . Hypertension     History  Substance Use Topics  . Smoking status: Never Smoker   . Smokeless tobacco: Former User    Types: Chew     Comment: 12/26/2012 "quit chewing 6-7 years ago"  . Alcohol Use: 0.0 oz/week     Comment: 12/26/2012 "used to drink; never had problems w/it; quit > 30 yrs ago"    Review of Systems  Unable to perform ROS: Acuity of condition  Neurological: Positive for seizures.    Allergies  Review of patient's allergies indicates no known allergies.  Home Medications   Current Outpatient Rx  Name  Route  Sig  Dispense  Refill  . aspirin EC 325 MG tablet   Oral   Take 1 tablet (325 mg total) by mouth daily.   30 tablet   0   . carbamazepine (EPITOL) 200 MG tablet   Oral   Take 400 mg by mouth at bedtime.         . carvedilol (COREG) 25 MG tablet   Oral  Take 25 mg by mouth 2 (two) times daily with a meal.          . clopidogrel (PLAVIX) 75 MG tablet   Oral   Take 1 tablet (75 mg total) by mouth daily with breakfast.   30 tablet   0   . Exenatide ER 2 MG PEN   Subcutaneous   Inject 2 mg into the skin See admin instructions. Take on mondays nights         . HYDROcodone-acetaminophen (NORCO) 5-325 MG per tablet      Take two tablets by mouth every 4 hours as needed for pain.   360 tablet   0   . Insulin Glargine (LANTUS SOLOSTAR) 100 UNIT/ML SOPN   Subcutaneous   Inject 25 Units into the skin at bedtime.         . mirtazapine (REMERON) 15 MG tablet   Oral   Take 15 mg by mouth at bedtime.         . pantoprazole (PROTONIX) 40 MG tablet   Oral   Take 40 mg by mouth daily.           . phenytoin (DILANTIN) 100 MG ER capsule   Oral   Take 300 mg by mouth at bedtime.         . polyethylene glycol (MIRALAX / GLYCOLAX) packet   Oral   Take 17 g by mouth daily.   14 each   0   . simvastatin (ZOCOR) 40 MG tablet   Oral   Take 40 mg by mouth at bedtime.          . Tamsulosin HCl (FLOMAX) 0.4 MG CAPS   Oral   Take 0.4 mg by mouth at bedtime.          Marland Kitchen tuberculin 5 UNIT/0.1ML injection   Intradermal   Inject 5 Units into the skin See admin instructions. Every Sun and Friday for 3 days for first step PPD          BP 177/112  Pulse 85  Temp(Src) 98.6 F (37 C) (Oral)  Resp 22  SpO2 96% Physical Exam  Constitutional: He appears well-developed and well-nourished.  HENT:  Head: Normocephalic and atraumatic.  Right Ear: External ear normal.  Left Ear: External ear normal.  Nose: Nose normal.  Eyes: Conjunctivae are normal.  Neck: Neck supple.  Cardiovascular: Normal rate, regular rhythm, normal heart sounds and intact distal pulses.   Pulmonary/Chest: Effort normal. No respiratory distress. He has no wheezes. He has no rales.  Abdominal: Soft. There is no tenderness.  Musculoskeletal: He exhibits no edema.  Neurological:  Post-ictal, combative.   Skin: There is pallor.    ED Course  Procedures (including critical care time) Medications  phenytoin (DILANTIN) 1,000 mg in sodium chloride 0.9 % 250 mL IVPB (1,000 mg Intravenous New Bag/Given 01/07/13 0003)  insulin aspart (novoLOG) injection 0-9 Units (not administered)  phenytoin (DILANTIN) injection 100 mg (not administered)  LORazepam (ATIVAN) injection 1 mg (not administered)  metoprolol (LOPRESSOR) injection 5 mg (not administered)  pantoprazole (PROTONIX) injection 40 mg (not administered)  LORazepam (ATIVAN) 2 MG/ML injection (1 mg  Given 01/06/13 2134)  sodium chloride 0.9 % bolus 500 mL (500 mLs Intravenous New Bag/Given 01/06/13 2302)    Labs Review Labs Reviewed  CBC WITH  DIFFERENTIAL - Abnormal; Notable for the following:    WBC 14.6 (*)    RBC 3.23 (*)    Hemoglobin 10.2 (*)    HCT 30.6 (*)  Platelets 650 (*)    Neutrophils Relative % 79 (*)    Neutro Abs 11.6 (*)    All other components within normal limits  COMPREHENSIVE METABOLIC PANEL - Abnormal; Notable for the following:    Sodium 128 (*)    Potassium 5.5 (*)    Chloride 95 (*)    Glucose, Bld 336 (*)    BUN 24 (*)    Albumin 2.8 (*)    Total Bilirubin 0.2 (*)    GFR calc non Af Amer 62 (*)    GFR calc Af Amer 72 (*)    All other components within normal limits  PHENYTOIN LEVEL, TOTAL - Abnormal; Notable for the following:    Phenytoin Lvl 2.8 (*)    All other components within normal limits  CARBAMAZEPINE LEVEL, TOTAL - Abnormal; Notable for the following:    Carbamazepine Lvl 2.7 (*)    All other components within normal limits  URINALYSIS, ROUTINE W REFLEX MICROSCOPIC - Abnormal; Notable for the following:    APPearance CLOUDY (*)    Glucose, UA >1000 (*)    All other components within normal limits  URINE MICROSCOPIC-ADD ON - Abnormal; Notable for the following:    Squamous Epithelial / LPF MANY (*)    Bacteria, UA FEW (*)    Casts HYALINE CASTS (*)    All other components within normal limits  CG4 I-STAT (LACTIC ACID) - Abnormal; Notable for the following:    Lactic Acid, Venous 4.35 (*)    All other components within normal limits  URINE CULTURE  HEMOGLOBIN A1C   Imaging Review Ct Head Wo Contrast  01/06/2013   CLINICAL DATA:  Seizures  EXAM: CT HEAD WITHOUT CONTRAST  TECHNIQUE: Contiguous axial images were obtained from the base of the skull through the vertex without intravenous contrast.  COMPARISON:  12/26/2012  FINDINGS: Skull and Sinuses:No significant abnormality.  Orbits: Bilateral cataract resection.  Brain: New small (1cm) area of low-attenuation in the anterior limb right internal capsule, also involving the caudate head. Remote lacunar infarct in the anterior  left thalamus, near the genu of internal capsule. No hemorrhage or hydrocephalus, no evidence of mass lesion. Cerebral and cerebellar volume loss.  These results were called by telephone at the time of interpretation on 01/06/2013 at 10:58 PM to Dr. Francee Piccolo , who verbally acknowledged these results.  IMPRESSION: 1. 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 2. No acute cortical abnormality to explain seizures.   Electronically Signed   By: Tiburcio Pea M.D.   On: 01/06/2013 22:59   Dg Chest Portable 1 View  01/06/2013   CLINICAL DATA:  Seizure with altered mental status  EXAM: PORTABLE CHEST - 1 VIEW  COMPARISON:  12/25/2012  FINDINGS: Status post CABG. Heart size upper normal. Vascular pattern normal. Retrocardiac area not well evaluated on single AP view. Lungs otherwise clear.  IMPRESSION: No acute findings   Electronically Signed   By: Esperanza Heir M.D.   On: 01/06/2013 22:10    EKG Interpretation    Date/Time:  Saturday January 06 2013 21:49:12 EST Ventricular Rate:  92 PR Interval:    QRS Duration: 111 QT Interval:  392 QTC Calculation: 485 R Axis:   -42 Text Interpretation:  Sinus rhythm with first degree AV block Incomplete left bundle branch block Borderline prolonged QT interval No significant change since last tracing Confirmed by [facility]  MD, JOHN [phone]) on 01/06/2013 11:56:14 PM  MDM   1. Post-ictal state   2. Seizure disorder   3. Basal ganglia infarction    Patient post ictal upon arrival. Initially combative, but that has resolved. Phenytoin and Carbamazepine levels sub-therapuetic. At this time patient is protecting his airway. New small perforater infarct to right basal ganlia noted from CT scan of head on 12/26/2012. Unsure if acute or subacute. Will admit patient for further evaluation and management. I have reviewed nursing notes, vital signs, and all appropriate lab and imaging results for this  patient. Patient d/w with Dr. Fonnie Jarvis, agrees with plan.       Jeannetta Ellis, PA-C 01/07/13 2058

## 2013-01-06 NOTE — ED Notes (Signed)
Patient is coming from [facility]. Patient has a reported seizure lasting 8 minutes. No airway compromise or oral injury noted. Patient does have a hx of seizure. Patient is now post-ictal and becoming more alert. BP 190/90 and CBG 383

## 2013-01-06 NOTE — ED Notes (Signed)
Patient transported to CT 

## 2013-01-07 ENCOUNTER — Inpatient Hospital Stay (HOSPITAL_COMMUNITY): Payer: Medicare Other

## 2013-01-07 DIAGNOSIS — E875 Hyperkalemia: Secondary | ICD-10-CM | POA: Diagnosis present

## 2013-01-07 DIAGNOSIS — E871 Hypo-osmolality and hyponatremia: Secondary | ICD-10-CM | POA: Diagnosis present

## 2013-01-07 DIAGNOSIS — G40909 Epilepsy, unspecified, not intractable, without status epilepticus: Secondary | ICD-10-CM

## 2013-01-07 DIAGNOSIS — I10 Essential (primary) hypertension: Secondary | ICD-10-CM

## 2013-01-07 DIAGNOSIS — I639 Cerebral infarction, unspecified: Secondary | ICD-10-CM | POA: Diagnosis present

## 2013-01-07 DIAGNOSIS — R569 Unspecified convulsions: Secondary | ICD-10-CM | POA: Diagnosis present

## 2013-01-07 DIAGNOSIS — I369 Nonrheumatic tricuspid valve disorder, unspecified: Secondary | ICD-10-CM

## 2013-01-07 DIAGNOSIS — E119 Type 2 diabetes mellitus without complications: Secondary | ICD-10-CM | POA: Diagnosis present

## 2013-01-07 HISTORY — DX: Cerebral infarction, unspecified: I63.9

## 2013-01-07 LAB — IRON AND TIBC
Iron: 52 ug/dL (ref 42–135)
Saturation Ratios: 28 % (ref 20–55)
TIBC: 186 ug/dL — ABNORMAL LOW (ref 215–435)

## 2013-01-07 LAB — FOLATE: Folate: 19.6 ng/mL

## 2013-01-07 LAB — BASIC METABOLIC PANEL
CO2: 24 mEq/L (ref 19–32)
Calcium: 8.8 mg/dL (ref 8.4–10.5)
Chloride: 103 mEq/L (ref 96–112)
Creatinine, Ser: 1.06 mg/dL (ref 0.50–1.35)
GFR calc Af Amer: 73 mL/min — ABNORMAL LOW (ref >90)
Sodium: 135 mEq/L (ref 135–145)

## 2013-01-07 LAB — GLUCOSE, CAPILLARY
Glucose-Capillary: 154 mg/dL — ABNORMAL HIGH (ref 70–99)
Glucose-Capillary: 164 mg/dL — ABNORMAL HIGH (ref 70–99)
Glucose-Capillary: 174 mg/dL — ABNORMAL HIGH (ref 70–99)
Glucose-Capillary: 174 mg/dL — ABNORMAL HIGH (ref 70–99)
Glucose-Capillary: 199 mg/dL — ABNORMAL HIGH (ref 70–99)
Glucose-Capillary: 281 mg/dL — ABNORMAL HIGH (ref 70–99)

## 2013-01-07 LAB — VITAMIN B12: Vitamin B-12: 1392 pg/mL — ABNORMAL HIGH (ref 211–911)

## 2013-01-07 LAB — LIPID PANEL
HDL: 39 mg/dL — ABNORMAL LOW (ref >39)
LDL Cholesterol: 54 mg/dL (ref 0–99)
VLDL: 22 mg/dL (ref 0–40)

## 2013-01-07 LAB — HEMOGLOBIN A1C
Hgb A1c MFr Bld: 7 % — ABNORMAL HIGH (ref <5.7)
Mean Plasma Glucose: 154 mg/dL — ABNORMAL HIGH (ref <117)

## 2013-01-07 LAB — CBC
HCT: 30.1 % — ABNORMAL LOW (ref 39.0–52.0)
MCHC: 34.2 g/dL (ref 30.0–36.0)
MCV: 94.7 fL (ref 78.0–100.0)
Platelets: 609 10*3/uL — ABNORMAL HIGH (ref 150–400)
RBC: 3.18 MIL/uL — ABNORMAL LOW (ref 4.22–5.81)
RDW: 12.8 % (ref 11.5–15.5)
WBC: 11.3 10*3/uL — ABNORMAL HIGH (ref 4.0–10.5)

## 2013-01-07 MED ORDER — ONDANSETRON HCL 4 MG/2ML IJ SOLN: 4.0000 mg | Freq: Four times a day (QID) | INTRAMUSCULAR | Status: DC | PRN

## 2013-01-07 MED ORDER — CARBAMAZEPINE 200 MG PO TABS
400.0000 mg | ORAL_TABLET | Freq: Every day | ORAL | Status: DC
  Administered 2013-01-07 – 2013-01-09 (×3): 400 mg via ORAL
  Filled 2013-01-07 (×4): qty 2

## 2013-01-07 MED ORDER — CLOPIDOGREL BISULFATE 75 MG PO TABS: 75.0000 mg | ORAL_TABLET | Freq: Every day | ORAL | Status: DC

## 2013-01-07 MED ORDER — ACETAMINOPHEN 325 MG PO TABS: 650.0000 mg | ORAL_TABLET | Freq: Four times a day (QID) | ORAL | Status: DC | PRN

## 2013-01-07 MED ORDER — ZOLPIDEM TARTRATE 5 MG PO TABS
5.0000 mg | ORAL_TABLET | Freq: Every evening | ORAL | Status: DC | PRN
  Administered 2013-01-07: 5 mg via ORAL
  Filled 2013-01-07: qty 1

## 2013-01-07 MED ORDER — ASPIRIN 300 MG RE SUPP
300.0000 mg | Freq: Every day | RECTAL | Status: DC
  Filled 2013-01-07: qty 1

## 2013-01-07 MED ORDER — SODIUM CHLORIDE 0.9 % IJ SOLN
3.0000 mL | Freq: Two times a day (BID) | INTRAMUSCULAR | Status: DC
  Administered 2013-01-08: 3 mL via INTRAVENOUS

## 2013-01-07 MED ORDER — ASPIRIN 81 MG PO CHEW
81.0000 mg | CHEWABLE_TABLET | Freq: Every day | ORAL | Status: DC
  Administered 2013-01-07 – 2013-01-10 (×3): 81 mg via ORAL
  Filled 2013-01-07 (×2): qty 1

## 2013-01-07 MED ORDER — ONDANSETRON HCL 4 MG PO TABS: 4.0000 mg | ORAL_TABLET | Freq: Four times a day (QID) | ORAL | Status: DC | PRN

## 2013-01-07 MED ORDER — CLOPIDOGREL BISULFATE 75 MG PO TABS
75.0000 mg | ORAL_TABLET | Freq: Every day | ORAL | Status: DC
  Administered 2013-01-09 – 2013-01-10 (×2): 75 mg via ORAL
  Filled 2013-01-07 (×2): qty 1

## 2013-01-07 MED ORDER — ACETAMINOPHEN 650 MG RE SUPP: 650.0000 mg | Freq: Four times a day (QID) | RECTAL | Status: DC | PRN

## 2013-01-07 MED ORDER — ENOXAPARIN SODIUM 40 MG/0.4ML ~~LOC~~ SOLN
40.0000 mg | SUBCUTANEOUS | Status: DC
  Administered 2013-01-07 – 2013-01-10 (×4): 40 mg via SUBCUTANEOUS
  Filled 2013-01-07 (×4): qty 0.4

## 2013-01-07 MED ORDER — HYDROMORPHONE HCL PF 1 MG/ML IJ SOLN: 0.5000 mg | INTRAMUSCULAR | Status: DC | PRN

## 2013-01-07 MED ORDER — SENNOSIDES-DOCUSATE SODIUM 8.6-50 MG PO TABS
1.0000 | ORAL_TABLET | Freq: Every evening | ORAL | Status: DC | PRN
  Filled 2013-01-07: qty 1

## 2013-01-07 MED ORDER — PHENYTOIN 50 MG PO CHEW
200.0000 mg | CHEWABLE_TABLET | Freq: Two times a day (BID) | ORAL | Status: DC
  Administered 2013-01-07: 200 mg via ORAL
  Filled 2013-01-07 (×4): qty 4

## 2013-01-07 MED ORDER — SODIUM CHLORIDE 0.9 % IV SOLN: 250.0000 mg | Freq: Three times a day (TID) | INTRAVENOUS | Status: DC

## 2013-01-07 MED ORDER — ASPIRIN EC 81 MG PO TBEC
81.0000 mg | DELAYED_RELEASE_TABLET | Freq: Every day | ORAL | Status: DC
  Filled 2013-01-07: qty 1

## 2013-01-07 MED ORDER — ASPIRIN 325 MG PO TABS
325.0000 mg | ORAL_TABLET | Freq: Every day | ORAL | Status: DC
  Filled 2013-01-07: qty 1

## 2013-01-07 MED ORDER — SODIUM CHLORIDE 0.9 % IV SOLN
INTRAVENOUS | Status: AC
  Administered 2013-01-07: 13:00:00 via INTRAVENOUS

## 2013-01-07 MED ORDER — HYDRALAZINE HCL 20 MG/ML IJ SOLN: 10.0000 mg | Freq: Four times a day (QID) | INTRAMUSCULAR | Status: DC | PRN

## 2013-01-07 MED ORDER — PHENYTOIN SODIUM EXTENDED 100 MG PO CAPS
400.0000 mg | ORAL_CAPSULE | Freq: Every day | ORAL | Status: DC
  Filled 2013-01-07: qty 4

## 2013-01-07 MED ORDER — PHENYTOIN 50 MG PO CHEW
200.0000 mg | CHEWABLE_TABLET | Freq: Two times a day (BID) | ORAL | Status: DC
  Filled 2013-01-07 (×2): qty 4

## 2013-01-07 MED ORDER — ALUM & MAG HYDROXIDE-SIMETH 200-200-20 MG/5ML PO SUSP: 30.0000 mL | Freq: Four times a day (QID) | ORAL | Status: DC | PRN

## 2013-01-07 MED ORDER — SODIUM CHLORIDE 0.9 % IV SOLN
INTRAVENOUS | Status: DC
  Administered 2013-01-07: 06:00:00 via INTRAVENOUS

## 2013-01-07 MED ORDER — ASPIRIN 300 MG RE SUPP
150.0000 mg | Freq: Every day | RECTAL | Status: DC
  Filled 2013-01-07: qty 1

## 2013-01-07 NOTE — Progress Notes (Signed)
The patient [name] has current phenytoin level of  Phenytoin Lvl  Date Value Range Status  01/06/2013 2.8* 10.0 - 20.0 ug/mL Final  12/26/2012 12.8  10.0 - 20.0 ug/mL Final    Other pertinent lab values include: No components found with this basename: LABALBU   Creatinine, Ser  Date Value Range Status  01/07/2013 1.06  0.50 - 1.35 mg/dL Final   The patient was loaded with 1,000 mg IV of phenytoin. Spoke to [facility], who reports patient was compliant on PTA phenytoin 300mg  ER qhs. Maintenance dose calculated to be approximately 400mg /day using actual body weight. Will continue to monitor phenytoin levels, albumin and creatinine as needed for dosing adjustments.  SCr 1.06 CrCl 55 Albumin 2.8, corrected phenytoin level = 4.24  Plan: Dose phenytoin 200mg  PO BID Check phenytoin levels in 5-7 days (steady state)  Thank you for allowing me to take part in the care of this patient,   Britt Bottom B. Artelia Laroche, PharmD Clinical Pharmacist - Resident Pager: [phone] Phone: [phone] 01/07/2013 1:59 PM

## 2013-01-07 NOTE — H&P (Signed)
[facility] History and Physical  [name] ZOX:[id number] DOB: 01-Dec-1928 DOA: 01/06/2013  Referring physician: EDP PCP: [name], MD  Specialists:   Chief Complaint: Seizures  HPI: [name] is a 79 y.o. male who is residing at the [facility] post surgery for a right Femoral Neck Fracture on 12/26/2012 who suffered 2 seizures, and was sent to the ED. The patient is post ictal and can not give a history, but his son is at the bedside and he states that when he spoke to his father earlier in the day he was not making sense, so he went to the facility and while he was there his father was witnessed to have a grand- mal seizure that lasted about 15 minutes. EMS was called and he was taken to the ED, and he had another seizure that reported lasted 8 minutes.  He was medicated with 1 mg of  IV Ativan once.      Review of Systems: Unable to Obtain from the patient    Past Medical History  Diagnosis Date  . CAD (coronary artery disease)     s/p cabg 3/10...s/p MI age 23  . Diabetes mellitus type II   . HTN (hypertension)   . HLD (hyperlipidemia)   . Anxiety disorder   . Tremor   . GERD (gastroesophageal reflux disease)   . Pulmonary vein stenosis   . Angina   . Chronic kidney disease   . Anxiety   . Myocardial infarction 1970's; 2010  . Aspiration pneumonia     History of aspiration pneumonia with vent-dependent respiratory/notes 06/12/2008 (12/26/2012)  . Orthopnoea     "just recently" (12/26/2012)  . History of stomach ulcers     "long time ago" (12/26/2012)  . Seizures     "I've had them all my life; last one was 04/2008" (12/26/2012)  . Chronic lower back pain   . Depression     "used to have problems w/depression" (12/26/2012)  . Fall at home 12/25/2012    "stumbled over cord while I was blowing leaves; broke my right hip" (12/26/2012)    Past Surgical History  Procedure Laterality Date  . Coronary artery bypass graft  04/2008     "CABG X3" (12/26/2012)  . Inguinal hernia repair Right   . Cataract extraction w/ intraocular lens  implant, bilateral Bilateral ~2004  . Hip arthroplasty Right 12/26/2012    Procedure: Right Hip Hemiarthroplasty;  Surgeon: Sheral Apley, MD;  Location: [facility] OR;  Service: Orthopedics;  Laterality: Right;    Prior to Admission medications   Medication Sig Start Date End Date Taking? Authorizing Provider  aspirin EC 325 MG tablet Take 1 tablet (325 mg total) by mouth daily. 12/26/12  Yes Sheral Apley, MD  carbamazepine (EPITOL) 200 MG tablet Take 400 mg by mouth at bedtime.   Yes Historical Provider, MD  carvedilol (COREG) 25 MG tablet Take 25 mg by mouth 2 (two) times daily with a meal.    Yes Historical Provider, MD  clopidogrel (PLAVIX) 75 MG tablet Take 1 tablet (75 mg total) by mouth daily with breakfast. 05/20/12  Yes Belkys A Regalado, MD  Exenatide ER 2 MG PEN Inject 2 mg into the skin See admin instructions. Take on mondays nights   Yes Historical Provider, MD  HYDROcodone-acetaminophen (NORCO) 5-325 MG per tablet Take two tablets by mouth every 4 hours as needed for pain. 01/01/13  Yes Sharee Holster, NP  Insulin Glargine (LANTUS SOLOSTAR) 100  UNIT/ML SOPN Inject 25 Units into the skin at bedtime.   Yes Historical Provider, MD  mirtazapine (REMERON) 15 MG tablet Take 15 mg by mouth at bedtime.   Yes Historical Provider, MD  pantoprazole (PROTONIX) 40 MG tablet Take 40 mg by mouth daily.    Yes Historical Provider, MD  phenytoin (DILANTIN) 100 MG ER capsule Take 300 mg by mouth at bedtime.   Yes Historical Provider, MD  polyethylene glycol (MIRALAX / GLYCOLAX) packet Take 17 g by mouth daily. 12/29/12  Yes Belkys A Regalado, MD  simvastatin (ZOCOR) 40 MG tablet Take 40 mg by mouth at bedtime.    Yes Historical Provider, MD  Tamsulosin HCl (FLOMAX) 0.4 MG CAPS Take 0.4 mg by mouth at bedtime.    Yes Historical Provider, MD  tuberculin 5 UNIT/0.1ML injection Inject 5 Units into the  skin See admin instructions. Every Sun and Friday for 3 days for first step PPD   Yes Historical Provider, MD     No Known Allergies     Social History:  reports that he has never smoked. He has quit using smokeless tobacco. His smokeless tobacco use included Chew. He reports that he drinks alcohol. He reports that he does not use illicit drugs.     Family History  Problem Relation Age of Onset  . Coronary artery disease    . Hypertension        Physical Exam:  GEN:  Pleasant Obtunded thin Elderly  79 y.o.  Caucasian male  examined  and in no acute distress; cooperative with exam Filed Vitals:   01/06/13 2105 01/06/13 2200  BP: 180/118 177/112  Pulse: 81 85  Temp: 98.6 F (37 C)   TempSrc: Oral   Resp: 19 22  SpO2: 97% 96%   Blood pressure 177/112, pulse 85, temperature 98.6 F (37 C), temperature source Oral, resp. rate 22, SpO2 96.00%. PSYCH: SHe is alert and oriented x1; does not appear anxious does not appear depressed; affect is normal HEENT: Normocephalic and Atraumatic, Mucous membranes pink; PERRLA; EOM intact; Fundi:  Benign;  No scleral icterus, Nares: Patent, Oropharynx: Clear, Edentulous ;  Neck:  FROM, no cervical lymphadenopathy nor thyromegaly or carotid bruit; no JVD; Breasts:: Not examined CHEST WALL: No tenderness CHEST: Normal respiration, clear to auscultation bilaterally HEART: Regular rate and rhythm; no murmurs rubs or gallops BACK: No kyphosis or scoliosis; no CVA tenderness ABDOMEN: Positive Bowel Sounds, Scaphoid, soft non-tender; no masses, no organomegaly. Rectal Exam: Not done EXTREMITIES: No cyanosis, clubbing or edema; no ulcerations. Genitalia: not examined PULSES: 2+ and symmetric SKIN: Normal hydration no rash or ulceration CNS: Cranial nerves 2-12 grossly intact no focal neurologic deficit    Labs on Admission:  Basic Metabolic Panel:  Recent Labs Lab 01/06/13 2137  NA 128*  K 5.5*  CL 95*  CO2 23  GLUCOSE 336*  BUN 24*   CREATININE 1.08  CALCIUM 8.5   Liver Function Tests:  Recent Labs Lab 01/06/13 2137  AST 17  ALT 18  ALKPHOS 107  BILITOT 0.2*  PROT 6.7  ALBUMIN 2.8*   No results found for this basename: LIPASE, AMYLASE,  in the last 168 hours No results found for this basename: AMMONIA,  in the last 168 hours CBC:  Recent Labs Lab 01/06/13 2137  WBC 14.6*  NEUTROABS 11.6*  HGB 10.2*  HCT 30.6*  MCV 94.7  PLT 650*   Cardiac Enzymes: No results found for this basename: CKTOTAL, CKMB, CKMBINDEX, TROPONINI,  in the last  168 hours  BNP (last 3 results) No results found for this basename: PROBNP,  in the last 8760 hours CBG:  Recent Labs Lab 01/07/13 0023  GLUCAP 281*    Radiological Exams on Admission: Ct Head Wo Contrast  01/06/2013   CLINICAL DATA:  Seizures  EXAM: CT HEAD WITHOUT CONTRAST  TECHNIQUE: Contiguous axial images were obtained from the base of the skull through the vertex without intravenous contrast.  COMPARISON:  12/26/2012  FINDINGS: Skull and Sinuses:No significant abnormality.  Orbits: Bilateral cataract resection.  Brain: New small (1cm) area of low-attenuation in the anterior limb right internal capsule, also involving the caudate head. Remote lacunar infarct in the anterior left thalamus, near the genu of internal capsule. No hemorrhage or hydrocephalus, no evidence of mass lesion. Cerebral and cerebellar volume loss.  These results were called by telephone at the time of interpretation on 01/06/2013 at 10:58 PM to Dr. Francee Piccolo , who verbally acknowledged these results.  IMPRESSION: 1. 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 2. No acute cortical abnormality to explain seizures.   Electronically Signed   By: Tiburcio Pea M.D.   On: 01/06/2013 22:59   Dg Chest Portable 1 View  01/06/2013   CLINICAL DATA:  Seizure with altered mental status  EXAM: PORTABLE CHEST - 1 VIEW  COMPARISON:  12/25/2012  FINDINGS: Status  post CABG. Heart size upper normal. Vascular pattern normal. Retrocardiac area not well evaluated on single AP view. Lungs otherwise clear.  IMPRESSION: No acute findings   Electronically Signed   By: Esperanza Heir M.D.   On: 01/06/2013 22:10     EKG: Independently reviewed. Sinus Rhythm, No acute S-T changes,  LBBB.     Assessment/Plan Principal Problem:   Seizures Active Problems:   Hyponatremia   Hyperkalemia   Diabetes mellitus type 2 in nonobese   HYPERLIPIDEMIA-MIXED   HYPERTENSION, BENIGN   CORONARY ATHEROSCLEROSIS NATIVE CORONARY ARTERY   Fracture of femoral neck, right    CVA- Acute Versus Subacute    1.  Seizures-  Admit To Telemetry Bed for Monitoring,  Placed on Seizure Precautions, Loaded with IV Dilantin 1000 mg x 1 and to received 100 mg IV q 8 hours,  And PRN IV ativan for Seizure Activity.    EEG study in AM.  Restart his Tegretol Rx once he is awake and alert.  Neuro checks  ordered and NPO while Obtunded.    2.  Subacute versus Acute CVA-  On Ct Scan of Head,  CVA protocol initiated, May also be cause of Recent Seizures, though he has a history of Seizure disorder.    3.  Hyponatremia- contributing to #1,  Send Urine Electrolytes and Urine Osm,  IVFs with NSS to rehydrate.  Monitor Na+ level.    4.  Hyperkalemia-  Rehydration and Correction of Hyperglycemia should improve K+ level, Monitor K+, and give Kayexalate if persist level above 5.2.   5.  DM2-  With hyperglycemia- 1/2 dose Lantus while NPO and add SSI coverage PRN.    6.  HTN-  While NPO , IV Lopressor since he can not take PO carvedilol while Obtunded.    7.  Hyperlipidemia- resume simvastatin when alert.    8.  CAD- stable.   9.  Fracture of Right Femoral Neck-  Had surgery 11/    And at [facility].    10.  DVT prophylaxis with SCDs.        Code Status:  FULL CODE Family Communication:   Son at Bedside  Disposition Plan:   Inpatient  Time spent: 56 Minutes  Ron Parker [facility] Pager [phone]  If 7PM-7AM, please contact night-coverage [email or web address] Password [facility] 01/07/2013, 12:31 AM

## 2013-01-07 NOTE — Consult Note (Signed)
Referring Physician: Lovell Sheehan    Chief Complaint: Seizure  HPI: [name] is an 79 y.o. male who is residing at the [facility] post surgery for a right Femoral Neck Fracture on 12/26/2012.  Patient is unable to provide any history today and everything must be obtained from the chart.  It seems the patient suffered 2 seizures at the facility on yesterday and was sent to the ED. His son had spoken to his father earlier in the day and he was not making sense, so he went to the facility and while he was there his father was witnessed to have a grand- mal seizure that lasted about 15 minutes. EMS was called and he was taken to the ED.  Here he had another seizure that reportedly lasted 8 minutes. He was medicated with 1 mg of IV Ativan once  Date last known well: Unable to determine Time last known well: Unable to determine tPA Given: No: LKW unable to be determined, low NIHSS  Past Medical History  Diagnosis Date  . CAD (coronary artery disease)     s/p cabg 3/10...s/p MI age 50  . Diabetes mellitus type II   . HTN (hypertension)   . HLD (hyperlipidemia)   . Anxiety disorder   . Tremor   . GERD (gastroesophageal reflux disease)   . Pulmonary vein stenosis   . Angina   . Chronic kidney disease   . Anxiety   . Myocardial infarction 1970's; 2010  . Aspiration pneumonia     History of aspiration pneumonia with vent-dependent respiratory/notes 06/12/2008 (12/26/2012)  . Orthopnoea     "just recently" (12/26/2012)  . History of stomach ulcers     "long time ago" (12/26/2012)  . Seizures     "I've had them all my life; last one was 04/2008" (12/26/2012)  . Chronic lower back pain   . Depression     "used to have problems w/depression" (12/26/2012)  . Fall at home 12/25/2012    "stumbled over cord while I was blowing leaves; broke my right hip" (12/26/2012)    Past Surgical History  Procedure Laterality Date  . Coronary artery bypass graft  04/2008    "CABG X3"  (12/26/2012)  . Inguinal hernia repair Right   . Cataract extraction w/ intraocular lens  implant, bilateral Bilateral ~2004  . Hip arthroplasty Right 12/26/2012    Procedure: Right Hip Hemiarthroplasty;  Surgeon: Sheral Apley, MD;  Location: [facility];  Service: Orthopedics;  Laterality: Right;    Family History  Problem Relation Age of Onset  . Coronary artery disease    . Hypertension     Social History:  reports that he has never smoked. He has quit using smokeless tobacco. His smokeless tobacco use included Chew. He reports that he drinks alcohol. He reports that he does not use illicit drugs.  Allergies: No Known Allergies  Medications:  I have reviewed the patient's current medications. Prior to Admission:  Prescriptions prior to admission  Medication Sig Dispense Refill  . aspirin EC 325 MG tablet Take 1 tablet (325 mg total) by mouth daily.  30 tablet  0  . carbamazepine (EPITOL) 200 MG tablet Take 400 mg by mouth at bedtime.      . carvedilol (COREG) 25 MG tablet Take 25 mg by mouth 2 (two) times daily with a meal.       . clopidogrel (PLAVIX) 75 MG tablet Take 1 tablet (75 mg total) by mouth daily with breakfast.  30 tablet  0  . Exenatide ER 2 MG PEN Inject 2 mg into the skin See admin instructions. Take on mondays nights      . HYDROcodone-acetaminophen (NORCO) 5-325 MG per tablet Take two tablets by mouth every 4 hours as needed for pain.  360 tablet  0  . Insulin Glargine (LANTUS SOLOSTAR) 100 UNIT/ML SOPN Inject 25 Units into the skin at bedtime.      . mirtazapine (REMERON) 15 MG tablet Take 15 mg by mouth at bedtime.      . pantoprazole (PROTONIX) 40 MG tablet Take 40 mg by mouth daily.       . phenytoin (DILANTIN) 100 MG ER capsule Take 300 mg by mouth at bedtime.      . polyethylene glycol (MIRALAX / GLYCOLAX) packet Take 17 g by mouth daily.  14 each  0  . simvastatin (ZOCOR) 40 MG tablet Take 40 mg by mouth at bedtime.       . Tamsulosin HCl (FLOMAX) 0.4 MG CAPS  Take 0.4 mg by mouth at bedtime.       Marland Kitchen tuberculin 5 UNIT/0.1ML injection Inject 5 Units into the skin See admin instructions. Every Sun and Friday for 3 days for first step PPD       Scheduled: . aspirin  300 mg Rectal Daily   Or  . aspirin  325 mg Oral Daily  . insulin aspart  0-9 Units Subcutaneous Q4H  . metoprolol  5 mg Intravenous Q6H  . pantoprazole (PROTONIX) IV  40 mg Intravenous Q24H  . phenytoin (DILANTIN) IV  100 mg Intravenous Q8H  . sodium chloride  3 mL Intravenous Q12H    ROS: History obtained from the patient  General ROS: negative for - chills, fatigue, fever, night sweats, weight gain or weight loss Psychological ROS: negative for - behavioral disorder, hallucinations, memory difficulties, mood swings or suicidal ideation Ophthalmic ROS: negative for - blurry vision, double vision, eye pain or loss of vision ENT ROS: negative for - epistaxis, nasal discharge, oral lesions, sore throat, tinnitus or vertigo Allergy and Immunology ROS: negative for - hives or itchy/watery eyes Hematological and Lymphatic ROS: negative for - bleeding problems, bruising or swollen lymph nodes Endocrine ROS: negative for - galactorrhea, hair pattern changes, polydipsia/polyuria or temperature intolerance Respiratory ROS: negative for - cough, hemoptysis, shortness of breath or wheezing Cardiovascular ROS: negative for - chest pain, dyspnea on exertion, edema or irregular heartbeat Gastrointestinal ROS: negative for - abdominal pain, diarrhea, hematemesis, nausea/vomiting or stool incontinence Genito-Urinary ROS: negative for - dysuria, hematuria, incontinence or urinary frequency/urgency Musculoskeletal ROS: right hip pain Neurological ROS: as noted in HPI Dermatological ROS: negative for rash and skin lesion changes  Physical Examination: Blood pressure 154/61, pulse 72, temperature 96.8 F (36 C), temperature source Axillary, resp. rate 18, height 5\' 10"  (1.778 m), weight 87.317 kg  (192 lb 8 oz), SpO2 98.00%.  Neurologic Examination: Mental Status: Alert.  Not fully oriented.  Does not know why he is here.  Speech fluent without evidence of aphasia.  Has difficulty following even some one step commands and unalble to follow 3-step commands.   Cranial Nerves: II: Discs flat bilaterally; Visual fields grossly normal, pupils equal, round, reactive to light and accommodation III,IV, VI: ptosis not present, extra-ocular motions intact bilaterally V,VII: smile symmetric, facial light touch sensation normal bilaterally VIII: hearing normal bilaterally IX,X: gag reflex present XI: bilateral shoulder shrug XII: midline tongue extension Motor: Right : Upper extremity   5/5  Left:     Upper extremity   5/5  Lower extremity   4/5 hip flexor strength, 5/5 otherwise    Lower extremity   5/5 Tone and bulk:normal tone throughout; no atrophy noted Sensory: Pinprick and light touch intact throughout, bilaterally Deep Tendon Reflexes: 2+ in the upper extremities, trace at the knees and absent at the ankles.   Plantars: Right: upgoing   Left: upgoing Cerebellar: normal finger-to-nose.  Heel-to-shin testing unable to be performed Gait: Unable to test CV: pulses palpable throughout     Laboratory Studies:  Basic Metabolic Panel:  Recent Labs Lab 01/06/13 2137  NA 128*  K 5.5*  CL 95*  CO2 23  GLUCOSE 336*  BUN 24*  CREATININE 1.08  CALCIUM 8.5    Liver Function Tests:  Recent Labs Lab 01/06/13 2137  AST 17  ALT 18  ALKPHOS 107  BILITOT 0.2*  PROT 6.7  ALBUMIN 2.8*   No results found for this basename: LIPASE, AMYLASE,  in the last 168 hours No results found for this basename: AMMONIA,  in the last 168 hours  CBC:  Recent Labs Lab 01/06/13 2137  WBC 14.6*  NEUTROABS 11.6*  HGB 10.2*  HCT 30.6*  MCV 94.7  PLT 650*    Cardiac Enzymes: No results found for this basename: CKTOTAL, CKMB, CKMBINDEX, TROPONINI,  in the last 168 hours  BNP: No  components found with this basename: POCBNP,   CBG:  Recent Labs Lab 01/07/13 0023 01/07/13 0433  GLUCAP 281* 174*    Microbiology: Results for orders placed during the hospital encounter of 12/25/12  SURGICAL PCR SCREEN     Status: None   Collection Time    12/26/12  4:40 AM      Result Value Range Status   MRSA, PCR NEGATIVE  NEGATIVE Final   Staphylococcus aureus NEGATIVE  NEGATIVE Final   Comment:            The Xpert SA Assay (FDA     approved for NASAL specimens     in patients over 4 years of age),     is one component of     a comprehensive surveillance     program.  Test performance has     been validated by [employer] for patients greater     than or equal to 43 year old.     It is not intended     to diagnose infection nor to     guide or monitor treatment.    Coagulation Studies: No results found for this basename: LABPROT, INR,  in the last 72 hours  Urinalysis:  Recent Labs Lab 01/06/13 2222  COLORURINE YELLOW  LABSPEC 1.018  PHURINE 7.0  GLUCOSEU >1000*  HGBUR NEGATIVE  BILIRUBINUR NEGATIVE  KETONESUR NEGATIVE  PROTEINUR NEGATIVE  UROBILINOGEN 0.2  NITRITE NEGATIVE  LEUKOCYTESUR NEGATIVE    Lipid Panel:    Component Value Date/Time   CHOL 115 01/07/2013 0445   TRIG 111 01/07/2013 0445   HDL 39* 01/07/2013 0445   CHOLHDL 2.9 01/07/2013 0445   VLDL 22 01/07/2013 0445   LDLCALC 54 01/07/2013 0445    HgbA1C:  Lab Results  Component Value Date   HGBA1C 6.5* 05/19/2012    Urine Drug Screen:   No results found for this basename: labopia, cocainscrnur, labbenz, amphetmu, thcu, labbarb    Alcohol Level: No results found for this basename: ETH,  in the last 168 hours  Other results: EKG: sinus  rhythm with first degree A-V Block at 92 bpm.  Imaging: Dg Chest 2 View  01/07/2013   CLINICAL DATA:  Stroke  EXAM: CHEST  2 VIEW  COMPARISON:  In prior radiograph from 01/06/2013  FINDINGS: Median sternotomy wires with underlying CABG  markers are unchanged. Cardiac and mediastinal silhouettes are stable.  Lungs are mildly hypoinflated. Bibasilar linear and patchy opacities are improved as compared to the prior exam, and are most consistent with atelectasis. No pulmonary edema or pleural effusion. No pneumothorax. No focal infiltrates identified.  Osseous structures are unchanged.  IMPRESSION: Mild bibasilar atelectasis, improved as compared to prior radiograph. No acute cardiopulmonary process identified.   Electronically Signed   By: Rise Mu M.D.   On: 01/07/2013 01:45   Ct Head Wo Contrast  01/06/2013   CLINICAL DATA:  Seizures  EXAM: CT HEAD WITHOUT CONTRAST  TECHNIQUE: Contiguous axial images were obtained from the base of the skull through the vertex without intravenous contrast.  COMPARISON:  12/26/2012  FINDINGS: Skull and Sinuses:No significant abnormality.  Orbits: Bilateral cataract resection.  Brain: New small (1cm) area of low-attenuation in the anterior limb right internal capsule, also involving the caudate head. Remote lacunar infarct in the anterior left thalamus, near the genu of internal capsule. No hemorrhage or hydrocephalus, no evidence of mass lesion. Cerebral and cerebellar volume loss.  These results were called by telephone at the time of interpretation on 01/06/2013 at 10:58 PM to Dr. Francee Piccolo , who verbally acknowledged these results.  IMPRESSION: 1. 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 2. No acute cortical abnormality to explain seizures.   Electronically Signed   By: Tiburcio Pea M.D.   On: 01/06/2013 22:59   Dg Chest Portable 1 View  01/06/2013   CLINICAL DATA:  Seizure with altered mental status  EXAM: PORTABLE CHEST - 1 VIEW  COMPARISON:  12/25/2012  FINDINGS: Status post CABG. Heart size upper normal. Vascular pattern normal. Retrocardiac area not well evaluated on single AP view. Lungs otherwise clear.  IMPRESSION: No acute findings    Electronically Signed   By: Esperanza Heir M.D.   On: 01/06/2013 22:10    Assessment: 79 y.o. male with a long history of seizures presenting with breakthrough seizures.  Patient on Tegretol and Dilantin.  Both the Tegretol and Dilantin are subtherapeutic at 2.7 and 2.8 respectively.  On work up though patient had a head CT.  CT has been reviewed and shows a small right BG infarct that is new form the last imaging on 12/26/2012.  Patient on ASA and Plavix.  Neurological exam shows no deficits referable to this lesion.    Stroke Risk Factors - diabetes mellitus, hyperlipidemia and hypertension  Plan: 1. HgbA1c, fasting lipid panel 2. MRI, MRA  of the brain without contrast 3. PT consult, OT consult, Speech consult 4. Echocardiogram 5. Carotid dopplers 6. Prophylactic therapy-Continue ASA and Plavix 7. Risk factor modification 8. Telemetry monitoring 9. Frequent neuro checks 10. Continue Epitol at current dose 11.  Increase Dilantin to 400mg  at night. 12.  Seizure precautions     Thana Farr, MD [facility] [phone] 01/07/2013, 6:05 AM

## 2013-01-07 NOTE — Progress Notes (Signed)
Pt non verbal but awake this am, Becoming more verbal-saying a few words this afternoon. Follows very simple commands. No seizure activity noted. Incontinent of urine

## 2013-01-07 NOTE — Progress Notes (Signed)
[facility]                                                                                Patient Demographics  [name], is a 79 y.o. male, DOB - 1928-05-24, AVW:[id number]  Admit date - 01/06/2013   Admitting Physician Ron Parker, MD  Outpatient Primary MD for the patient is [name], MD  LOS - 1   Chief Complaint  Patient presents with  . Seizures        Assessment & Plan    1. 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 He is being seen by neuro, will do full CVA workup including MRI MRA brain, carotid duplex, echogram, check A1c and lipid panel, he will be seen by PT-OT-speech. He was on dual antiplatelet therapy of aspirin Plavix which will be continued, neurology on board and following.  Lab Results  Component Value Date   HGBA1C 6.5* 05/19/2012    Lab Results  Component Value Date   CHOL 115 01/07/2013   HDL 39* 01/07/2013   LDLCALC 54 01/07/2013   TRIG 111 01/07/2013   CHOLHDL 2.9 01/07/2013      2. Dyslipidemia. Home dose statin will be resumed once cleared by speech for oral diet.     3. Hypertension. Hold oral hypertension medications and allow for permissive hypertension secondary to CVA.     4. Seizures. Threshold could have been lowered due to acute CVA however his Dilantin level was subtherapeutic, extra dose Dilantin given. We'll switch him to IV Dilantin his by mouth status is clarified.     5. Diabetes mellitus type 2. Check A1c, reduce home dose Lantus, sliding scale insulin.   Lab Results  Component Value Date   HGBA1C 6.5* 05/19/2012    CBG (last 3)   Recent Labs  01/07/13 0023 01/07/13 0433 01/07/13 0808  GLUCAP 281* 174* 199*        Code Status: Full  Family Communication:    Disposition Plan: Rehabilitation   Procedures CT head, MRI/MRA brain, echogram, carotid duplex   Consults  neuro   Medications  Scheduled Meds: . aspirin EC   81 mg Oral Daily  . carbamazepine  400 mg Oral QHS  . clopidogrel  75 mg Oral Q breakfast  . insulin aspart  0-9 Units Subcutaneous Q4H  . metoprolol  5 mg Intravenous Q6H  . pantoprazole (PROTONIX) IV  40 mg Intravenous Q24H  . phenytoin  400 mg Oral QHS  . phenytoin (DILANTIN) IV  100 mg Intravenous Q8H  . sodium chloride  3 mL Intravenous Q12H   Continuous Infusions: . sodium chloride 75 mL/hr at 01/07/13 0603   PRN Meds:.acetaminophen, acetaminophen, alum & mag hydroxide-simeth, HYDROmorphone (DILAUDID) injection, LORazepam, ondansetron (ZOFRAN) IV, ondansetron, senna-docusate, zolpidem  DVT Prophylaxis  Lovenox   Lab Results  Component Value Date   PLT 609* 01/07/2013    Antibiotics   Anti-infectives   None          Subjective:   [name] today is in hospital bed, unable to communicate, follow some basic commands, appears to be in no distress  Objective:   Filed Vitals:   01/07/13  0030 01/07/13 0100 01/07/13 0219 01/07/13 0405  BP: 117/82 105/48 166/70 154/61  Pulse: 67 63 79 72  Temp:   96.9 F (36.1 C) 96.8 F (36 C)  TempSrc:   Oral Axillary  Resp: 15 14 16 18   Height:   5\' 10"  (1.778 m)   Weight:   87.317 kg (192 lb 8 oz)   SpO2: 96% 98% 96% 98%    Wt Readings from Last 3 Encounters:  01/07/13 87.317 kg (192 lb 8 oz)  01/03/13 89.812 kg (198 lb)  12/28/12 90.266 kg (199 lb)     Intake/Output Summary (Last 24 hours) at 01/07/13 1127 Last data filed at 01/07/13 0936  Gross per 24 hour  Intake   1000 ml  Output      0 ml  Net   1000 ml    Exam Awake but not alert, unable to communicate or express himself. Question if there is some left-sided weakness Newberg.AT,PERRAL Supple Neck,No JVD, No cervical lymphadenopathy appriciated.  Symmetrical Chest wall movement, Good air movement bilaterally, CTAB RRR,No Gallops,Rubs or new Murmurs, No Parasternal Heave +ve B.Sounds, Abd Soft, Non tender, No organomegaly appriciated, No rebound -  guarding or rigidity. No Cyanosis, Clubbing or edema, No new Rash or bruise     Data Review   Micro Results No results found for this or any previous visit (from the past 240 hour(s)).  Radiology Reports Dg Chest 2 View  01/07/2013   CLINICAL DATA:  Stroke  EXAM: CHEST  2 VIEW  COMPARISON:  In prior radiograph from 01/06/2013  FINDINGS: Median sternotomy wires with underlying CABG markers are unchanged. Cardiac and mediastinal silhouettes are stable.  Lungs are mildly hypoinflated. Bibasilar linear and patchy opacities are improved as compared to the prior exam, and are most consistent with atelectasis. No pulmonary edema or pleural effusion. No pneumothorax. No focal infiltrates identified.  Osseous structures are unchanged.  IMPRESSION: Mild bibasilar atelectasis, improved as compared to prior radiograph. No acute cardiopulmonary process identified.   Electronically Signed   By: Rise Mu M.D.   On: 01/07/2013 01:45    Ct Head Wo Contrast  01/06/2013   CLINICAL DATA:  Seizures  EXAM: CT HEAD WITHOUT CONTRAST  TECHNIQUE: Contiguous axial images were obtained from the base of the skull through the vertex without intravenous contrast.  COMPARISON:  12/26/2012  FINDINGS: Skull and Sinuses:No significant abnormality.  Orbits: Bilateral cataract resection.  Brain: New small (1cm) area of low-attenuation in the anterior limb right internal capsule, also involving the caudate head. Remote lacunar infarct in the anterior left thalamus, near the genu of internal capsule. No hemorrhage or hydrocephalus, no evidence of mass lesion. Cerebral and cerebellar volume loss.  These results were called by telephone at the time of interpretation on 01/06/2013 at 10:58 PM to Dr. Francee Piccolo , who verbally acknowledged these results.  IMPRESSION: 1. 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 2. No acute cortical abnormality to explain seizures.   Electronically Signed    By: Tiburcio Pea M.D.   On: 01/06/2013 22:59    Dg Chest Portable 1 View  01/06/2013   CLINICAL DATA:  Seizure with altered mental status  EXAM: PORTABLE CHEST - 1 VIEW  COMPARISON:  12/25/2012  FINDINGS: Status post CABG. Heart size upper normal. Vascular pattern normal. Retrocardiac area not well evaluated on single AP view. Lungs otherwise clear.  IMPRESSION: No acute findings   Electronically Signed   By: Edgar Frisk.D.  On: 01/06/2013 22:10       CBC  Recent Labs Lab 01/06/13 2137 01/07/13 0930  WBC 14.6* 11.3*  HGB 10.2* 10.3*  HCT 30.6* 30.1*  PLT 650* 609*  MCV 94.7 94.7  MCH 31.6 32.4  MCHC 33.3 34.2  RDW 13.0 12.8  LYMPHSABS 1.9  --   MONOABS 0.9  --   EOSABS 0.3  --   BASOSABS 0.0  --     Chemistries   Recent Labs Lab 01/06/13 2137 01/07/13 0930  NA 128* 135  K 5.5* 5.0  CL 95* 103  CO2 23 24  GLUCOSE 336* 219*  BUN 24* 20  CREATININE 1.08 1.06  CALCIUM 8.5 8.8  AST 17  --   ALT 18  --   ALKPHOS 107  --   BILITOT 0.2*  --    ------------------------------------------------------------------------------------------------------------------ estimated creatinine clearance is 55.5 ml/min (by C-G formula based on Cr of 1.06). ------------------------------------------------------------------------------------------------------------------ No results found for this basename: HGBA1C,  in the last 72 hours ------------------------------------------------------------------------------------------------------------------  Recent Labs  01/07/13 0445  CHOL 115  HDL 39*  LDLCALC 54  TRIG 161  CHOLHDL 2.9   ------------------------------------------------------------------------------------------------------------------ No results found for this basename: TSH, T4TOTAL, FREET3, T3FREE, THYROIDAB,  in the last 72 hours ------------------------------------------------------------------------------------------------------------------  Recent Labs   01/07/13 0445  RETICCTPCT 2.3    Coagulation profile No results found for this basename: INR, PROTIME,  in the last 168 hours  No results found for this basename: DDIMER,  in the last 72 hours  Cardiac Enzymes No results found for this basename: CK, CKMB, TROPONINI, MYOGLOBIN,  in the last 168 hours ------------------------------------------------------------------------------------------------------------------ No components found with this basename: POCBNP,      Time Spent in minutes  35   Susa Raring K M.D on 01/07/2013 at 11:27 AM  Between 7am to 7pm - Pager - [phone]  After 7pm go to [email or web address] - password [email or web address]  And look for the night coverage person covering for me after hours  [facility] Office  [phone]

## 2013-01-07 NOTE — Progress Notes (Signed)
Echocardiogram 2D Echocardiogram has been performed.  [name] 01/07/2013, 11:11 AM

## 2013-01-07 NOTE — Progress Notes (Signed)
VASCULAR LAB PRELIMINARY  PRELIMINARY  PRELIMINARY  PRELIMINARY  Carotid duplex  completed.    Preliminary report:  Bilateral:  1-39% ICA stenosis.  Vertebral artery flow is antegrade.      Kade Rickels, RVT 01/07/2013, 12:44 PM

## 2013-01-07 NOTE — ED Notes (Signed)
CBG- 281 

## 2013-01-07 NOTE — Progress Notes (Signed)
Stroke Team Progress Note  HISTORY [name] is an 79 y.o. male who is residing at the [facility] post surgery for a right Femoral Neck Fracture on 12/26/2012. Patient was unable to provide any history on admission, and everything was obtained from the chart. It seems the patient suffered 2 seizures at the facility on 01/06/2013 and was sent to the ED. His son had spoken to his father earlier in the day and he was not making sense, so he went to the facility and while he was there his father was witnessed to have a grand- mal seizure that lasted about 15 minutes. EMS was called and he was taken to the ED. There he had another seizure that reportedly lasted 8 minutes. He was medicated with 1 mg of IV Ativan once.   Date last known well: Unable to determine  Time last known well: Unable to determine  tPA Given: No: LKW unable to be determined, low NIHSS   SUBJECTIVE The patient's daughter is present this morning. The patient does not attempt to speak. He follows only some commands. The daughter is concerned that the patient may not have been receiving his Dilantin and Tegretol as prescribed at the [facility]. Levels was subtherapeutic on admission. According to the daughter has been several years since the patient has had any seizures. His current altered mental status may be the result of the recent seizures as well as his urinary tract infection.  OBJECTIVE Most recent Vital Signs: Filed Vitals:   01/07/13 0030 01/07/13 0100 01/07/13 0219 01/07/13 0405  BP: 117/82 105/48 166/70 154/61  Pulse: 67 63 79 72  Temp:   96.9 F (36.1 C) 96.8 F (36 C)  TempSrc:   Oral Axillary  Resp: 15 14 16 18   Height:   5\' 10"  (1.778 m)   Weight:   87.317 kg (192 lb 8 oz)   SpO2: 96% 98% 96% 98%   CBG (last 3)   Recent Labs  01/07/13 0023 01/07/13 0433 01/07/13 0808  GLUCAP 281* 174* 199*    IV Fluid Intake:   . sodium chloride 75 mL/hr at 01/07/13  0603    MEDICATIONS  . aspirin EC  81 mg Oral Daily  . carbamazepine  400 mg Oral QHS  . clopidogrel  75 mg Oral Q breakfast  . insulin aspart  0-9 Units Subcutaneous Q4H  . metoprolol  5 mg Intravenous Q6H  . pantoprazole (PROTONIX) IV  40 mg Intravenous Q24H  . phenytoin  400 mg Oral QHS  . phenytoin (DILANTIN) IV  100 mg Intravenous Q8H  . sodium chloride  3 mL Intravenous Q12H   PRN:  acetaminophen, acetaminophen, alum & mag hydroxide-simeth, HYDROmorphone (DILAUDID) injection, LORazepam, ondansetron (ZOFRAN) IV, ondansetron, senna-docusate, zolpidem  Diet:  NPO no liquids Activity:  Bedrest DVT Prophylaxis:  SCDs  CLINICALLY SIGNIFICANT STUDIES Basic Metabolic Panel:  Recent Labs Lab 01/06/13 2137  NA 128*  K 5.5*  CL 95*  CO2 23  GLUCOSE 336*  BUN 24*  CREATININE 1.08  CALCIUM 8.5   Liver Function Tests:  Recent Labs Lab 01/06/13 2137  AST 17  ALT 18  ALKPHOS 107  BILITOT 0.2*  PROT 6.7  ALBUMIN 2.8*   CBC:  Recent Labs Lab 01/06/13 2137 01/07/13 0930  WBC 14.6* 11.3*  NEUTROABS 11.6*  --   HGB 10.2* 10.3*  HCT 30.6* 30.1*  MCV 94.7 94.7  PLT 650* 609*   Coagulation: No results found for this  basename: LABPROT, INR,  in the last 168 hours Cardiac Enzymes: No results found for this basename: CKTOTAL, CKMB, CKMBINDEX, TROPONINI,  in the last 168 hours Urinalysis:  Recent Labs Lab 01/06/13 2222  COLORURINE YELLOW  LABSPEC 1.018  PHURINE 7.0  GLUCOSEU >1000*  HGBUR NEGATIVE  BILIRUBINUR NEGATIVE  KETONESUR NEGATIVE  PROTEINUR NEGATIVE  UROBILINOGEN 0.2  NITRITE NEGATIVE  LEUKOCYTESUR NEGATIVE   Lipid Panel    Component Value Date/Time   CHOL 115 01/07/2013 0445   TRIG 111 01/07/2013 0445   HDL 39* 01/07/2013 0445   CHOLHDL 2.9 01/07/2013 0445   VLDL 22 01/07/2013 0445   LDLCALC 54 01/07/2013 0445   HgbA1C  Lab Results  Component Value Date   HGBA1C 6.5* 05/19/2012    Urine Drug Screen:   No results found for this basename:  labopia, cocainscrnur, labbenz, amphetmu, thcu, labbarb    Alcohol Level: No results found for this basename: ETH,  in the last 168 hours  Dg Chest 2 View 01/07/2013    Mild bibasilar atelectasis, improved as compared to prior radiograph. No acute cardiopulmonary process identified.      Ct Head Wo Contrast 01/06/2013    1. 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 2. No acute cortical abnormality to explain seizures.     Dg Chest Portable 1 View 01/06/2013    No acute findings     MRI of the brain  pending  MRA of the brain  pending  2D Echocardiogram  pending  Carotid Doppler  pending   EKG sinus rhythm rate 92 beats per minute  Therapy Recommendations pending  Physical Exam    Neurologic Examination:   Mental Status:  Alert. Not fully oriented. Does not know why he is here. Speech fluent without evidence of aphasia. Has difficulty following even some one step commands and unalble to follow 3-step commands.  Cranial Nerves:  II: Discs flat bilaterally; Visual fields grossly normal, pupils equal, round, reactive to light and accommodation  III,IV, VI: ptosis not present, extra-ocular motions intact bilaterally  V,VII: smile symmetric, facial light touch sensation normal bilaterally  VIII: hearing normal bilaterally  IX,X: gag reflex present  XI: bilateral shoulder shrug  XII: midline tongue extension  Motor:  Right : Upper extremity 5/5 Left: Upper extremity 5/5  Lower extremity 4/5 hip flexor strength, 5/5 otherwise Lower extremity 5/5  Tone and bulk:normal tone throughout; no atrophy noted  Sensory: Pinprick and light touch intact throughout, bilaterally  Deep Tendon Reflexes: 2+ in the upper extremities, trace at the knees and absent at the ankles.  Plantars:  Right: upgoing Left: upgoing  Cerebellar:  normal finger-to-nose. Heel-to-shin testing unable to be performed  Gait: Unable to test  CV: pulses palpable throughout     ASSESSMENT [name] is a 79 y.o. male presenting with nonsensical speech and seizures. TPA was not given as a time of onset was uncertain and the patient had a low NIH score. 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 Infarct felt to be thrombotic secondary to small vessel disease and age indeterminant and not the cause of his presentation which is likely due to breakthru seizures in setting of UTI and not getting his AEDs. On aspirin 325 mg orally every day and clopidogrel 75 mg orally every day prior to admission. Now on aspirin 81 mg orally every day and clopidogrel 75 mg orally every day for secondary  stroke prevention. Patient with resultant mild confusion and mild right lower extremity weakness.. Work up underway.   Right femoral neck fracture with surgical repair 12/26/2012.  Diabetes mellitus - hemoglobin A1c 6.5  Hypertension  Hyperlipidemia - Zocor prior to admission - cholesterol 115 LDL 54  Coronary artery disease  Long history of seizures - on Dilantin and Tegretol prior to admission but admission levels were subtherapeutic.  Anemia no doubt related to recent surgery  Leukocytosis  Hospital day # 1  TREATMENT/PLAN  Continue aspirin 81 mg orally every day and clopidogrel 75 mg orally every day for secondary stroke prevention.  Await EEG, MRI/MRA, 2-D echo, and carotid Dopplers.  Await therapy evaluations  Risk factor modification  Hassel Neth [facility] Pager [phone] 01/07/2013, 10:41 AM  I have personally obtained a history, examined the patient, evaluated imaging results, and formulated the assessment and plan of care. I agree with the above. Delia Heady, MD

## 2013-01-08 ENCOUNTER — Inpatient Hospital Stay (HOSPITAL_COMMUNITY): Payer: Medicare Other

## 2013-01-08 LAB — URINE CULTURE
Colony Count: NO GROWTH
Culture: NO GROWTH

## 2013-01-08 LAB — CBC
HCT: 29.3 % — ABNORMAL LOW (ref 39.0–52.0)
Hemoglobin: 9.7 g/dL — ABNORMAL LOW (ref 13.0–17.0)
MCH: 31.5 pg (ref 26.0–34.0)
MCHC: 33.1 g/dL (ref 30.0–36.0)
Platelets: 608 10*3/uL — ABNORMAL HIGH (ref 150–400)
RBC: 3.08 MIL/uL — ABNORMAL LOW (ref 4.22–5.81)
RDW: 12.9 % (ref 11.5–15.5)
WBC: 11 10*3/uL — ABNORMAL HIGH (ref 4.0–10.5)

## 2013-01-08 LAB — BASIC METABOLIC PANEL
BUN: 17 mg/dL (ref 6–23)
CO2: 21 mEq/L (ref 19–32)
Calcium: 8.3 mg/dL — ABNORMAL LOW (ref 8.4–10.5)
Chloride: 100 mEq/L (ref 96–112)
Creatinine, Ser: 1.02 mg/dL (ref 0.50–1.35)
GFR calc Af Amer: 77 mL/min — ABNORMAL LOW (ref >90)
GFR calc non Af Amer: 66 mL/min — ABNORMAL LOW (ref >90)
Glucose, Bld: 188 mg/dL — ABNORMAL HIGH (ref 70–99)
Potassium: 4.3 mEq/L (ref 3.5–5.1)

## 2013-01-08 LAB — GLUCOSE, CAPILLARY
Glucose-Capillary: 174 mg/dL — ABNORMAL HIGH (ref 70–99)
Glucose-Capillary: 189 mg/dL — ABNORMAL HIGH (ref 70–99)
Glucose-Capillary: 148 mg/dL — ABNORMAL HIGH (ref 70–99)

## 2013-01-08 LAB — PHENYTOIN LEVEL, TOTAL: Phenytoin Lvl: 15.6 ug/mL (ref 10.0–20.0)

## 2013-01-08 MED ORDER — LORAZEPAM 2 MG/ML IJ SOLN
0.5000 mg | Freq: Once | INTRAMUSCULAR | Status: AC
  Administered 2013-01-08: 1 mg via INTRAVENOUS
  Filled 2013-01-08: qty 1

## 2013-01-08 MED ORDER — SODIUM CHLORIDE 0.9 % IV SOLN
500.0000 mg | INTRAVENOUS | Status: AC
  Administered 2013-01-08: 500 mg via INTRAVENOUS
  Filled 2013-01-08: qty 10

## 2013-01-08 MED ORDER — PHENYTOIN SODIUM 50 MG/ML IJ SOLN
200.0000 mg | Freq: Two times a day (BID) | INTRAMUSCULAR | Status: DC
  Administered 2013-01-08 – 2013-01-09 (×4): 200 mg via INTRAVENOUS
  Filled 2013-01-08 (×10): qty 4

## 2013-01-08 MED ORDER — SIMVASTATIN 40 MG PO TABS
40.0000 mg | ORAL_TABLET | Freq: Every day | ORAL | Status: DC
  Administered 2013-01-09 – 2013-01-10 (×2): 40 mg via ORAL
  Filled 2013-01-08 (×3): qty 1

## 2013-01-08 MED ORDER — LORAZEPAM 2 MG/ML IJ SOLN
2.0000 mg | Freq: Once | INTRAMUSCULAR | Status: AC
  Administered 2013-01-08: 2 mg via INTRAVENOUS
  Filled 2013-01-08: qty 1

## 2013-01-08 MED ORDER — LORAZEPAM 2 MG/ML IJ SOLN: 2.0000 mg | INTRAMUSCULAR | Status: AC

## 2013-01-08 MED ORDER — SODIUM CHLORIDE 0.9 % IV SOLN
1000.0000 mg | INTRAVENOUS | Status: AC
  Administered 2013-01-08: 1000 mg via INTRAVENOUS
  Filled 2013-01-08: qty 10

## 2013-01-08 NOTE — Evaluation (Signed)
Clinical/Bedside Swallow Evaluation Patient Details  Name: [name] MRN: [id number] Date of Birth: 11-27-1928  Today's Date: 01/08/2013 Time: 1100-1117 SLP Time Calculation (min): 17 min  Past Medical History:  Past Medical History  Diagnosis Date  . CAD (coronary artery disease)     s/p cabg 3/10...s/p MI age 79  . Diabetes mellitus type II   . HTN (hypertension)   . HLD (hyperlipidemia)   . Anxiety disorder   . Tremor   . GERD (gastroesophageal reflux disease)   . Pulmonary vein stenosis   . Angina   . Chronic kidney disease   . Anxiety   . Myocardial infarction 1970's; 2010  . Aspiration pneumonia     History of aspiration pneumonia with vent-dependent respiratory/notes 06/12/2008 (12/26/2012)  . Orthopnoea     "just recently" (12/26/2012)  . History of stomach ulcers     "long time ago" (12/26/2012)  . Seizures     "I've had them all my life; last one was 04/2008" (12/26/2012)  . Chronic lower back pain   . Depression     "used to have problems w/depression" (12/26/2012)  . Fall at home 12/25/2012    "stumbled over cord while I was blowing leaves; broke my right hip" (12/26/2012)   Past Surgical History:  Past Surgical History  Procedure Laterality Date  . Coronary artery bypass graft  04/2008    "CABG X3" (12/26/2012)  . Inguinal hernia repair Right   . Cataract extraction w/ intraocular lens  implant, bilateral Bilateral ~2004  . Hip arthroplasty Right 12/26/2012    Procedure: Right Hip Hemiarthroplasty;  Surgeon: Sheral Apley, MD;  Location: [facility] OR;  Service: Orthopedics;  Laterality: Right;   HPI:  79 y.o. male who is residing at the [facility] post surgery for a right Femoral Neck Fracture on 12/26/2012 who suffered 2 seizures, and was sent to the ED. The patient is post ictal and can not give a history, but his son is at the bedside and he states that when he spoke to his father earlier in the day he was not making sense, so he went  to the facility and while he was there his father was witnessed to have a grand- mal seizure that lasted about 15 minutes.  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  CXR Mild bibasilar atelectasis, improved as compared to prior radiograph. No acute cardiopulmonary process identified.    Assessment / Plan / Recommendation Clinical Impression  Pt. exhibited mild oral dysphagia with decreased manipulation and delayed transit with solid texture.  Pharyngeal phase was functional without overt s/s aspiration.  Recommend Dys 2 diet texture with thin liquids, small sips via cup or straw, pills whole in applesauce with full supervision due to confusion.  ST will continue to follow for safety, efficiency and diet texture upgrade.     Aspiration Risk   (mild-mod)    Diet Recommendation Dysphagia 2 (Fine chop);Thin liquid   Liquid Administration via: Cup;Straw Medication Administration: Whole meds with puree Supervision: Patient able to self feed;Full supervision/cueing for compensatory strategies Compensations: Slow rate;Small sips/bites Postural Changes and/or Swallow Maneuvers: Seated upright 90 degrees    Other  Recommendations Oral Care Recommendations: Oral care BID   Follow Up Recommendations   (TBD)    Frequency and Duration min 2x/week  2 weeks   Pertinent Vitals/Pain WDL         Swallow Study  Oral/Motor/Sensory Function Overall Oral Motor/Sensory Function: Appears within functional limits for tasks assessed   Ice Chips Ice chips: Within functional limits Presentation: Spoon   Thin Liquid Thin Liquid: Within functional limits Presentation: Cup;Straw    Nectar Thick Nectar Thick Liquid: Not tested   Honey Thick Honey Thick Liquid: Not tested   Puree Puree: Within functional limits   Solid   GO    Solid: Impaired Oral Phase Impairments: Reduced lingual movement/coordination Oral  Phase Functional Implications:  (delayed transit) Pharyngeal Phase Impairments: Suspected delayed Swallow       Royce Macadamia M.Ed [employer] [phone]  01/08/2013

## 2013-01-08 NOTE — Progress Notes (Signed)
Routine EEG started, converted to LTM pr Dr Leroy Kennedy

## 2013-01-08 NOTE — Progress Notes (Signed)
Stroke Team Progress Note  HISTORY [name] is an 79 y.o. male who is residing at the [facility] post surgery for a right Femoral Neck Fracture on 12/26/2012. Patient was unable to provide any history on admission, and everything was obtained from the chart. It seems the patient suffered 2 seizures at the facility on 01/06/2013 and was sent to the ED. His son had spoken to his father earlier in the day and he was not making sense, so he went to the facility and while he was there his father was witnessed to have a grand- mal seizure that lasted about 15 minutes. EMS was called and he was taken to the ED. There he had another seizure that reportedly lasted 8 minutes. He was medicated with 1 mg of IV Ativan once.   Date last known well: Unable to determine  Time last known well: Unable to determine  tPA Given: No: LKW unable to be determined, low NIHSS   SUBJECTIVE Some confusion. He follows only some commands. Dilantin and Tegretol as prescribed at the [facility] levels were subtherapeutic on admission.   OBJECTIVE Most recent Vital Signs: Filed Vitals:   01/07/13 1600 01/07/13 2023 01/08/13 0000 01/08/13 0554  BP: 183/75 152/94 147/58 171/63  Pulse: 70 80 73 83  Temp: 98.4 F (36.9 C) 98.3 F (36.8 C) 97.8 F (36.6 C) 97.9 F (36.6 C)  TempSrc: Oral Oral Oral Oral  Resp: 18 16 20 16   Height:      Weight:      SpO2: 98% 95% 96% 96%   CBG (last 3)   Recent Labs  01/08/13 0024 01/08/13 0445 01/08/13 0802  GLUCAP 189* 174* 152*    IV Fluid Intake:   . sodium chloride 50 mL/hr at 01/07/13 1328    MEDICATIONS  . aspirin  81 mg Oral Daily  . carbamazepine  400 mg Oral QHS  . clopidogrel  75 mg Oral Q breakfast  . enoxaparin (LOVENOX) injection  40 mg Subcutaneous Q24H  . insulin aspart  0-9 Units Subcutaneous Q4H  . metoprolol  5 mg Intravenous Q6H  . pantoprazole (PROTONIX) IV  40 mg Intravenous Q24H  . phenytoin  200 mg  Oral BID  . sodium chloride  3 mL Intravenous Q12H   PRN:  acetaminophen, acetaminophen, alum & mag hydroxide-simeth, hydrALAZINE, HYDROmorphone (DILAUDID) injection, LORazepam, ondansetron (ZOFRAN) IV, ondansetron, senna-docusate, zolpidem  Diet:  NPO --seizing now, speech eval pending Activity:  Bedrest DVT Prophylaxis:  lovenox  CLINICALLY SIGNIFICANT STUDIES Basic Metabolic Panel:   Recent Labs Lab 01/07/13 0930 01/08/13 0536  NA 135 134*  K 5.0 4.3  CL 103 100  CO2 24 21  GLUCOSE 219* 188*  BUN 20 17  CREATININE 1.06 1.02  CALCIUM 8.8 8.3*   Liver Function Tests:   Recent Labs Lab 01/06/13 2137  AST 17  ALT 18  ALKPHOS 107  BILITOT 0.2*  PROT 6.7  ALBUMIN 2.8*   CBC:   Recent Labs Lab 01/06/13 2137 01/07/13 0930 01/08/13 0536  WBC 14.6* 11.3* 11.0*  NEUTROABS 11.6*  --   --   HGB 10.2* 10.3* 9.7*  HCT 30.6* 30.1* 29.3*  MCV 94.7 94.7 95.1  PLT 650* 609* 608*   Coagulation: No results found for this basename: LABPROT, INR,  in the last 168 hours Cardiac Enzymes: No results found for this basename: CKTOTAL, CKMB, CKMBINDEX, TROPONINI,  in the last 168 hours Urinalysis:   Recent Labs Lab 01/06/13 2222  COLORURINE YELLOW  LABSPEC 1.018  PHURINE 7.0  GLUCOSEU >1000*  HGBUR NEGATIVE  BILIRUBINUR NEGATIVE  KETONESUR NEGATIVE  PROTEINUR NEGATIVE  UROBILINOGEN 0.2  NITRITE NEGATIVE  LEUKOCYTESUR NEGATIVE   Lipid Panel    Component Value Date/Time   CHOL 115 01/07/2013 0445   TRIG 111 01/07/2013 0445   HDL 39* 01/07/2013 0445   CHOLHDL 2.9 01/07/2013 0445   VLDL 22 01/07/2013 0445   LDLCALC 54 01/07/2013 0445   HgbA1C  Lab Results  Component Value Date   HGBA1C 7.0* 01/07/2013    Urine Drug Screen:   No results found for this basename: labopia,  cocainscrnur,  labbenz,  amphetmu,  thcu,  labbarb    Alcohol Level: No results found for this basename: ETH,  in the last 168 hours  Dg Chest 2 View 01/07/2013    Mild bibasilar  atelectasis, improved as compared to prior radiograph. No acute cardiopulmonary process identified.      Ct Head Wo Contrast 01/06/2013    1. 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 2. No acute cortical abnormality to explain seizures.     Dg Chest Portable 1 View 01/06/2013    No acute findings     MRI of the brain  pending  MRA of the brain  pending  2D Echocardiogram   EF 65%. Wall motion normal, no cardiac source of emboli was identified.  Carotid Doppler   Bilateral: 1-39% ICA stenosis. Vertebral artery flow is antegrade.   EKG sinus rhythm rate 92 beats per minute  Therapy Recommendations   Neurologic Examination:   Mental Status:  Alert. Not fully oriented. Does not know why he is here. Speech fluent without evidence of aphasia. Has difficulty following even some one step commands and unalble to follow 3-step commands.  Cranial Nerves:  II: Discs flat bilaterally; Visual fields grossly normal, pupils equal, round, reactive to light and accommodation  III,IV, VI: ptosis not present, extra-ocular motions intact bilaterally  V,VII: smile symmetric, facial light touch sensation normal bilaterally  VIII: hearing normal bilaterally  IX,X: gag reflex present  XI: bilateral shoulder shrug  XII: midline tongue extension  Motor:  Right : Upper extremity 5/5 Left: Upper extremity 5/5  Lower extremity 4/5 hip flexor strength, 5/5 otherwise Lower extremity 5/5  Tone and bulk:normal tone throughout; no atrophy noted  Sensory: Pinprick and light touch intact throughout, bilaterally  Deep Tendon Reflexes: 2+ in the upper extremities, trace at the knees and absent at the ankles.  Plantars:  Right: upgoing Left: upgoing  Cerebellar:  normal finger-to-nose. Heel-to-shin testing unable to be performed  Gait: Unable to test  CV: pulses palpable throughout    ASSESSMENT [name] is a 79 y.o. male presenting with nonsensical speech  and seizures. TPA was not given as a time of onset was uncertain and the patient had a low NIH score. 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 Infarct felt to be thrombotic secondary to small vessel disease and age indeterminant and not the cause of his presentation which is likely due to breakthru seizures in setting of UTI and not getting his AEDs. On aspirin 325 mg orally every day and clopidogrel 75 mg orally every day prior to admission. Now on aspirin 81 mg orally every day and clopidogrel 75 mg orally every day for secondary stroke prevention. Patient with resultant mild confusion and mild right lower extremity weakness.Marland Kitchen  Right femoral neck fracture with surgical repair 12/26/2012.  Diabetes mellitus - hemoglobin A1c 7.0  Hypertension  Hyperlipidemia - Zocor prior to admission - cholesterol 115 LDL 54  Coronary artery disease  Long history of seizures - on Dilantin and Tegretol prior to admission but admission levels were subtherapeutic.  Anemia no doubt related to recent surgery  Leukocytosis  Hospital day # 2  TREATMENT/PLAN  Continue aspirin 81 mg orally every day and clopidogrel 75 mg orally every day for secondary stroke prevention.  Await EEG, MRI/MRA  Restart statin  Await therapy evaluations  Speech eval pending  Risk factor modification  Gwendolyn Lima. Manson Passey, [facility], MBA, MHA [facility] Pager: [phone] 01/08/2013 8:43 AM  ADDENDUM Nursing floor called that patient was in status epilepticus after our visit. Neurology service was apparently called from my understanding.. I have discussed this with Dr. Pearlean Brownie. We have asked that the Neuro[facility] take over care. They have assumed neurological care.  I have personally obtained a history, examined the patient, evaluated imaging results, and formulated the assessment and plan of care. I agree with the above.  Delia Heady, MD

## 2013-01-08 NOTE — Progress Notes (Signed)
Stroke team has seen patient earlier this AM.  At that time patient showed confusion . Patient has not changed in exam, with EEG showing below.   EEG in progress --EEG shows rhythmic general polyspike and wave.  Patient given 2 mg Ativan, loaded with 500 mg Dilantin and 1 gram of Keppra. Will keep EEG in progress and follow.  Dilantin level to be drawn at 1500 hours and 0500 hours tomorrow AM.   Felicie Morn PA-C [facility] [phone]  01/08/2013, 11:09 AM

## 2013-01-08 NOTE — Progress Notes (Signed)
Clinical Social Work Department BRIEF PSYCHOSOCIAL ASSESSMENT 01/08/2013  Patient:  [name], [name]     Account Number:  [id number]     Admit date:  01/06/2013  Clinical Social Worker:  Harless Nakayama  Date/Time:  01/08/2013 11:45 AM  Referred by:  Physician  Date Referred:  01/08/2013 Referred for  SNF Placement   Other Referral:   Interview type:  Family Other interview type:   Spoke with pt daughter over the phone    PSYCHOSOCIAL DATA Living Status:  FACILITY Admitted from facility:  [facility] [place] Level of care:  Skilled Nursing Facility Primary support name:  Dieter Hane [phone] Primary support relationship to patient:  CHILD, ADULT Degree of support available:   Pt has supportive family    CURRENT CONCERNS Current Concerns  Post-Acute Placement   Other Concerns:    SOCIAL WORK ASSESSMENT / PLAN CSW aware that pt was admitted from facility. CSW worked with pt during recent hospital stay so aware that pt was admitted from [facility]. CSW called pt daughter and confimred plan will be to return to facility when medically stable. Pt said both she and pt are agreeable to this. CSW notified facility.   Assessment/plan status:  Psychosocial Support/Ongoing Assessment of Needs Other assessment/ plan:   Information/referral to community resources:   None needed    PATIENT'S/FAMILY'S RESPONSE TO PLAN OF CARE: Pt family agreeable for plan to return to SNF.       Wylder Macomber, LCSWA [phone]

## 2013-01-08 NOTE — Progress Notes (Signed)
PT Cancellation Note  Patient Details Name: [name] MRN: [id number] DOB: Oct 06, 1928   Cancelled Treatment:    Reason Eval/Treat Not Completed: Medical issues which prohibited therapy;Patient at procedure or test/unavailable;Other (comment) (pt hooked up to continuous EEG and per technician will be for the majority of the day.  Pt is also still on bedrest and this will have to be changed to an activity order before PT can proceeded with the evaluation.  PT will attempt to check back later today or tomorrow.    Thanks,   Rollene Rotunda. Robbert Langlinais, PT, DPT [phone]   01/08/2013, 10:36 AM

## 2013-01-08 NOTE — Progress Notes (Signed)
PT Cancellation Note  Patient Details Name: [name] MRN: [id number] DOB: October 02, 1928   Cancelled Treatment:    Reason Eval/Treat Not Completed: Patient at procedure or test/unavailable: pt still connected to EEG.  PT to check back tomorrow.     Rollene Rotunda Karene Bracken, PT, DPT [phone]   01/08/2013, 4:24 PM

## 2013-01-08 NOTE — Progress Notes (Signed)
[facility]                                                                                Patient Demographics  [name], is a 79 y.o. male, DOB - 02-07-1929, JYN:[id number]  Admit date - 01/06/2013   Admitting Physician Gregory Parker, MD  Outpatient Primary MD for the patient is Gregory Hoit, MD  LOS - 2   Chief Complaint  Patient presents with  . Seizures        Assessment & Plan    1. 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 He is being seen by neuro, will do full CVA workup pending MRI MRA brain, A1c is high,  stable carotid duplex, echogram and lipid panel, he will be seen by PT-OT-speech. He was on dual antiplatelet therapy of aspirin Plavix which will be continued, neurology on board and following.  Lab Results  Component Value Date   HGBA1C 7.0* 01/07/2013    Lab Results  Component Value Date   CHOL 115 01/07/2013   HDL 39* 01/07/2013   LDLCALC 54 01/07/2013   TRIG 111 01/07/2013   CHOLHDL 2.9 01/07/2013      2. Dyslipidemia. Home dose statin will be resumed once cleared by speech for oral diet.     3. Hypertension. Hold oral hypertension medications and allow for permissive hypertension secondary to CVA.     4. Seizures. Threshold could have been lowered due to acute CVA however his Dilantin level was subtherapeutic, extra dose Dilantin given. We'll switch him to IV Dilantin his by mouth status is clarified.     5. Diabetes mellitus type 2. Check A1c, reduce home dose Lantus as NPO , sliding scale insulin.   Lab Results  Component Value Date   HGBA1C 7.0* 01/07/2013    CBG (last 3)   Recent Labs  01/08/13 0024 01/08/13 0445 01/08/13 0802  GLUCAP 189* 174* 152*      6. Recent Right femoral neck fracture with surgical repair 12/26/2012 [name]) - PT resume, outpt Ortho follow post DC.      Code Status: Full  Family Communication:  Left message for daughter  on 01-08-13 @ 10am  Disposition Plan: Rehabilitation     Procedures CT head, MRI/MRA brain, echogram, carotid duplex   Consults  neuro   Medications  Scheduled Meds: . aspirin  81 mg Oral Daily  . carbamazepine  400 mg Oral QHS  . clopidogrel  75 mg Oral Q breakfast  . enoxaparin (LOVENOX) injection  40 mg Subcutaneous Q24H  . insulin aspart  0-9 Units Subcutaneous Q4H  . metoprolol  5 mg Intravenous Q6H  . pantoprazole (PROTONIX) IV  40 mg Intravenous Q24H  . phenytoin  200 mg Oral BID  . simvastatin  40 mg Oral q1800  . sodium chloride  3 mL Intravenous Q12H   Continuous Infusions: . sodium chloride 50 mL/hr at 01/07/13 1328   PRN Meds:.acetaminophen, acetaminophen, alum & mag hydroxide-simeth, hydrALAZINE, HYDROmorphone (DILAUDID) injection, LORazepam, ondansetron (ZOFRAN) IV, ondansetron, senna-docusate, zolpidem  DVT Prophylaxis  Lovenox   Lab Results  Component Value Date   PLT 608* 01/08/2013    Antibiotics  Anti-infectives   None          Subjective:   [name] today has no headache, no chest - abd pain, no SOB , follows some basic commands, appears to be in no distress.  Objective:   Filed Vitals:   01/07/13 1600 01/07/13 2023 01/08/13 0000 01/08/13 0554  BP: 183/75 152/94 147/58 171/63  Pulse: 70 80 73 83  Temp: 98.4 F (36.9 C) 98.3 F (36.8 C) 97.8 F (36.6 C) 97.9 F (36.6 C)  TempSrc: Oral Oral Oral Oral  Resp: 18 16 20 16   Height:      Weight:      SpO2: 98% 95% 96% 96%    Wt Readings from Last 3 Encounters:  01/07/13 87.317 kg (192 lb 8 oz)  01/03/13 89.812 kg (198 lb)  12/28/12 90.266 kg (199 lb)    No intake or output data in the 24 hours ending 01/08/13 1001  Exam Awake but not alert, answers basic questions, there is some left-sided weakness [place].AT,PERRAL Supple Neck,No JVD, No cervical lymphadenopathy appriciated.  Symmetrical Chest wall movement, Good air movement bilaterally, CTAB RRR,No Gallops,Rubs or  new Murmurs, No Parasternal Heave +ve B.Sounds, Abd Soft, Non tender, No organomegaly appriciated, No rebound - guarding or rigidity. No Cyanosis, Clubbing or edema, No new Rash or bruise     Data Review   Micro Results No results found for this or any previous visit (from the past 240 hour(s)).  Radiology Reports Dg Chest 2 View  01/07/2013   CLINICAL DATA:  Stroke  EXAM: CHEST  2 VIEW  COMPARISON:  In prior radiograph from 01/06/2013  FINDINGS: Median sternotomy wires with underlying CABG markers are unchanged. Cardiac and mediastinal silhouettes are stable.  Lungs are mildly hypoinflated. Bibasilar linear and patchy opacities are improved as compared to the prior exam, and are most consistent with atelectasis. No pulmonary edema or pleural effusion. No pneumothorax. No focal infiltrates identified.  Osseous structures are unchanged.  IMPRESSION: Mild bibasilar atelectasis, improved as compared to prior radiograph. No acute cardiopulmonary process identified.   Electronically Signed   By: Rise Mu M.D.   On: 01/07/2013 01:45    Ct Head Wo Contrast  01/06/2013   CLINICAL DATA:  Seizures  EXAM: CT HEAD WITHOUT CONTRAST  TECHNIQUE: Contiguous axial images were obtained from the base of the skull through the vertex without intravenous contrast.  COMPARISON:  12/26/2012  FINDINGS: Skull and Sinuses:No significant abnormality.  Orbits: Bilateral cataract resection.  Brain: New small (1cm) area of low-attenuation in the anterior limb right internal capsule, also involving the caudate head. Remote lacunar infarct in the anterior left thalamus, near the genu of internal capsule. No hemorrhage or hydrocephalus, no evidence of mass lesion. Cerebral and cerebellar volume loss.  These results were called by telephone at the time of interpretation on 01/06/2013 at 10:58 PM to Dr. Francee Ayers , who verbally acknowledged these results.  IMPRESSION: 1. Small perforator infarct to the right  basal ganglia, new since 12/26/2012. 2. No acute cortical abnormality to explain seizures.   Electronically Signed   By: Tiburcio Pea M.D.   On: 01/06/2013 22:59    Dg Chest Portable 1 View  01/06/2013   CLINICAL DATA:  Seizure with altered mental status  EXAM: PORTABLE CHEST - 1 VIEW  COMPARISON:  12/25/2012  FINDINGS: Status post CABG. Heart size upper normal. Vascular pattern normal. Retrocardiac area not well evaluated on single AP view. Lungs otherwise clear.  IMPRESSION: No acute findings  Electronically Signed   By: Esperanza Heir M.D.   On: 01/06/2013 22:10       CBC  Recent Labs Lab 01/06/13 2137 01/07/13 0930 01/08/13 0536  WBC 14.6* 11.3* 11.0*  HGB 10.2* 10.3* 9.7*  HCT 30.6* 30.1* 29.3*  PLT 650* 609* 608*  MCV 94.7 94.7 95.1  MCH 31.6 32.4 31.5  MCHC 33.3 34.2 33.1  RDW 13.0 12.8 12.9  LYMPHSABS 1.9  --   --   MONOABS 0.9  --   --   EOSABS 0.3  --   --   BASOSABS 0.0  --   --     Chemistries   Recent Labs Lab 01/06/13 2137 01/07/13 0930 01/08/13 0536  NA 128* 135 134*  K 5.5* 5.0 4.3  CL 95* 103 100  CO2 23 24 21   GLUCOSE 336* 219* 188*  BUN 24* 20 17  CREATININE 1.08 1.06 1.02  CALCIUM 8.5 8.8 8.3*  AST 17  --   --   ALT 18  --   --   ALKPHOS 107  --   --   BILITOT 0.2*  --   --    ------------------------------------------------------------------------------------------------------------------ estimated creatinine clearance is 57.7 ml/min (by C-G formula based on Cr of 1.02). ------------------------------------------------------------------------------------------------------------------  Recent Labs  01/07/13 0445  HGBA1C 7.0*   ------------------------------------------------------------------------------------------------------------------  Recent Labs  01/07/13 0445  CHOL 115  HDL 39*  LDLCALC 54  TRIG 161  CHOLHDL 2.9    ------------------------------------------------------------------------------------------------------------------ No results found for this basename: TSH, T4TOTAL, FREET3, T3FREE, THYROIDAB,  in the last 72 hours ------------------------------------------------------------------------------------------------------------------  Recent Labs  01/07/13 0445  VITAMINB12 1392*  FOLATE 19.6  FERRITIN 164  TIBC 186*  IRON 52  RETICCTPCT 2.3    Coagulation profile No results found for this basename: INR, PROTIME,  in the last 168 hours  No results found for this basename: DDIMER,  in the last 72 hours  Cardiac Enzymes No results found for this basename: CK, CKMB, TROPONINI, MYOGLOBIN,  in the last 168 hours ------------------------------------------------------------------------------------------------------------------ No components found with this basename: POCBNP,      Time Spent in minutes  35   Genessa Beman K M.D on 01/08/2013 at 10:01 AM  Between 7am to 7pm - Pager - [phone]  After 7pm go to [email or web address] - password [email or web address]  And look for the night coverage person covering for me after hours  [facility] Office  [phone]

## 2013-01-09 DIAGNOSIS — R569 Unspecified convulsions: Secondary | ICD-10-CM

## 2013-01-09 LAB — BASIC METABOLIC PANEL
BUN: 14 mg/dL (ref 6–23)
Calcium: 8.4 mg/dL (ref 8.4–10.5)
Chloride: 99 mEq/L (ref 96–112)
Creatinine, Ser: 1.09 mg/dL (ref 0.50–1.35)
GFR calc non Af Amer: 61 mL/min — ABNORMAL LOW (ref >90)
Glucose, Bld: 147 mg/dL — ABNORMAL HIGH (ref 70–99)
Sodium: 134 mEq/L — ABNORMAL LOW (ref 135–145)

## 2013-01-09 LAB — GLUCOSE, CAPILLARY
Glucose-Capillary: 102 mg/dL — ABNORMAL HIGH (ref 70–99)
Glucose-Capillary: 140 mg/dL — ABNORMAL HIGH (ref 70–99)
Glucose-Capillary: 179 mg/dL — ABNORMAL HIGH (ref 70–99)
Glucose-Capillary: 209 mg/dL — ABNORMAL HIGH (ref 70–99)
Glucose-Capillary: 254 mg/dL — ABNORMAL HIGH (ref 70–99)

## 2013-01-09 MED ORDER — CARVEDILOL 25 MG PO TABS
25.0000 mg | ORAL_TABLET | Freq: Two times a day (BID) | ORAL | Status: DC
  Administered 2013-01-09 – 2013-01-10 (×2): 25 mg via ORAL
  Filled 2013-01-09 (×4): qty 1

## 2013-01-09 MED ORDER — TAMSULOSIN HCL 0.4 MG PO CAPS
0.4000 mg | ORAL_CAPSULE | Freq: Every day | ORAL | Status: DC
  Administered 2013-01-09: 0.4 mg via ORAL
  Filled 2013-01-09 (×2): qty 1

## 2013-01-09 MED ORDER — INSULIN GLARGINE 100 UNIT/ML ~~LOC~~ SOLN
20.0000 [IU] | Freq: Every day | SUBCUTANEOUS | Status: DC
  Administered 2013-01-09 – 2013-01-10 (×2): 20 [IU] via SUBCUTANEOUS
  Filled 2013-01-09 (×2): qty 0.2

## 2013-01-09 MED ORDER — HYDROCODONE-ACETAMINOPHEN 5-325 MG PO TABS
1.0000 | ORAL_TABLET | Freq: Four times a day (QID) | ORAL | Status: DC | PRN
  Administered 2013-01-09 – 2013-01-10 (×2): 2 via ORAL
  Administered 2013-01-10: 1 via ORAL
  Filled 2013-01-09: qty 2
  Filled 2013-01-09: qty 1
  Filled 2013-01-09: qty 2

## 2013-01-09 MED ORDER — INSULIN ASPART 100 UNIT/ML ~~LOC~~ SOLN
0.0000 [IU] | Freq: Every day | SUBCUTANEOUS | Status: DC
  Administered 2013-01-09: 2 [IU] via SUBCUTANEOUS

## 2013-01-09 MED ORDER — INSULIN ASPART 100 UNIT/ML ~~LOC~~ SOLN
0.0000 [IU] | Freq: Three times a day (TID) | SUBCUTANEOUS | Status: DC
  Administered 2013-01-09: 5 [IU] via SUBCUTANEOUS
  Administered 2013-01-09: 2 [IU] via SUBCUTANEOUS
  Administered 2013-01-10: 3 [IU] via SUBCUTANEOUS

## 2013-01-09 NOTE — Progress Notes (Signed)
LTM EEG D/C'd this morning.

## 2013-01-09 NOTE — Progress Notes (Signed)
OT Cancellation Note  Patient Details Name: [name] MRN: [id number] DOB: [birth year]   Cancelled Treatment:    Reason Eval/Treat Not Completed: Medical issues which prohibited therapy (Will see when activity orders updated. Sent text to MD.)  [facility] Hades Mathew, OTR/L  [phone] 01/09/2013 01/09/2013, 11:17 AM

## 2013-01-09 NOTE — Progress Notes (Signed)
Occupational Therapy Evaluation Patient Details Name: [name] MRN: [id number] DOB: Dec 09, 1928 Today's Date: 01/09/2013 Time: 4098-1191 OT Time Calculation (min): 32 min  OT Assessment / Plan / Recommendation History of present illness Pt is an 79 y/o male admitted s/p R hip hemiarthroplasty. D/C to [facility] for rehab. Admitted over the weekend for MS changes and seizures. ? sm BG CVA.    Clinical Impression   No focal neurologic deficits noted during eval. Pt making good progress. Ready to transfer to SNF for rehab when medically stable.     OT Assessment  All further OT needs can be met in the next venue of care    Follow Up Recommendations  SNF    Barriers to Discharge      Equipment Recommendations  None recommended by OT    Recommendations for Other Services    Frequency       Precautions / Restrictions Precautions Precautions: Fall;Posterior Hip Precaution Comments: Able to recall 0/3 precautions  Restrictions RLE Weight Bearing: Weight bearing as tolerated   Pertinent Vitals/Pain no apparent distress     ADL  Eating/Feeding: Independent Grooming: Set up Where Assessed - Grooming: Supported sitting Upper Body Bathing: Set up Where Assessed - Upper Body Bathing: Supported sitting Lower Body Bathing: Moderate assistance Where Assessed - Lower Body Bathing: Supported sit to stand Upper Body Dressing: Supervision/safety;Set up Where Assessed - Upper Body Dressing: Unsupported sitting Lower Body Dressing: Moderate assistance Where Assessed - Lower Body Dressing: Supported sit to Pharmacist, hospital: [employer] Method: Sit to stand;Stand pivot Equipment Used: Rolling walker;Gait belt Transfers/Ambulation Related to ADLs: Min A ADL Comments: Pt did not recall posterior hip precautions, but did state WBAT    OT Diagnosis: Generalized weakness  OT Problem List: Decreased strength;Decreased activity  tolerance;Decreased knowledge of precautions;Decreased knowledge of use of DME or AE OT Treatment Interventions:     OT Goals(Current goals can be found in the care plan section) Acute Rehab OT Goals Patient Stated Goal: Go home OT Goal Formulation:  (eval only)  Visit Information  Last OT Received On: 01/09/13 Assistance Needed: +1 Reason Eval/Treat Not Completed: Medical issues which prohibited therapy (Will see when activity orders updated. Sent text to MD.) History of Present Illness: Pt is an 79 y/o male admitted s/p R hip hemiarthroplasty. D/C to [facility] for rehab. Admitted over the weekend for MS changes and seizures. ? sm BG CVA.        Prior Functioning     Home Living Family/patient expects to be discharged to:: Skilled nursing facility Living Arrangements: Children         Vision/Perception Vision - History Baseline Vision: No visual deficits   Cognition  Cognition Behavior During Therapy: WFL for tasks assessed/performed Overall Cognitive Status: No family/caregiver present to determine baseline cognitive functioning Memory: Decreased short-term memory;Decreased recall of precautions (Did not mention posterior hip precuaitons)    Extremity/Trunk Assessment Upper Extremity Assessment Upper Extremity Assessment: Overall WFL for tasks assessed Lower Extremity Assessment Lower Extremity Assessment: Defer to PT evaluation Cervical / Trunk Assessment Cervical / Trunk Assessment: Normal     Mobility Bed Mobility Bed Mobility: Supine to Sit Supine to Sit: With rails;HOB elevated;5: Supervision Sitting - Scoot to Edge of Bed: 5: Supervision Details for Bed Mobility Assistance: assist for R LE Transfers Transfers: Sit to Stand;Stand to Sit Sit to Stand: 4: Min assist Stand to Sit: 4: Min assist     Exercise Other Exercises Other Exercises: Encouraged BLE  and UE ARONM while sitting in chair with legs down   Balance     End of Session OT - End of  Session Equipment Utilized During Treatment: Gait belt;Rolling walker Activity Tolerance: Patient tolerated treatment well Patient left: in chair;with call bell/phone within reach;with nursing/sitter in room Nurse Communication: Mobility status;Other (comment) (need for chair alarm) due to recent seizure activity  GO     [name],HILLARY 01/09/2013, 12:02 PM [facility], OTR/L  [phone] 01/09/2013

## 2013-01-09 NOTE — Progress Notes (Signed)
NEURO HOSPITALIST PROGRESS NOTE   SUBJECTIVE:                                                                                                                        Doing well this morning. Mental status seems to be appropriate. No neurological complains. Continuous EEG reveals no further seizures. Dilantin level 15.6   OBJECTIVE:                                                                                                                           Vital signs in last 24 hours: Temp:  [98.2 F (36.8 C)-98.9 F (37.2 C)] 98.4 F (36.9 C) (11/25 0400) Pulse Rate:  [74-83] 77 (11/25 0400) Resp:  [18-20] 20 (11/25 0400) BP: (113-157)/(59-76) 129/66 mmHg (11/25 0400) SpO2:  [97 %-100 %] 99 % (11/25 0400)  Intake/Output from previous day: 11/24 0701 - 11/25 0700 In: -  Out: 1450 [Urine:1450] Intake/Output this shift:   Nutritional status: Dysphagia  Past Medical History  Diagnosis Date  . CAD (coronary artery disease)     s/p cabg 3/10...s/p MI age 72  . Diabetes mellitus type II   . HTN (hypertension)   . HLD (hyperlipidemia)   . Anxiety disorder   . Tremor   . GERD (gastroesophageal reflux disease)   . Pulmonary vein stenosis   . Angina   . Chronic kidney disease   . Anxiety   . Myocardial infarction 1970's; 2010  . Aspiration pneumonia     History of aspiration pneumonia with vent-dependent respiratory/notes 06/12/2008 (12/26/2012)  . Orthopnoea     "just recently" (12/26/2012)  . History of stomach ulcers     "long time ago" (12/26/2012)  . Seizures     "I've had them all my life; last one was 04/2008" (12/26/2012)  . Chronic lower back pain   . Depression     "used to have problems w/depression" (12/26/2012)  . Fall at home 12/25/2012    "stumbled over cord while I was blowing leaves; broke my right hip" (12/26/2012)    Neurologic Exam:  Mental Status:  Alert, oriented. Marland Kitchen Speech fluent without evidence of aphasia.  Able to follow 3-step commands.  Cranial Nerves:  II: Discs flat bilaterally; Visual fields  grossly normal, pupils equal, round, reactive to light and accommodation  III,IV, VI: ptosis not present, extra-ocular motions intact bilaterally  V,VII: smile symmetric, facial light touch sensation normal bilaterally  VIII: hearing normal bilaterally  IX,X: gag reflex present  XI: bilateral shoulder shrug  XII: midline tongue extension  Motor:  Right : Upper extremity 5/5 Left: Upper extremity 5/5  Lower extremity 4/5 hip flexor strength, 5/5 otherwise Lower extremity 5/5  Tone and bulk:normal tone throughout; no atrophy noted  Sensory: Pinprick and light touch intact throughout, bilaterally  Deep Tendon Reflexes: 2+ in the upper extremities, trace at the knees and absent at the ankles.  Plantars:  Right: upgoing Left: upgoing  Cerebellar:  normal finger-to-nose. Heel-to-shin testing unable to be performed  Gait: Unable to test  CV: pulses palpable throughout    Lab Results: Lab Results  Component Value Date/Time   CHOL 115 01/07/2013  4:45 AM   Lipid Panel  Recent Labs  01/07/13 0445  CHOL 115  TRIG 111  HDL 39*  CHOLHDL 2.9  VLDL 22  LDLCALC 54    Studies/Results: No results found.  MEDICATIONS                                                                                                                       I have reviewed the patient's current medications.  ASSESSMENT/PLAN:                                                                                                           PC NCSE, resolved. Continue current anticonvulsants. Will discontinue EEG monitoring. Will follow up.  Wyatt Portela, MD [facility] [phone]  01/09/2013, 7:32 AM

## 2013-01-09 NOTE — Progress Notes (Signed)
[facility]                                                                                Patient Demographics  [name], is a 79 y.o. male, DOB - [birth year], ZOX:[id number]  Admit date - 01/06/2013   Admitting Physician Ron Parker, MD  Outpatient Primary MD for the patient is [name], MD  LOS - 3   Chief Complaint  Patient presents with  . Seizures      Brief Summary  This 79 year old Caucasian male with history of seizures who was in Dilantin at home, recent trip and fall at home causing right femoral neck fracture status post surgical repair on 12/26/2012 by Dr. [name], was admitted from nursing home for decreased mental status, his presentation was consistent with seizure along with possible small acute CVA. Head CT confirmed a small CVA and EEG confirmed him to be in status epilepticus. Neurology is following the patient. He is getting IV Dilantin along with Keppra which was recommended by neurology, he's due for MRI MRA of the brain today. His mental status is much improved. If stable and workup is complete he might be able to go back to his rehabilitation facility in 1-2 days.   Assessment & Plan    1. 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 He is being seen by neuro, will do full CVA workup pending MRI MRA brain, A1c is high,  stable carotid duplex, echogram and lipid panel, he is been seen by PT-OT-speech. He was on dual antiplatelet therapy of aspirin Plavix which will be continued, neurology on board and following.  Lab Results  Component Value Date   HGBA1C 7.0* 01/07/2013    Lab Results  Component Value Date   CHOL 115 01/07/2013   HDL 39* 01/07/2013   LDLCALC 54 01/07/2013   TRIG 111 01/07/2013   CHOLHDL 2.9 01/07/2013      2. Dyslipidemia. Home dose statin will be resumed once cleared by speech for oral diet.     3. Hypertension. Adjust blood pressure medications for better  control as he is out of his 48 hour acute ischemic CVA window.     4. Status epilepticus in a patient with history of seizures-Threshold could have been lowered due to acute CVA however his Dilantin level was subtherapeutic, extra dose Dilantin given. Pharmacy dosing Dilantin, IV Keppra started by neurology on 01/08/2013, he is undergoing continuous EEG monitoring which I think can be stopped today as he appears stable. Thereafter can proceed with MRI MRA brain.     5. Diabetes mellitus type 2. Check A1c, now that he is on diet will increase Lantus to home dose and continue sliding scale insulin.   Lab Results  Component Value Date   HGBA1C 7.0* 01/07/2013    CBG (last 3)   Recent Labs  01/08/13 2339 01/09/13 0428 01/09/13 0745  GLUCAP 179* 102* 140*      6. Recent Right femoral neck fracture with surgical repair 12/26/2012 [name]) - PT resume, outpt Ortho follow post DC. He has an appointment with his orthopedic surgeon in the first week of December.      Code  Status: Full  Family Communication:  Discussed with daughter in detail on 01/08/2013  Disposition Plan: Rehabilitation     Procedures CT head, MRI/MRA brain, echogram, carotid duplex   Consults  neuro   Medications  Scheduled Meds: . aspirin  81 mg Oral Daily  . carbamazepine  400 mg Oral QHS  . clopidogrel  75 mg Oral Q breakfast  . enoxaparin (LOVENOX) injection  40 mg Subcutaneous Q24H  . insulin aspart  0-9 Units Subcutaneous Q4H  . LORazepam  2 mg Intravenous On Call  . pantoprazole (PROTONIX) IV  40 mg Intravenous Q24H  . phenytoin (DILANTIN) IV  200 mg Intravenous BID  . simvastatin  40 mg Oral q1800  . sodium chloride  3 mL Intravenous Q12H   Continuous Infusions:   PRN Meds:.acetaminophen, acetaminophen, alum & mag hydroxide-simeth, hydrALAZINE, HYDROmorphone (DILAUDID) injection, LORazepam, ondansetron (ZOFRAN) IV, ondansetron, senna-docusate, zolpidem  DVT Prophylaxis  Lovenox    Lab Results  Component Value Date   PLT 608* 01/08/2013    Antibiotics   Anti-infectives   None          Subjective:   [name] today has no headache, no chest - abd pain, no SOB , follows some basic commands, appears to be in no distress.  Objective:   Filed Vitals:   01/08/13 2048 01/08/13 2340 01/09/13 0400 01/09/13 0800  BP: 147/76 137/69 129/66 150/69  Pulse: 80 74 77 86  Temp: 98.2 F (36.8 C) 98.8 F (37.1 C) 98.4 F (36.9 C) 98.6 F (37 C)  TempSrc: Oral Oral Oral Oral  Resp: 20 20 20 18   Height:      Weight:      SpO2: 97% 98% 99% 97%    Wt Readings from Last 3 Encounters:  01/07/13 87.317 kg (192 lb 8 oz)  01/03/13 89.812 kg (198 lb)  12/28/12 90.266 kg (199 lb)     Intake/Output Summary (Last 24 hours) at 01/09/13 0840 Last data filed at 01/09/13 0300  Gross per 24 hour  Intake      0 ml  Output   1450 ml  Net  -1450 ml    Exam Awake but not alert, answers basic questions, there is some left-sided weakness [place].AT,PERRAL Supple Neck,No JVD, No cervical lymphadenopathy appriciated.  Symmetrical Chest wall movement, Good air movement bilaterally, CTAB RRR,No Gallops,Rubs or new Murmurs, No Parasternal Heave +ve B.Sounds, Abd Soft, Non tender, No organomegaly appriciated, No rebound - guarding or rigidity. No Cyanosis, Clubbing or edema, No new Rash or bruise     Data Review   Micro Results Recent Results (from the past 240 hour(s))  URINE CULTURE     Status: None   Collection Time    01/06/13 10:22 PM      Result Value Range Status   Specimen Description URINE, CATHETERIZED   Final   Special Requests NONE   Final   Culture  Setup Time     Final   Value: 01/07/2013 15:03     Performed at [facility] Count     Final   Value: NO GROWTH     Performed at [facility]   Culture     Final   Value: NO GROWTH     Performed at [facility]   Report Status 01/08/2013 FINAL   Final    Radiology  Reports Dg Chest 2 View  01/07/2013   CLINICAL DATA:  Stroke  EXAM: CHEST  2 VIEW  COMPARISON:  In prior radiograph from 01/06/2013  FINDINGS: Median sternotomy wires with underlying CABG markers are unchanged. Cardiac and mediastinal silhouettes are stable.  Lungs are mildly hypoinflated. Bibasilar linear and patchy opacities are improved as compared to the prior exam, and are most consistent with atelectasis. No pulmonary edema or pleural effusion. No pneumothorax. No focal infiltrates identified.  Osseous structures are unchanged.  IMPRESSION: Mild bibasilar atelectasis, improved as compared to prior radiograph. No acute cardiopulmonary process identified.   Electronically Signed   By: Rise Mu M.D.   On: 01/07/2013 01:45    Ct Head Wo Contrast  01/06/2013   CLINICAL DATA:  Seizures  EXAM: CT HEAD WITHOUT CONTRAST  TECHNIQUE: Contiguous axial images were obtained from the base of the skull through the vertex without intravenous contrast.  COMPARISON:  12/26/2012  FINDINGS: Skull and Sinuses:No significant abnormality.  Orbits: Bilateral cataract resection.  Brain: New small (1cm) area of low-attenuation in the anterior limb right internal capsule, also involving the caudate head. Remote lacunar infarct in the anterior left thalamus, near the genu of internal capsule. No hemorrhage or hydrocephalus, no evidence of mass lesion. Cerebral and cerebellar volume loss.  These results were called by telephone at the time of interpretation on 01/06/2013 at 10:58 PM to Dr. Francee Piccolo , who verbally acknowledged these results.  IMPRESSION: 1. 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 2. No acute cortical abnormality to explain seizures.   Electronically Signed   By: Tiburcio Pea M.D.   On: 01/06/2013 22:59    Dg Chest Portable 1 View  01/06/2013   CLINICAL DATA:  Seizure with altered mental status  EXAM: PORTABLE CHEST - 1 VIEW  COMPARISON:  12/25/2012   FINDINGS: Status post CABG. Heart size upper normal. Vascular pattern normal. Retrocardiac area not well evaluated on single AP view. Lungs otherwise clear.  IMPRESSION: No acute findings   Electronically Signed   By: Esperanza Heir M.D.   On: 01/06/2013 22:10       CBC  Recent Labs Lab 01/06/13 2137 01/07/13 0930 01/08/13 0536  WBC 14.6* 11.3* 11.0*  HGB 10.2* 10.3* 9.7*  HCT 30.6* 30.1* 29.3*  PLT 650* 609* 608*  MCV 94.7 94.7 95.1  MCH 31.6 32.4 31.5  MCHC 33.3 34.2 33.1  RDW 13.0 12.8 12.9  LYMPHSABS 1.9  --   --   MONOABS 0.9  --   --   EOSABS 0.3  --   --   BASOSABS 0.0  --   --     Chemistries   Recent Labs Lab 01/06/13 2137 01/07/13 0930 01/08/13 0536  NA 128* 135 134*  K 5.5* 5.0 4.3  CL 95* 103 100  CO2 23 24 21   GLUCOSE 336* 219* 188*  BUN 24* 20 17  CREATININE 1.08 1.06 1.02  CALCIUM 8.5 8.8 8.3*  AST 17  --   --   ALT 18  --   --   ALKPHOS 107  --   --   BILITOT 0.2*  --   --    ------------------------------------------------------------------------------------------------------------------ estimated creatinine clearance is 57.7 ml/min (by C-G formula based on Cr of 1.02). ------------------------------------------------------------------------------------------------------------------  Recent Labs  01/07/13 0445  HGBA1C 7.0*   ------------------------------------------------------------------------------------------------------------------  Recent Labs  01/07/13 0445  CHOL 115  HDL 39*  LDLCALC 54  TRIG 161  CHOLHDL 2.9   ------------------------------------------------------------------------------------------------------------------ No results found for this basename: TSH, T4TOTAL, FREET3, T3FREE, THYROIDAB,  in the last 72 hours ------------------------------------------------------------------------------------------------------------------  Recent Labs  01/07/13 0445  VITAMINB12 1392*  FOLATE 19.6  FERRITIN 164  TIBC  186*  IRON 52  RETICCTPCT 2.3    Coagulation profile No results found for this basename: INR, PROTIME,  in the last 168 hours  No results found for this basename: DDIMER,  in the last 72 hours  Cardiac Enzymes No results found for this basename: CK, CKMB, TROPONINI, MYOGLOBIN,  in the last 168 hours ------------------------------------------------------------------------------------------------------------------ No components found with this basename: POCBNP,      Time Spent in minutes  35   Susa Raring K M.D on 01/09/2013 at 8:40 AM  Between 7am to 7pm - Pager - [phone]  After 7pm go to [email or web address] - password [email or web address]  And look for the night coverage person covering for me after hours  [facility] Office  [phone]

## 2013-01-09 NOTE — Evaluation (Signed)
Physical Therapy Evaluation Patient Details Name: [name] MRN: [id number] DOB: December 31, 1928 Today's Date: 01/09/2013 Time: 4098-1191 PT Time Calculation (min): 19 min  PT Assessment / Plan / Recommendation History of Present Illness  Pt is an 79 y/o male admitted s/p R hip hemiarthroplasty. D/C to [facility] for rehab. Admitted over the weekend for MS changes and seizures. ? sm BG CVA.   Clinical Impression  Pt is progressing well with his mobility.  Right knee buckling initially with gait which improved with increased gait distance.  Initiated hip exercises to help progress his rehab of his right him hemiarthroplasty.  Pt is appropriate to return to SNF to finish his rehab at discharge.      PT Assessment  Patient needs continued PT services    Follow Up Recommendations  SNF    Does the patient have the potential to tolerate intense rehabilitation     NA  Barriers to Discharge   None      Equipment Recommendations  None recommended by PT    Recommendations for Other Services   None  Frequency Min 2X/week    Precautions / Restrictions Precautions Precautions: Fall;Posterior Hip Precaution Comments: able to report 2/3 precautions at beginning and 3/3 precautions at end of tx session Restrictions RLE Weight Bearing: Weight bearing as tolerated   Pertinent Vitals/Pain See vitals flow sheet.       Mobility  Bed Mobility Bed Mobility: Not assessed (pt seated in recliner chair) Transfers Sit to Stand: 4: Min assist;With upper extremity assist;With armrests;From chair/3-in-1 Stand to Sit: 4: Min assist;Without upper extremity assist;With armrests;To chair/3-in-1 Details for Transfer Assistance: Min assist to anteriorly weight shift trunk upon initially standing.  Ambulation/Gait Ambulation/Gait Assistance: 4: Min assist Ambulation Distance (Feet): 80 Feet Assistive device: Rolling walker Ambulation/Gait Assistance Details: min assist to steady trunk as right  knee buckles for the first 25' when WB through leg.  Per pt due to pain.   Gait Pattern: Step-to pattern;Antalgic Gait velocity: decreased General Gait Details: verbal cues for safe use of RW and no pivoting while turning around Modified Rankin (Stroke Patients Only) Pre-Morbid Rankin Score: Moderately severe disability Modified Rankin: Moderately severe disability    Exercises Total Joint Exercises Ankle Circles/Pumps: AROM;Both;10 reps;Supine Heel Slides: AAROM;Right;10 reps;Supine Hip ABduction/ADduction: AROM;Right;10 reps;Supine Long Arc Quad: AROM;Right;10 reps;Seated   PT Diagnosis: Difficulty walking;Abnormality of gait;Generalized weakness;Acute pain  PT Problem List: Decreased strength;Decreased activity tolerance;Decreased balance;Decreased mobility;Decreased knowledge of use of DME;Decreased knowledge of precautions;Pain PT Treatment Interventions: DME instruction;Gait training;Functional mobility training;Therapeutic activities;Therapeutic exercise;Balance training;Neuromuscular re-education;Patient/family education;Modalities     PT Goals(Current goals can be found in the care plan section) Acute Rehab PT Goals Patient Stated Goal: Go home PT Goal Formulation: With patient/family Time For Goal Achievement: 01/23/13 Potential to Achieve Goals: Good  Visit Information  Last PT Received On: 01/09/13 Assistance Needed: +1 History of Present Illness: Pt is an 79 y/o male admitted s/p R hip hemiarthroplasty. D/C to [facility] for rehab. Admitted over the weekend for MS changes and seizures. ? sm BG CVA.        Prior Functioning  Home Living Family/patient expects to be discharged to:: Skilled nursing facility Prior Function Level of Independence: Independent with assistive device(s) Comments: pt reports he was walking with RW without supervision at the SNF    Cognition  Cognition Arousal/Alertness: Awake/alert Behavior During Therapy: [facility] for tasks  assessed/performed Overall Cognitive Status: Within Functional Limits for tasks assessed    Extremity/Trunk Assessment Upper Extremity Assessment  Upper Extremity Assessment: Defer to OT evaluation Lower Extremity Assessment Lower Extremity Assessment: RLE deficits/detail RLE Deficits / Details: right leg buckling with WB during gait.  Normal progression of post-op strength given level of pain in right hip.        End of Session PT - End of Session Equipment Utilized During Treatment: Gait belt Activity Tolerance: Patient limited by pain Patient left: in chair;with call bell/phone within reach;with family/visitor present    [name], PT, DPT [phone]   01/09/2013, 5:17 PM

## 2013-01-09 NOTE — Progress Notes (Signed)
Speech Language Pathology Treatment: Dysphagia  Patient Details Name: [name] MRN: [id number] DOB: 08-Oct-1928 Today's Date: 01/09/2013 Time: 4098-1191 SLP Time Calculation (min): 12 min  Assessment / Plan / Recommendation Clinical Impression  Pt. More alert and lucid today.  Diet ordered under carb modified diet with Dys 2 in the comment section, therefore received regular texture versus Dys 2.  Pt.'s dentures are donned and he did not demonstrate difficulty with oral prep, mastication or transit.  No s/s aspiration with liquids.  Recommend he continue regular texture and thin liquids.  No further ST needed.   HPI HPI: 79 y.o. male who is residing at the [facility] post surgery for a right Femoral Neck Fracture on 12/26/2012 who suffered 2 seizures, and was sent to the ED. The patient is post ictal and can not give a history, but his son is at the bedside and he states that when he spoke to his father earlier in the day he was not making sense, so he went to the facility and while he was there his father was witnessed to have a grand- mal seizure that lasted about 15 minutes.  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  CXR Mild bibasilar atelectasis, improved as compared to prior radiograph. No acute cardiopulmonary process identified.    Pertinent Vitals WDL's  SLP Plan  All goals met    Recommendations Diet recommendations: Regular;Thin liquid Liquids provided via: Cup;Straw Medication Administration: Whole meds with liquid Supervision: Patient able to self feed Compensations: Slow rate;Small sips/bites Postural Changes and/or Swallow Maneuvers: Seated upright 90 degrees              Oral Care Recommendations: Oral care BID Follow up Recommendations: Skilled Nursing facility Plan: All goals met    GO     Royce Macadamia M.Ed [employer] [phone]  01/09/2013

## 2013-01-10 ENCOUNTER — Inpatient Hospital Stay (HOSPITAL_COMMUNITY): Payer: Medicare Other

## 2013-01-10 DIAGNOSIS — E1101 Type 2 diabetes mellitus with hyperosmolarity with coma: Secondary | ICD-10-CM

## 2013-01-10 DIAGNOSIS — N179 Acute kidney failure, unspecified: Secondary | ICD-10-CM

## 2013-01-10 DIAGNOSIS — R269 Unspecified abnormalities of gait and mobility: Secondary | ICD-10-CM

## 2013-01-10 LAB — PHENYTOIN LEVEL, TOTAL: Phenytoin Lvl: 14.6 ug/mL (ref 10.0–20.0)

## 2013-01-10 LAB — GLUCOSE, CAPILLARY
Glucose-Capillary: 118 mg/dL — ABNORMAL HIGH (ref 70–99)
Glucose-Capillary: 146 mg/dL — ABNORMAL HIGH (ref 70–99)

## 2013-01-10 MED ORDER — ASPIRIN 81 MG PO CHEW: 81.0000 mg | CHEWABLE_TABLET | Freq: Every day | ORAL | Status: DC

## 2013-01-10 MED ORDER — PHENYTOIN SODIUM EXTENDED 200 MG PO CAPS: 200.0000 mg | ORAL_CAPSULE | Freq: Two times a day (BID) | ORAL | Status: DC

## 2013-01-10 MED ORDER — HYDROCODONE-ACETAMINOPHEN 5-325 MG PO TABS: ORAL_TABLET | ORAL | Status: DC

## 2013-01-10 MED ORDER — PHENYTOIN SODIUM EXTENDED 100 MG PO CAPS
200.0000 mg | ORAL_CAPSULE | Freq: Two times a day (BID) | ORAL | Status: DC
Start: 2013-01-10 — End: 2013-01-10
  Administered 2013-01-10: 200 mg via ORAL
  Filled 2013-01-10 (×2): qty 2

## 2013-01-10 MED ORDER — PANTOPRAZOLE SODIUM 40 MG PO TBEC: 40.0000 mg | DELAYED_RELEASE_TABLET | Freq: Every day | ORAL | Status: DC

## 2013-01-10 MED ORDER — CARBAMAZEPINE 200 MG PO TABS: 200.0000 mg | ORAL_TABLET | Freq: Two times a day (BID) | ORAL | Status: DC

## 2013-01-10 MED ORDER — MIRTAZAPINE 15 MG PO TABS: 15.0000 mg | ORAL_TABLET | Freq: Every day | ORAL | Status: AC

## 2013-01-10 MED ORDER — CARBAMAZEPINE 200 MG PO TABS
200.0000 mg | ORAL_TABLET | Freq: Two times a day (BID) | ORAL | Status: DC
  Administered 2013-01-10: 200 mg via ORAL
  Filled 2013-01-10 (×2): qty 1

## 2013-01-10 NOTE — Progress Notes (Signed)
Pt provided with dc instructions and education. A copy of avs in shadow chart to transport to facility with patient. Report called to [facility] and they are aware to check labs on Friday. No questins at this time. IV removed with tip intact. Heart monitor cleaned and returned to frotn. Levonne Spiller, RN

## 2013-01-10 NOTE — Care Management Note (Signed)
    Page 1 of 1   01/10/2013     2:25:19 PM   CARE MANAGEMENT NOTE 01/10/2013  Patient:  EDREI, NORGAARD   Account Number:  [id number]  Date Initiated:  01/10/2013  Documentation initiated by:  GRAVES-BIGELOW,Lyna Laningham  Subjective/Objective Assessment:   Pt admitted for Status epilepticus with history of seizures. Plan to return to [facility] today.     Action/Plan:   CSW assisting with disposition needs.   Anticipated DC Date:  01/10/2013   Anticipated DC Plan:  SKILLED NURSING FACILITY  In-house referral  Clinical Social Worker      DC Planning Services  CM consult      Choice offered to / List presented to:             Status of service:  Completed, signed off Medicare Important Message given?   (If response is "NO", the following Medicare IM given date fields will be blank) Date Medicare IM given:   Date Additional Medicare IM given:    Discharge Disposition:  SKILLED NURSING FACILITY  Per UR Regulation:  Reviewed for med. necessity/level of care/duration of stay  If discussed at Long Length of Stay Meetings, dates discussed:    Comments:

## 2013-01-10 NOTE — Progress Notes (Signed)
CSW (Clinical Child psychotherapist) prepared pt dc packet and placed with shadow chart. Ambulance auth was received by [facility] and non-emergent ambulance has been called. CSW informed pt, family, facility, and nurse. CSW signing off.  Camauri Fleece, LCSWA [phone]

## 2013-01-10 NOTE — Progress Notes (Signed)
Pharmacy - Phenytoin  84yom s/p right femoral neck fracture on 12/26/12 continues on phenytoin for seizures.   Despite reported compliance at [facility] of phenytoin 300mg  po qhs, his phenytoin level was subtherapeutic on admission. He was loaded with 1000mg  IV phenytoin on 11/23 and maintenance dose of 200mg  bid was started (~4.5mg /kg/day using ABW). Subsequent levels have been therapeutic.  11/22: Phenytoin level 2.8, albumin 2.8, corrected phenytoin level = 4.24 11/24: Phenytoin level 11.7, no albumin to correct, IV keppra x 1 11/25: Phenytoin level 15.6, no albumin to correct  He is currently seizure free. MRI pending. He passed his swallow eval.  Plan: 1) Continue phenytoin 200mg  IV bid 2) Check another phenytoin level 11/28 3) Change phenytoin to PO?  Louie Casa, PharmD, BCPS 01/10/13, 7:48 AM

## 2013-01-10 NOTE — Progress Notes (Signed)
Subjective: Feels much improved  Exam: Filed Vitals:   01/10/13 0806  BP: 137/65  Pulse: 73  Temp: 97.9 F (36.6 C)  Resp: 18   Gen: In bed, NAD MS: awake, alert, interactive and appropriate, oriented x 3.  AV:WUJWJ, EOMI Motor: moves all extremities well, guards left leg slightly  Sensory:intact to LT.  Gait: uses walker(due to recent hip surgery)   Impression: 79 yo M with a history of seizures who was subtheraputic on dilanitn and tegretol. He was receiving short acting tegretol once daily which may account for this level being low, and dilantin has been increased. I would be hesitant to make too many changes at once, so would not increase tegretol dosing total per day, but would change to BID dosing.   Recommendations: 1) Dilantin 200mg  IBD 2) change tegretol from 400 qhs to 200mg  BID 3) Will need levels in a few days. 4) discussed toxicity of dilantin with patient with instruction to return to ED for increasing unsteadiness or lethargy for level.   Ritta Slot, MD [facility] [phone]  If 7pm- 7am, please page neurology on call at [phone].

## 2013-01-10 NOTE — Discharge Summary (Signed)
Physician Discharge Summary  Patient ID: ALFIO LOESCHER MRN: [id number] DOB/AGE: Jul 30, 1928 79 y.o.  Admit date: 01/06/2013 Discharge date: 01/10/2013  Primary Care Physician:  [name], MD  Final Discharge Diagnoses:   Status epilepticus with history of seizures  Acute CVA   Secondary discharge diagnosis  . Seizures . Hyponatremia . Hyperkalemia . CORONARY ATHEROSCLEROSIS NATIVE CORONARY ARTERY . HYPERTENSION, BENIGN . Diabetes mellitus type 2 in nonobese . HYPERLIPIDEMIA-MIXED . CVA (cerebral infarction)  Consults: Neurology   Recommendations for Outpatient Follow-up:  1) patient is not allowed to drive 2) please note changes in the Dilantin and Tegretol doses, obtain Dilantin and Tegretol level on 01/12/13 3) seizure precautions   Allergies:  No Known Allergies   Discharge Medications:   Medication List    STOP taking these medications       aspirin EC 325 MG tablet  Replaced by:  aspirin 81 MG chewable tablet      TAKE these medications       aspirin 81 MG chewable tablet  Chew 1 tablet (81 mg total) by mouth daily.     carbamazepine 200 MG tablet  Commonly known as:  TEGRETOL  Take 1 tablet (200 mg total) by mouth every 12 (twelve) hours.     carvedilol 25 MG tablet  Commonly known as:  COREG  Take 25 mg by mouth 2 (two) times daily with a meal.     clopidogrel 75 MG tablet  Commonly known as:  PLAVIX  Take 1 tablet (75 mg total) by mouth daily with breakfast.     Exenatide ER 2 MG Pen  Inject 2 mg into the skin See admin instructions. Take on mondays nights     HYDROcodone-acetaminophen 5-325 MG per tablet  Commonly known as:  NORCO  Take two tablets by mouth every 4 hours as needed for pain.     LANTUS SOLOSTAR 100 UNIT/ML Sopn  Generic drug:  Insulin Glargine  Inject 25 Units into the skin at bedtime.     mirtazapine 15 MG tablet  Commonly known as:  REMERON  Take 1 tablet (15 mg total) by mouth at bedtime.      pantoprazole 40 MG tablet  Commonly known as:  PROTONIX  Take 40 mg by mouth daily.     phenytoin 200 MG ER capsule  Commonly known as:  DILANTIN  Take 1 capsule (200 mg total) by mouth 2 (two) times daily.     polyethylene glycol packet  Commonly known as:  MIRALAX / GLYCOLAX  Take 17 g by mouth daily.     simvastatin 40 MG tablet  Commonly known as:  ZOCOR  Take 40 mg by mouth at bedtime.     tamsulosin 0.4 MG Caps capsule  Commonly known as:  FLOMAX  Take 0.4 mg by mouth at bedtime.     tuberculin 5 UNIT/0.1ML injection  Inject 5 Units into the skin See admin instructions. Every Sun and Friday for 3 days for first step PPD         Brief H and P: For complete details please refer to admission H and P, but in brief, [name] is a 79 y.o. male who is residing at the [facility] post surgery for a right Femoral Neck Fracture on 12/26/2012 who suffered 2 seizures, and was sent to the ED. The patient was postictal at the time of presentation and could not give history. His son was at the bedside and stated that when he  spoke to his father earlier in the day, he was not making sense, so he went to the facility and while he was there, his father was witnessed to have a grand- mal seizure that lasted about 15 minutes. EMS was called and he was taken to the ED, and he had another seizure that reported lasted 8 minutes. He was medicated with 1 mg of IV Ativan once.    Hospital Course:   Patient is a 79 year old Caucasian male with history of seizures who was on Dilantin at home, recent trip and fall at home causing right femoral neck fracture status post surgical repair on 12/26/2012 by Dr. [name], was admitted from nursing home for decreased mental status, his presentation was consistent with seizure along with possible small acute CVA. Head CT confirmed a small CVA and EEG confirmed him to be in status epilepticus. Neurology is following the patient. He is getting IV  Dilantin along with Keppra which was recommended by neurology   Status epilepticus in a patient with history of seizures-Threshold could have been lowered due to acute CVA however his Dilantin level was subtherapeutic, extra dose of Dilantin was given with pharmacy dosing. IV Keppra was started by neurology on 01/08/2013. Patient has a long history of seizures and he presented with breakthrough seizures. Patient was on Tegretol and Dilantin in both Tegretol and Dilantin levels were subtherapeutic at 2.7 and 2.8 respectively at the time of admission. On workup, CT head was obtained and showed small right basal ganglia infarct which was new from the last imaging on 12/26/12. Patient was already on aspirin and Plavix prior to the admission. Continuous EEG monitoring was done and showed rhythmic general polyspike and wave. On 01/09/13, continuous EEG revealed no further seizures and mental status seemed to be more appropriate.  Patient was seen by neurology today, recommended to Dilantin 200 mg BID, Tegretol 200 mg BID. Levels were checked today, phenytoin level is 14.6 and carbamazepine level is 3.4. Neurology, Dr. Amada Jupiter advised to make any further changes slowly and follow the Tegretol and Dilantin level on 01/12/2013.    Acute Right basal ganglia ischemic CVA causing expressive aphasia, possible left-sided weakness. As mentioned above CT head was obtained for the workup of seizures and showed small right basal ganglia infarct which was new from the last imaging on 12/26/12. Patient was closely followed by stroke service. MRI of the brain however showed no acute intracranial abnormality, New mild signal abnormality in the right lentiform nuclei compatible with small vessel disease, stable intracranial MRA. 2-D echo showed EF of 65-70%, grade 1 diastolic dysfunction, no regional wall motion abnormalities. Carotid Dopplers showed 1-39% stenosis involving right a.c. and left ICA. He was on dual antiplatelet  therapy of aspirin Plavix along with statin which will be continued. He will followup with Dr. Pearlean Brownie in 2 months.  Patient was seen by speech therapy and currently tolerating carb modified diet.   Dyslipidemia. Continue statins   Hypertension. Currently stable, continue antihypertensives    Diabetes mellitus type 2.: Hemoglobin A1c is 7.0, continue Lantus and sliding scale insulin please adjust Lantus dose according to the blood sugars.   Recent Right femoral neck fracture with surgical repair 12/26/2012 [name]) - PT resume, outpt Ortho follow post DC. He has an appointment with his orthopedic surgeon in the first week of December.  please follow on the appointment date     Day of Discharge BP 132/60  Pulse 73  Temp(Src) 98 F (36.7 C) (Oral)  Resp 18  Ht 5\' 10"  (1.778 m)  Wt 91.581 kg (201 lb 14.4 oz)  BMI 28.97 kg/m2  SpO2 97%  Physical Exam: General: Alert and awake oriented x3 not in any acute distress. CVS: S1-S2 clear no murmur rubs or gallops Chest: clear to auscultation bilaterally, no wheezing rales or rhonchi Abdomen: soft nontender, nondistended, normal bowel sounds Extremities: no cyanosis, clubbing or edema noted bilaterally    The results of significant diagnostics from this hospitalization (including imaging, microbiology, ancillary and laboratory) are listed below for reference.    LAB RESULTS: Basic Metabolic Panel:  Recent Labs Lab 01/08/13 0536 01/09/13 0745  NA 134* 134*  K 4.3 4.3  CL 100 99  CO2 21 18*  GLUCOSE 188* 147*  BUN 17 14  CREATININE 1.02 1.09  CALCIUM 8.3* 8.4   Liver Function Tests:  Recent Labs Lab 01/06/13 2137  AST 17  ALT 18  ALKPHOS 107  BILITOT 0.2*  PROT 6.7  ALBUMIN 2.8*   No results found for this basename: LIPASE, AMYLASE,  in the last 168 hours No results found for this basename: AMMONIA,  in the last 168 hours CBC:  Recent Labs Lab 01/06/13 2137 01/07/13 0930 01/08/13 0536  WBC 14.6* 11.3* 11.0*   NEUTROABS 11.6*  --   --   HGB 10.2* 10.3* 9.7*  HCT 30.6* 30.1* 29.3*  MCV 94.7 94.7 95.1  PLT 650* 609* 608*   Cardiac Enzymes: No results found for this basename: CKTOTAL, CKMB, CKMBINDEX, TROPONINI,  in the last 168 hours BNP: No components found with this basename: POCBNP,  CBG:  Recent Labs Lab 01/10/13 0804 01/10/13 1151  GLUCAP 146* 237*    Significant Diagnostic Studies:  Dg Chest 2 View  01/07/2013   CLINICAL DATA:  Stroke  EXAM: CHEST  2 VIEW  COMPARISON:  In prior radiograph from 01/06/2013  FINDINGS: Median sternotomy wires with underlying CABG markers are unchanged. Cardiac and mediastinal silhouettes are stable.  Lungs are mildly hypoinflated. Bibasilar linear and patchy opacities are improved as compared to the prior exam, and are most consistent with atelectasis. No pulmonary edema or pleural effusion. No pneumothorax. No focal infiltrates identified.  Osseous structures are unchanged.  IMPRESSION: Mild bibasilar atelectasis, improved as compared to prior radiograph. No acute cardiopulmonary process identified.   Electronically Signed   By: Rise Mu M.D.   On: 01/07/2013 01:45   Ct Head Wo Contrast  01/06/2013   CLINICAL DATA:  Seizures  EXAM: CT HEAD WITHOUT CONTRAST  TECHNIQUE: Contiguous axial images were obtained from the base of the skull through the vertex without intravenous contrast.  COMPARISON:  12/26/2012  FINDINGS: Skull and Sinuses:No significant abnormality.  Orbits: Bilateral cataract resection.  Brain: New small (1cm) area of low-attenuation in the anterior limb right internal capsule, also involving the caudate head. Remote lacunar infarct in the anterior left thalamus, near the genu of internal capsule. No hemorrhage or hydrocephalus, no evidence of mass lesion. Cerebral and cerebellar volume loss.  These results were called by telephone at the time of interpretation on 01/06/2013 at 10:58 PM to Dr. Francee Piccolo , who verbally  acknowledged these results.  IMPRESSION: 1. 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 2. No acute cortical abnormality to explain seizures.   Electronically Signed   By: Tiburcio Pea M.D.   On: 01/06/2013 22:59   Dg Chest Portable 1 View  01/06/2013   CLINICAL DATA:  Seizure with altered mental status  EXAM: PORTABLE CHEST - 1  VIEW  COMPARISON:  12/25/2012  FINDINGS: Status post CABG. Heart size upper normal. Vascular pattern normal. Retrocardiac area not well evaluated on single AP view. Lungs otherwise clear.  IMPRESSION: No acute findings   Electronically Signed   By: Esperanza Heir M.D.   On: 01/06/2013 22:10    2D ECHO: Study Conclusions  - Left ventricle: The cavity size was normal. There was mild concentric hypertrophy. Systolic function was vigorous. The estimated ejection fraction was in the range of 65% to 70%. Wall motion was normal; there were no regional wall motion abnormalities. Doppler parameters are consistent with abnormal left ventricular relaxation (grade 1 diastolic dysfunction). - Pulmonary arteries: Systolic pressure was mildly increased. PA peak pressure: 33mm Hg (S). Impressions:  - No cardiac source of emboli was indentified. Compared to the prior study, there has been no significant interval change.    Disposition and Follow-up: Discharge Orders   Future Orders Complete By Expires   Diet Carb Modified  As directed    Discharge instructions  As directed    Comments:     1)  seizure precautions   2) please check Tegretol and Dilantin level on Friday 01/12/13, notify facility M.D/ Dr [name] of the results.   Increase activity slowly  As directed        DISPOSITION: Skilled nursing facility   DIET: Carb modified diet  ACTIVITY:As tolerated with physical therapy   TESTS THAT NEED FOLLOW-UP Carbamazepine level and Dilantin level on Friday, 01/12/13   DISCHARGE FOLLOW-UP Follow-up Information   Follow up with  [name], MD. Schedule an appointment as soon as possible for a visit in 10 days. (for hoospital follow-up)    Specialty:  Family Medicine   Contact information:   [address] [place] [address] [phone]       Follow up with Gates Rigg, MD. Schedule an appointment as soon as possible for a visit in 2 months.   Specialties:  Neurology, Radiology   Contact information:   [address] [place] [address] [phone]       Follow up with Nilda Simmer, MD. Schedule an appointment as soon as possible for a visit in 2 weeks.   Specialty:  Orthopedic Surgery   Contact information:   [address] [place] [address] [phone]       Time spent on Discharge: 45 mins  Signed:   Katrisha Segall M.D. [facility] 01/10/2013, 12:14 PM Pager: [phone]

## 2013-01-15 ENCOUNTER — Other Ambulatory Visit: Payer: Self-pay | Admitting: *Deleted

## 2013-01-15 MED ORDER — HYDROCODONE-ACETAMINOPHEN 5-325 MG PO TABS: ORAL_TABLET | ORAL | Status: DC

## 2013-01-19 ENCOUNTER — Encounter: Payer: Self-pay | Admitting: Internal Medicine

## 2013-01-19 ENCOUNTER — Non-Acute Institutional Stay (SKILLED_NURSING_FACILITY): Payer: Medicare Other | Admitting: Internal Medicine

## 2013-01-19 DIAGNOSIS — R569 Unspecified convulsions: Secondary | ICD-10-CM

## 2013-01-19 DIAGNOSIS — I639 Cerebral infarction, unspecified: Secondary | ICD-10-CM

## 2013-01-19 DIAGNOSIS — I6350 Cerebral infarction due to unspecified occlusion or stenosis of unspecified cerebral artery: Secondary | ICD-10-CM

## 2013-01-19 DIAGNOSIS — IMO0002 Reserved for concepts with insufficient information to code with codable children: Secondary | ICD-10-CM

## 2013-01-19 DIAGNOSIS — I10 Essential (primary) hypertension: Secondary | ICD-10-CM

## 2013-01-19 DIAGNOSIS — E119 Type 2 diabetes mellitus without complications: Secondary | ICD-10-CM

## 2013-01-19 DIAGNOSIS — S72001P Fracture of unspecified part of neck of right femur, subsequent encounter for closed fracture with malunion: Secondary | ICD-10-CM

## 2013-01-19 NOTE — Progress Notes (Signed)
Patient ID: [name], male   DOB: 1928-11-26, 79 y.o.   MRN: [id number]  [name] living [name]    PCP: [name], MD  Code Status: full code  No Known Allergies  Chief Complaint: new admit  HPI:  79 y/o male patient is here for STR. He was admitted on 12/29/12 from the hospital after undergoing right hip hemiarthroplasty 12/26/12 for right femoral neck fracture. He was undergoing rehabilitation in the facility when he was again readmitted to the hospital from 01/06/13- 01/10/13 with acute CVA and status epilepticus where his seizure medication was adjusted after neurology consult. He was put on dual therapy of aspirin and plavix with statin and to follow up with neurology. He was sent back to the facility and has undergone rehabilitation here. He has made improvement, using a walker. No falls in the facility. No further seizure activities. He has been followed by orthopedics. His daughter is present in the room during the visit. No question/ concerns from patient or family. No new concern from staff  Review of Systems  Constitutional: Negative for fever, chills, weight loss, malaise/fatigue and diaphoresis.  HENT: Negative for congestion and hearing loss.   Eyes: Negative for blurred vision.  Respiratory: Negative for cough, sputum production, shortness of breath and wheezing.   Cardiovascular: Negative for chest pain, palpitations and leg swelling.  Gastrointestinal: Negative for heartburn, nausea, vomiting, abdominal pain and constipation.  Genitourinary: Negative for dysuria.  Musculoskeletal: Negative for back pain and falls.  Skin: Negative for rash.  Neurological: Negative for dizziness, sensory change, focal weakness and headaches.  Psychiatric/Behavioral: Negative for depression. The patient is not nervous/anxious.      Past Medical History  Diagnosis Date  . CAD (coronary artery disease)     s/p cabg 3/10...s/p MI age 69  . Diabetes mellitus type II   .  HTN (hypertension)   . HLD (hyperlipidemia)   . Anxiety disorder   . Tremor   . GERD (gastroesophageal reflux disease)   . Pulmonary vein stenosis   . Angina   . Chronic kidney disease   . Anxiety   . Myocardial infarction 1970's; 2010  . Aspiration pneumonia     History of aspiration pneumonia with vent-dependent respiratory/notes 06/12/2008 (12/26/2012)  . Orthopnoea     "just recently" (12/26/2012)  . History of stomach ulcers     "long time ago" (12/26/2012)  . Seizures     "I've had them all my life; last one was 04/2008" (12/26/2012)  . Chronic lower back pain   . Depression     "used to have problems w/depression" (12/26/2012)  . Fall at home 12/25/2012    "stumbled over cord while I was blowing leaves; broke my right hip" (12/26/2012)   Past Surgical History  Procedure Laterality Date  . Coronary artery bypass graft  04/2008    "CABG X3" (12/26/2012)  . Inguinal hernia repair Right   . Cataract extraction w/ intraocular lens  implant, bilateral Bilateral ~2004  . Hip arthroplasty Right 12/26/2012    Procedure: Right Hip Hemiarthroplasty;  Surgeon: Sheral Apley, MD;  Location: [facility] OR;  Service: Orthopedics;  Laterality: Right;   Social History:   reports that he has never smoked. He has quit using smokeless tobacco. His smokeless tobacco use included Chew. He reports that he drinks alcohol. He reports that he does not use illicit drugs.  Family History  Problem Relation Age of Onset  . Coronary artery disease    . Hypertension  Medications: Patient's Medications  New Prescriptions   No medications on file  Previous Medications   ASPIRIN 81 MG CHEWABLE TABLET    Chew 1 tablet (81 mg total) by mouth daily.   CARBAMAZEPINE (TEGRETOL) 200 MG TABLET    Take 1 tablet (200 mg total) by mouth every 12 (twelve) hours.   CARVEDILOL (COREG) 25 MG TABLET    Take 25 mg by mouth 2 (two) times daily with a meal.    CLOPIDOGREL (PLAVIX) 75 MG TABLET    Take 1 tablet (75  mg total) by mouth daily with breakfast.   EXENATIDE ER 2 MG PEN    Inject 2 mg into the skin See admin instructions. Take on mondays nights   HYDROCODONE-ACETAMINOPHEN (NORCO) 5-325 MG PER TABLET    Take two tablets by mouth every 4 hours as needed for pain.   INSULIN GLARGINE (LANTUS SOLOSTAR) 100 UNIT/ML SOPN    Inject 25 Units into the skin at bedtime.   MIRTAZAPINE (REMERON) 15 MG TABLET    Take 1 tablet (15 mg total) by mouth at bedtime.   PANTOPRAZOLE (PROTONIX) 40 MG TABLET    Take 40 mg by mouth daily.    PHENYTOIN (DILANTIN) 200 MG ER CAPSULE    Take 1 capsule (200 mg total) by mouth 2 (two) times daily.   POLYETHYLENE GLYCOL (MIRALAX / GLYCOLAX) PACKET    Take 17 g by mouth daily.   SIMVASTATIN (ZOCOR) 40 MG TABLET    Take 40 mg by mouth at bedtime.    TAMSULOSIN HCL (FLOMAX) 0.4 MG CAPS    Take 0.4 mg by mouth at bedtime.    TUBERCULIN 5 UNIT/0.1ML INJECTION    Inject 5 Units into the skin See admin instructions. Every Sun and Friday for 3 days for first step PPD  Modified Medications   No medications on file  Discontinued Medications   No medications on file     Physical Exam: Filed Vitals:   01/19/13 1000  BP: 120/68  Pulse: 77  Temp: 97.8 F (36.6 C)  Resp: 18  Height: 5\' 10"  (1.778 m)  Weight: 184 lb (83.462 kg)  SpO2: 96%   General- elderly male in no acute distress Head- atraumatic, normocephalic Eyes- PERRLA, EOMI, no pallor, no icterus Neck- no lymphadenopathy, no thyromegaly, no jugular vein distension, no carotid bruit Chest- no chest wall deformities, no chest wall tenderness Cardiovascular- normal s1,s2, no murmurs/ rubs/ gallops Respiratory- bilateral clear to auscultation, no wheeze, no rhonchi, no crackles Abdomen- bowel sounds present, soft, non tender Musculoskeletal- able to move all 4 extremities, no spinal and paraspinal tenderness, using a walker Neurological- no focal deficit Psychiatry- alert and oriented to person, place and time, normal  mood and affect    Labs reviewed: Basic Metabolic Panel:  Recent Labs  16/10/96 0930 01/08/13 0536 01/09/13 0745  NA 135 134* 134*  K 5.0 4.3 4.3  CL 103 100 99  CO2 24 21 18*  GLUCOSE 219* 188* 147*  BUN 20 17 14   CREATININE 1.06 1.02 1.09  CALCIUM 8.8 8.3* 8.4   Liver Function Tests:  Recent Labs  05/19/12 0500 12/25/12 1815 01/06/13 2137  AST 17 23 17   ALT 19 22 18   ALKPHOS 121* 123* 107  BILITOT 0.2* 0.2* 0.2*  PROT 6.7 7.3 6.7  ALBUMIN 3.1* 3.5 2.8*  CBC:  Recent Labs  05/19/12 0500 12/25/12 1815  01/06/13 2137 01/07/13 0930 01/08/13 0536  WBC 7.6 12.0*  < > 14.6* 11.3* 11.0*  NEUTROABS  3.7 8.5*  --  11.6*  --   --   HGB 11.6* 12.8*  < > 10.2* 10.3* 9.7*  HCT 33.5* 37.1*  < > 30.6* 30.1* 29.3*  MCV 89.1 91.8  < > 94.7 94.7 95.1  PLT 213 244  < > 650* 609* 608*  < > = values in this interval not displayed.  CBG:  Recent Labs  01/10/13 01/10/13 0804 01/10/13 1151  GLUCAP 118* 146* 237*   01/12/13  carbemezepine level 6.2  Dilantin level 17.2  Radiological Exams:  Significant Diagnostic Studies:   Dg Chest 2 View  01/07/2013   CLINICAL DATA:  Stroke  EXAM: CHEST  2 VIEW  COMPARISON:  In prior radiograph from 01/06/2013  FINDINGS: Median sternotomy wires with underlying CABG markers are unchanged. Cardiac and mediastinal silhouettes are stable.  Lungs are mildly hypoinflated. Bibasilar linear and patchy opacities are improved as compared to the prior exam, and are most consistent with atelectasis. No pulmonary edema or pleural effusion. No pneumothorax. No focal infiltrates identified.  Osseous structures are unchanged.  IMPRESSION: Mild bibasilar atelectasis, improved as compared to prior radiograph. No acute cardiopulmonary process identified.   Electronically Signed   By: Rise Mu M.D.   On: 01/07/2013 01:45   Ct Head Wo Contrast  01/06/2013   CLINICAL DATA:  Seizures  EXAM: CT HEAD WITHOUT CONTRAST  TECHNIQUE: Contiguous axial  images were obtained from the base of the skull through the vertex without intravenous contrast.  COMPARISON:  12/26/2012  FINDINGS: Skull and Sinuses:No significant abnormality.  Orbits: Bilateral cataract resection.  Brain: New small (1cm) area of low-attenuation in the anterior limb right internal capsule, also involving the caudate head. Remote lacunar infarct in the anterior left thalamus, near the genu of internal capsule. No hemorrhage or hydrocephalus, no evidence of mass lesion. Cerebral and cerebellar volume loss.  These results were called by telephone at the time of interpretation on 01/06/2013 at 10:58 PM to Dr. Francee Piccolo , who verbally acknowledged these results.  IMPRESSION: 1. 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 2. No acute cortical abnormality to explain seizures.   Electronically Signed   By: Tiburcio Pea M.D.   On: 01/06/2013 22:59   Dg Chest Portable 1 View  01/06/2013   CLINICAL DATA:  Seizure with altered mental status  EXAM: PORTABLE CHEST - 1 VIEW  COMPARISON:  12/25/2012  FINDINGS: Status post CABG. Heart size upper normal. Vascular pattern normal. Retrocardiac area not well evaluated on single AP view. Lungs otherwise clear.  IMPRESSION: No acute findings   Electronically Signed   By: Esperanza Heir M.D.   On: 01/06/2013 22:10    2D ECHO: Study Conclusions  - Left ventricle: The cavity size was normal. There was mild concentric hypertrophy. Systolic function was vigorous. The estimated ejection fraction was in the range of 65% to 70%. Wall motion was normal; there were no regional wall motion abnormalities. Doppler parameters are consistent with abnormal left ventricular relaxation (grade 1 diastolic dysfunction). - Pulmonary arteries: Systolic pressure was mildly increased. PA peak pressure: 33mm Hg (S). Impressions:  - No cardiac source of emboli was indentified. Compared to the prior study, there has been no  significant interval change.    Assessment/Plan  Seizure- has remained seizure free. The drug level is therapeutic. Continue phenytoin 200 mg bid and carbamazepine 200 mg bid. Seizure precautions. Has follow up with neurology  Acute CVA- no residual weakness. Continue aspirin and plavix with statin.  bp well controlled. Continue coreg  Dm type 2- continue exenatide with lantus 25 u, monitor cbg  Right femoral neck fracture- s/p arthroplasty, working well with rehab team for muscle and gait training and strengthening. Fall precautions. Continue prn pain medication. Continue aspirin.  GERD- controlled, continue current regimen of PPI  BPH- flomax 0.4 mg daily to be continued  Hypertension- continue coreg and monitor bp and renal function  Family/ staff Communication: reviewed care plan with patient and nursing supervisor   Goals of care: to return home after completion of STR   Labs/tests ordered- none

## 2013-01-26 ENCOUNTER — Non-Acute Institutional Stay (SKILLED_NURSING_FACILITY): Payer: Medicare Other | Admitting: Internal Medicine

## 2013-01-26 DIAGNOSIS — J069 Acute upper respiratory infection, unspecified: Secondary | ICD-10-CM

## 2013-01-26 NOTE — Progress Notes (Signed)
Patient ID: [name], male   DOB: 1928-04-12, 79 y.o.   MRN: [id number]     [name] [place]  No Known Allergies  Chief Complaint  Patient presents with  . Acute Visit    cough    HPI 79 y/o male patient is seen today for cough with yellow productive mucus. Denies any fever or chills. No runny nose. The patient is here for STR after hip and femoral neck fracture. He is also had a CVA while in the facility.   Review of Systems  Constitutional: Negative for fever, chills HENT: Negative for hearing loss.   Eyes: Negative for blurred vision.  Respiratory: Negative for shortness of breath and wheezing.   Cardiovascular: Negative for chest pain, palpitations and leg swelling.  Gastrointestinal: Negative for heartburn, nausea, vomiting, abdominal pain and constipation.  Genitourinary: Negative for dysuria.  Musculoskeletal: Negative for back pain and falls.  Skin: Negative for rash.  Neurological: Negative for dizziness and headaches.  Psychiatric/Behavioral: Negative for depression. The patient is not nervous/anxious.    Past Medical History  Diagnosis Date  . CAD (coronary artery disease)     s/p cabg 3/10...s/p MI age 13  . Diabetes mellitus type II   . HTN (hypertension)   . HLD (hyperlipidemia)   . Anxiety disorder   . Tremor   . GERD (gastroesophageal reflux disease)   . Pulmonary vein stenosis   . Angina   . Chronic kidney disease   . Anxiety   . Myocardial infarction 1970's; 2010  . Aspiration pneumonia     History of aspiration pneumonia with vent-dependent respiratory/notes 06/12/2008 (12/26/2012)  . Orthopnoea     "just recently" (12/26/2012)  . History of stomach ulcers     "long time ago" (12/26/2012)  . Seizures     "I've had them all my life; last one was 04/2008" (12/26/2012)  . Chronic lower back pain   . Depression     "used to have problems w/depression" (12/26/2012)  . Fall at home 12/25/2012    "stumbled over cord while I was blowing  leaves; broke my right hip" (12/26/2012)  . CVA (cerebral infarction) 01/07/2013    Acute right brain stroke  . Fracture of femoral neck, right 01/03/2013   Medication reviewed. See MAR  Physical exam Afebrile, other vital signs are stable  General- elderly male in no acute distress Head- atraumatic, normocephalic Eyes- PERRLA, EOMI, no pallor, no icterus Neck- no lymphadenopathy, no thyromegaly Chest- no chest wall deformities, no chest wall tenderness Cardiovascular- normal s1,s2, no murmurs/ rubs/ gallops Respiratory- bilateral clear to auscultation, no wheeze, no rhonchi, no crackles Abdomen- bowel sounds present, soft, non tender Musculoskeletal- able to move all 4 extremities, no spinal and paraspinal tenderness, using a walker Neurological- no focal deficit Psychiatry- alert and oriented to person, place and time, normal mood and affect  Assessment/plan  Acute URI Will treat him for acute URI with zpack x 5 days and florastor for 2 weeks Robitussin q8h x 5 days for his cough Reassess if no improvement

## 2013-02-13 ENCOUNTER — Non-Acute Institutional Stay (SKILLED_NURSING_FACILITY): Payer: Medicare Other | Admitting: Internal Medicine

## 2013-02-13 ENCOUNTER — Emergency Department (HOSPITAL_COMMUNITY): Payer: Medicare Other

## 2013-02-13 ENCOUNTER — Encounter (HOSPITAL_COMMUNITY): Payer: Self-pay | Admitting: Internal Medicine

## 2013-02-13 ENCOUNTER — Inpatient Hospital Stay (HOSPITAL_COMMUNITY)
Admission: EM | Admit: 2013-02-13 | Discharge: 2013-02-15 | DRG: 092 | Disposition: A | Discharge status: Home Health | Payer: Medicare Other | Attending: Internal Medicine | Admitting: Internal Medicine

## 2013-02-13 ENCOUNTER — Encounter: Payer: Self-pay | Admitting: Internal Medicine

## 2013-02-13 DIAGNOSIS — R269 Unspecified abnormalities of gait and mobility: Secondary | ICD-10-CM

## 2013-02-13 DIAGNOSIS — K219 Gastro-esophageal reflux disease without esophagitis: Secondary | ICD-10-CM

## 2013-02-13 DIAGNOSIS — Z79899 Other long term (current) drug therapy: Secondary | ICD-10-CM

## 2013-02-13 DIAGNOSIS — I639 Cerebral infarction, unspecified: Secondary | ICD-10-CM

## 2013-02-13 DIAGNOSIS — E119 Type 2 diabetes mellitus without complications: Secondary | ICD-10-CM

## 2013-02-13 DIAGNOSIS — N400 Enlarged prostate without lower urinary tract symptoms: Secondary | ICD-10-CM

## 2013-02-13 DIAGNOSIS — F329 Major depressive disorder, single episode, unspecified: Secondary | ICD-10-CM | POA: Diagnosis present

## 2013-02-13 DIAGNOSIS — S72001D Fracture of unspecified part of neck of right femur, subsequent encounter for closed fracture with routine healing: Secondary | ICD-10-CM

## 2013-02-13 DIAGNOSIS — Z8249 Family history of ischemic heart disease and other diseases of the circulatory system: Secondary | ICD-10-CM

## 2013-02-13 DIAGNOSIS — F411 Generalized anxiety disorder: Secondary | ICD-10-CM | POA: Diagnosis present

## 2013-02-13 DIAGNOSIS — Z96649 Presence of unspecified artificial hip joint: Secondary | ICD-10-CM

## 2013-02-13 DIAGNOSIS — Z7902 Long term (current) use of antithrombotics/antiplatelets: Secondary | ICD-10-CM

## 2013-02-13 DIAGNOSIS — T420X1A Poisoning by hydantoin derivatives, accidental (unintentional), initial encounter: Secondary | ICD-10-CM

## 2013-02-13 DIAGNOSIS — I10 Essential (primary) hypertension: Secondary | ICD-10-CM

## 2013-02-13 DIAGNOSIS — G40909 Epilepsy, unspecified, not intractable, without status epilepticus: Secondary | ICD-10-CM | POA: Diagnosis present

## 2013-02-13 DIAGNOSIS — R569 Unspecified convulsions: Secondary | ICD-10-CM | POA: Diagnosis present

## 2013-02-13 DIAGNOSIS — Z794 Long term (current) use of insulin: Secondary | ICD-10-CM

## 2013-02-13 DIAGNOSIS — I129 Hypertensive chronic kidney disease with stage 1 through stage 4 chronic kidney disease, or unspecified chronic kidney disease: Secondary | ICD-10-CM | POA: Diagnosis present

## 2013-02-13 DIAGNOSIS — E860 Dehydration: Secondary | ICD-10-CM | POA: Diagnosis present

## 2013-02-13 DIAGNOSIS — E785 Hyperlipidemia, unspecified: Secondary | ICD-10-CM | POA: Diagnosis present

## 2013-02-13 DIAGNOSIS — I25810 Atherosclerosis of coronary artery bypass graft(s) without angina pectoris: Secondary | ICD-10-CM

## 2013-02-13 DIAGNOSIS — Z8673 Personal history of transient ischemic attack (TIA), and cerebral infarction without residual deficits: Secondary | ICD-10-CM

## 2013-02-13 DIAGNOSIS — N189 Chronic kidney disease, unspecified: Secondary | ICD-10-CM | POA: Diagnosis present

## 2013-02-13 DIAGNOSIS — I6350 Cerebral infarction due to unspecified occlusion or stenosis of unspecified cerebral artery: Secondary | ICD-10-CM

## 2013-02-13 DIAGNOSIS — D649 Anemia, unspecified: Secondary | ICD-10-CM | POA: Diagnosis present

## 2013-02-13 DIAGNOSIS — R279 Unspecified lack of coordination: Secondary | ICD-10-CM | POA: Diagnosis present

## 2013-02-13 DIAGNOSIS — T420X5A Adverse effect of hydantoin derivatives, initial encounter: Secondary | ICD-10-CM | POA: Diagnosis present

## 2013-02-13 DIAGNOSIS — R471 Dysarthria and anarthria: Principal | ICD-10-CM | POA: Diagnosis present

## 2013-02-13 DIAGNOSIS — Z951 Presence of aortocoronary bypass graft: Secondary | ICD-10-CM

## 2013-02-13 DIAGNOSIS — I2510 Atherosclerotic heart disease of native coronary artery without angina pectoris: Secondary | ICD-10-CM | POA: Diagnosis present

## 2013-02-13 DIAGNOSIS — E875 Hyperkalemia: Secondary | ICD-10-CM | POA: Diagnosis present

## 2013-02-13 DIAGNOSIS — Z23 Encounter for immunization: Secondary | ICD-10-CM

## 2013-02-13 DIAGNOSIS — Y9210 Unspecified residential institution as the place of occurrence of the external cause: Secondary | ICD-10-CM | POA: Diagnosis present

## 2013-02-13 DIAGNOSIS — F3289 Other specified depressive episodes: Secondary | ICD-10-CM | POA: Diagnosis present

## 2013-02-13 DIAGNOSIS — E871 Hypo-osmolality and hyponatremia: Secondary | ICD-10-CM | POA: Diagnosis present

## 2013-02-13 DIAGNOSIS — Z7982 Long term (current) use of aspirin: Secondary | ICD-10-CM

## 2013-02-13 DIAGNOSIS — I252 Old myocardial infarction: Secondary | ICD-10-CM

## 2013-02-13 DIAGNOSIS — S72009D Fracture of unspecified part of neck of unspecified femur, subsequent encounter for closed fracture with routine healing: Secondary | ICD-10-CM

## 2013-02-13 HISTORY — DX: Fracture of unspecified part of neck of right femur, initial encounter for closed fracture: S72.001A

## 2013-02-13 HISTORY — DX: Cerebral infarction, unspecified: I63.9

## 2013-02-13 LAB — CBC
HCT: 33.2 % — ABNORMAL LOW (ref 39.0–52.0)
Hemoglobin: 10.9 g/dL — ABNORMAL LOW (ref 13.0–17.0)
MCH: 31.3 pg (ref 26.0–34.0)
MCV: 95.4 fL (ref 78.0–100.0)
Platelets: 312 10*3/uL (ref 150–400)
RBC: 3.48 MIL/uL — ABNORMAL LOW (ref 4.22–5.81)
RDW: 13.5 % (ref 11.5–15.5)
WBC: 10.5 10*3/uL (ref 4.0–10.5)

## 2013-02-13 LAB — COMPREHENSIVE METABOLIC PANEL
ALT: 17 U/L (ref 0–53)
Alkaline Phosphatase: 135 U/L — ABNORMAL HIGH (ref 39–117)
BUN: 30 mg/dL — ABNORMAL HIGH (ref 6–23)
CO2: 22 mEq/L (ref 19–32)
Calcium: 8.6 mg/dL (ref 8.4–10.5)
GFR calc Af Amer: 63 mL/min — ABNORMAL LOW (ref >90)
GFR calc non Af Amer: 54 mL/min — ABNORMAL LOW (ref >90)
Glucose, Bld: 239 mg/dL — ABNORMAL HIGH (ref 70–99)
Potassium: 5.5 mEq/L — ABNORMAL HIGH (ref 3.7–5.3)
Sodium: 134 mEq/L — ABNORMAL LOW (ref 137–147)
Total Bilirubin: 0.2 mg/dL — ABNORMAL LOW (ref 0.3–1.2)

## 2013-02-13 LAB — GLUCOSE, CAPILLARY
Glucose-Capillary: 232 mg/dL — ABNORMAL HIGH (ref 70–99)
Glucose-Capillary: 181 mg/dL — ABNORMAL HIGH (ref 70–99)

## 2013-02-13 LAB — POCT I-STAT, CHEM 8
BUN: 33 mg/dL — ABNORMAL HIGH (ref 6–23)
Chloride: 101 mEq/L (ref 96–112)
Creatinine, Ser: 1.3 mg/dL (ref 0.50–1.35)
Glucose, Bld: 247 mg/dL — ABNORMAL HIGH (ref 70–99)
Hemoglobin: 11.2 g/dL — ABNORMAL LOW (ref 13.0–17.0)
Sodium: 133 mEq/L — ABNORMAL LOW (ref 137–147)
TCO2: 23 mmol/L (ref 0–100)

## 2013-02-13 LAB — URINALYSIS, ROUTINE W REFLEX MICROSCOPIC
Glucose, UA: NEGATIVE mg/dL
Leukocytes, UA: NEGATIVE
Nitrite: NEGATIVE
Specific Gravity, Urine: 1.017 (ref 1.005–1.030)
pH: 5 (ref 5.0–8.0)

## 2013-02-13 LAB — CARBAMAZEPINE LEVEL, TOTAL: Carbamazepine Lvl: 5.7 ug/mL (ref 4.0–12.0)

## 2013-02-13 LAB — DIFFERENTIAL
Eosinophils Absolute: 0.4 10*3/uL (ref 0.0–0.7)
Eosinophils Relative: 4 % (ref 0–5)
Lymphocytes Relative: 24 % (ref 12–46)
Lymphs Abs: 2.6 10*3/uL (ref 0.7–4.0)
Monocytes Absolute: 1 10*3/uL (ref 0.1–1.0)
Monocytes Relative: 10 % (ref 3–12)
Neutrophils Relative %: 62 % (ref 43–77)

## 2013-02-13 LAB — PHENYTOIN LEVEL, TOTAL: Phenytoin Lvl: 19.7 ug/mL (ref 10.0–20.0)

## 2013-02-13 LAB — POCT I-STAT TROPONIN I: Troponin i, poc: 0 ng/mL (ref 0.00–0.08)

## 2013-02-13 LAB — PROTIME-INR: Prothrombin Time: 14.2 seconds (ref 11.6–15.2)

## 2013-02-13 LAB — RAPID URINE DRUG SCREEN, HOSP PERFORMED: Barbiturates: NOT DETECTED

## 2013-02-13 LAB — TROPONIN I: Troponin I: <0.3 ng/mL (ref <0.30)

## 2013-02-13 MED ORDER — CARVEDILOL 25 MG PO TABS
25.0000 mg | ORAL_TABLET | Freq: Two times a day (BID) | ORAL | Status: DC
  Administered 2013-02-13 – 2013-02-15 (×4): 25 mg via ORAL
  Filled 2013-02-13 (×6): qty 1

## 2013-02-13 MED ORDER — MIRTAZAPINE 15 MG PO TABS
15.0000 mg | ORAL_TABLET | Freq: Every day | ORAL | Status: DC
  Administered 2013-02-13 – 2013-02-14 (×2): 15 mg via ORAL
  Filled 2013-02-13 (×3): qty 1

## 2013-02-13 MED ORDER — INSULIN ASPART 100 UNIT/ML ~~LOC~~ SOLN: 0.0000 [IU] | Freq: Every day | SUBCUTANEOUS | Status: DC

## 2013-02-13 MED ORDER — GUAIFENESIN-DM 100-10 MG/5ML PO SYRP: 5.0000 mL | ORAL_SOLUTION | ORAL | Status: DC | PRN

## 2013-02-13 MED ORDER — SENNA 8.6 MG PO TABS
1.0000 | ORAL_TABLET | Freq: Every day | ORAL | Status: DC
  Administered 2013-02-13 – 2013-02-14 (×2): 8.6 mg via ORAL
  Filled 2013-02-13 (×3): qty 1

## 2013-02-13 MED ORDER — CARBAMAZEPINE 200 MG PO TABS
400.0000 mg | ORAL_TABLET | Freq: Every day | ORAL | Status: DC
  Filled 2013-02-13: qty 2

## 2013-02-13 MED ORDER — ACETAMINOPHEN 650 MG RE SUPP: 650.0000 mg | Freq: Four times a day (QID) | RECTAL | Status: DC | PRN

## 2013-02-13 MED ORDER — INSULIN ASPART 100 UNIT/ML ~~LOC~~ SOLN: 0.0000 [IU] | Freq: Three times a day (TID) | SUBCUTANEOUS | Status: DC

## 2013-02-13 MED ORDER — SODIUM CHLORIDE 0.9 % IV SOLN
INTRAVENOUS | Status: DC
  Administered 2013-02-13: 18:00:00 via INTRAVENOUS
  Administered 2013-02-14: 75 mL/h via INTRAVENOUS
  Administered 2013-02-15: 08:00:00 via INTRAVENOUS

## 2013-02-13 MED ORDER — PANTOPRAZOLE SODIUM 40 MG PO TBEC
40.0000 mg | DELAYED_RELEASE_TABLET | Freq: Every day | ORAL | Status: DC
  Administered 2013-02-13 – 2013-02-15 (×3): 40 mg via ORAL
  Filled 2013-02-13 (×3): qty 1

## 2013-02-13 MED ORDER — HYDROCODONE-ACETAMINOPHEN 5-325 MG PO TABS: 2.0000 | ORAL_TABLET | ORAL | Status: DC | PRN

## 2013-02-13 MED ORDER — ACETAMINOPHEN 325 MG PO TABS: 650.0000 mg | ORAL_TABLET | Freq: Four times a day (QID) | ORAL | Status: DC | PRN

## 2013-02-13 MED ORDER — CARBAMAZEPINE ER 400 MG PO TB12
400.0000 mg | ORAL_TABLET | Freq: Every day | ORAL | Status: DC
  Administered 2013-02-13 – 2013-02-14 (×2): 400 mg via ORAL
  Filled 2013-02-13 (×3): qty 1

## 2013-02-13 MED ORDER — HYDRALAZINE HCL 20 MG/ML IJ SOLN: 5.0000 mg | Freq: Four times a day (QID) | INTRAMUSCULAR | Status: DC | PRN

## 2013-02-13 MED ORDER — SIMVASTATIN 40 MG PO TABS
40.0000 mg | ORAL_TABLET | Freq: Every day | ORAL | Status: DC
  Administered 2013-02-13 – 2013-02-14 (×2): 40 mg via ORAL
  Filled 2013-02-13 (×3): qty 1

## 2013-02-13 MED ORDER — POLYETHYLENE GLYCOL 3350 17 G PO PACK
17.0000 g | PACK | Freq: Every day | ORAL | Status: DC
  Administered 2013-02-14: 17 g via ORAL
  Filled 2013-02-13 (×2): qty 1

## 2013-02-13 MED ORDER — ALUM & MAG HYDROXIDE-SIMETH 200-200-20 MG/5ML PO SUSP: 30.0000 mL | Freq: Four times a day (QID) | ORAL | Status: DC | PRN

## 2013-02-13 MED ORDER — INSULIN ASPART 100 UNIT/ML ~~LOC~~ SOLN
0.0000 [IU] | Freq: Three times a day (TID) | SUBCUTANEOUS | Status: DC
  Administered 2013-02-14: 1 [IU] via SUBCUTANEOUS
  Administered 2013-02-14: 3 [IU] via SUBCUTANEOUS

## 2013-02-13 MED ORDER — INSULIN GLARGINE 100 UNITS/ML SOLOSTAR PEN
25.0000 [IU] | PEN_INJECTOR | Freq: Every day | SUBCUTANEOUS | Status: DC
  Filled 2013-02-13: qty 3

## 2013-02-13 MED ORDER — ONDANSETRON HCL 4 MG/2ML IJ SOLN: 4.0000 mg | Freq: Four times a day (QID) | INTRAMUSCULAR | Status: DC | PRN

## 2013-02-13 MED ORDER — ONDANSETRON HCL 4 MG PO TABS: 4.0000 mg | ORAL_TABLET | Freq: Four times a day (QID) | ORAL | Status: DC | PRN

## 2013-02-13 MED ORDER — CLOPIDOGREL BISULFATE 75 MG PO TABS
75.0000 mg | ORAL_TABLET | Freq: Every day | ORAL | Status: DC
  Administered 2013-02-14 – 2013-02-15 (×2): 75 mg via ORAL
  Filled 2013-02-13 (×3): qty 1

## 2013-02-13 MED ORDER — SERTRALINE HCL 100 MG PO TABS
100.0000 mg | ORAL_TABLET | Freq: Every day | ORAL | Status: DC
  Administered 2013-02-14 – 2013-02-15 (×2): 100 mg via ORAL
  Filled 2013-02-13 (×2): qty 1

## 2013-02-13 MED ORDER — EXENATIDE ER 2 MG ~~LOC~~ PEN: 2.0000 mg | PEN_INJECTOR | SUBCUTANEOUS | Status: DC

## 2013-02-13 MED ORDER — INSULIN GLARGINE 100 UNIT/ML ~~LOC~~ SOLN
25.0000 [IU] | Freq: Every day | SUBCUTANEOUS | Status: DC
  Administered 2013-02-13 – 2013-02-14 (×2): 25 [IU] via SUBCUTANEOUS
  Filled 2013-02-13 (×3): qty 0.25

## 2013-02-13 MED ORDER — ALBUTEROL SULFATE (2.5 MG/3ML) 0.083% IN NEBU: 2.5000 mg | INHALATION_SOLUTION | RESPIRATORY_TRACT | Status: DC | PRN

## 2013-02-13 MED ORDER — SODIUM CHLORIDE 0.9 % IV SOLN: 250.0000 mL | Freq: Once | INTRAVENOUS | Status: DC

## 2013-02-13 MED ORDER — TAMSULOSIN HCL 0.4 MG PO CAPS
0.4000 mg | ORAL_CAPSULE | Freq: Every day | ORAL | Status: DC
  Administered 2013-02-13 – 2013-02-14 (×2): 0.4 mg via ORAL
  Filled 2013-02-13 (×3): qty 1

## 2013-02-13 MED ORDER — ALTEPLASE (STROKE) FULL DOSE INFUSION
0.9000 mg/kg | Freq: Once | INTRAVENOUS | Status: DC
  Filled 2013-02-13: qty 65

## 2013-02-13 MED ORDER — ENOXAPARIN SODIUM 40 MG/0.4ML ~~LOC~~ SOLN
40.0000 mg | SUBCUTANEOUS | Status: DC
  Administered 2013-02-13 – 2013-02-14 (×2): 40 mg via SUBCUTANEOUS
  Filled 2013-02-13 (×3): qty 0.4

## 2013-02-13 NOTE — ED Notes (Addendum)
cbg 232 

## 2013-02-13 NOTE — Progress Notes (Signed)
Patient ID: [name], male   DOB: 08-26-1928, 79 y.o.   MRN: [id number]  Location:  [facility] L. Renato Gails, D.O., C.M.D.  PCP: [name], MD  No Known Allergies  Chief Complaint  Patient presents with  . Acute Visit    slurred speech, diaphoresis sudden onset at 1140am    HPI:  79 y/o male patient was here for STR. He was admitted on 12/29/12 from the hospital after undergoing right hip hemiarthroplasty 12/26/12 for right femoral neck fracture. He was undergoing rehabilitation in the facility when he was again readmitted to the hospital from 01/06/13- 01/10/13 with acute CVA and status epilepticus where his seizure medication was adjusted after neurology consult. He was put on dual therapy of aspirin and plavix with statin and to follow up with neurology. He was sent back to the facility and has undergone rehabilitation here. He has made improvement, using a walker. No falls in the facility. No further seizure activities  He was just seen by my colleague due to cold symptoms.    I was originally planning to see him for discharge home today, but he was noted to have acute onset of dysarthria this morning at 1140am.  He is able to ambulate.  He was extremely diaphoretic.  Staff feel he has a facial droop.  His CBG was 260.  His vitals were stable.  He was feeling fine and still wanting to go home, but had notably slurred speech when normally his speech is completely understandable.  I completed paperwork for EMS for transfer.  Review of Systems:  Review of Systems  Constitutional: Positive for diaphoresis. Negative for fever, chills and malaise/fatigue.  HENT: Positive for congestion.   Eyes: Negative for blurred vision.  Respiratory: Positive for cough. Negative for shortness of breath.   Cardiovascular: Negative for chest pain and leg swelling.  Gastrointestinal: Negative for constipation.  Genitourinary: Negative for dysuria.  Musculoskeletal:  Negative for falls and myalgias.  Skin: Negative for rash.  Neurological: Positive for speech change and seizures. Negative for dizziness, tingling, tremors, sensory change, focal weakness, loss of consciousness and weakness.     Past Medical History  Diagnosis Date  . CAD (coronary artery disease)     s/p cabg 3/10...s/p MI age 14  . Diabetes mellitus type II   . HTN (hypertension)   . HLD (hyperlipidemia)   . Anxiety disorder   . Tremor   . GERD (gastroesophageal reflux disease)   . Pulmonary vein stenosis   . Angina   . Chronic kidney disease   . Anxiety   . Myocardial infarction 1970's; 2010  . Aspiration pneumonia     History of aspiration pneumonia with vent-dependent respiratory/notes 06/12/2008 (12/26/2012)  . Orthopnoea     "just recently" (12/26/2012)  . History of stomach ulcers     "long time ago" (12/26/2012)  . Seizures     "I've had them all my life; last one was 04/2008" (12/26/2012)  . Chronic lower back pain   . Depression     "used to have problems w/depression" (12/26/2012)  . Fall at home 12/25/2012    "stumbled over cord while I was blowing leaves; broke my right hip" (12/26/2012)    Past Surgical History  Procedure Laterality Date  . Coronary artery bypass graft  04/2008    "CABG X3" (12/26/2012)  . Inguinal hernia repair Right   . Cataract extraction w/ intraocular lens  implant, bilateral Bilateral ~2004  . Hip arthroplasty Right 12/26/2012  Procedure: Right Hip Hemiarthroplasty;  Surgeon: Sheral Apley, MD;  Location: [facility] OR;  Service: Orthopedics;  Laterality: Right;    Social History:   reports that he has never smoked. He has quit using smokeless tobacco. His smokeless tobacco use included Chew. He reports that he drinks alcohol. He reports that he does not use illicit drugs.  Family History  Problem Relation Age of Onset  . Coronary artery disease    . Hypertension      Medications: Patient's Medications  New Prescriptions   No  medications on file  Previous Medications   ASPIRIN 81 MG CHEWABLE TABLET    Chew 1 tablet (81 mg total) by mouth daily.   CARBAMAZEPINE (TEGRETOL) 200 MG TABLET    Take 1 tablet (200 mg total) by mouth every 12 (twelve) hours.   CARVEDILOL (COREG) 25 MG TABLET    Take 25 mg by mouth 2 (two) times daily with a meal.    CLOPIDOGREL (PLAVIX) 75 MG TABLET    Take 1 tablet (75 mg total) by mouth daily with breakfast.   EXENATIDE ER 2 MG PEN    Inject 2 mg into the skin See admin instructions. Take on mondays nights   HYDROCODONE-ACETAMINOPHEN (NORCO) 5-325 MG PER TABLET    Take two tablets by mouth every 4 hours as needed for pain.   INSULIN GLARGINE (LANTUS SOLOSTAR) 100 UNIT/ML SOPN    Inject 25 Units into the skin at bedtime.   MIRTAZAPINE (REMERON) 15 MG TABLET    Take 1 tablet (15 mg total) by mouth at bedtime.   PANTOPRAZOLE (PROTONIX) 40 MG TABLET    Take 40 mg by mouth daily.    PHENYTOIN (DILANTIN) 200 MG ER CAPSULE    Take 1 capsule (200 mg total) by mouth 2 (two) times daily.   POLYETHYLENE GLYCOL (MIRALAX / GLYCOLAX) PACKET    Take 17 g by mouth daily.   SIMVASTATIN (ZOCOR) 40 MG TABLET    Take 40 mg by mouth at bedtime.    TAMSULOSIN HCL (FLOMAX) 0.4 MG CAPS    Take 0.4 mg by mouth at bedtime.    TUBERCULIN 5 UNIT/0.1ML INJECTION    Inject 5 Units into the skin See admin instructions. Every Sun and Friday for 3 days for first step PPD  Modified Medications   No medications on file  Discontinued Medications   No medications on file    Physical Exam:  Physical Exam  Nursing note and vitals reviewed. Constitutional: He is oriented to person, place, and time. He appears well-developed and well-nourished. No distress.  diaphoretic  Cardiovascular: Intact distal pulses.   irreg irreg  Pulmonary/Chest: Effort normal and breath sounds normal. No respiratory distress.  Abdominal: Soft. Bowel sounds are normal.  Neurological: He is alert and oriented to person, place, and time. A  cranial nerve deficit is present. He exhibits normal muscle tone. Coordination normal.  Facial droop, tongue deviating right, dysarthric speech (new onset)  Skin: Skin is warm.  diaphoretic  Psychiatric: He has a normal mood and affect.    Labs reviewed: Basic Metabolic Panel:  Recent Labs  09/81/19 0930 01/08/13 0536 01/09/13 0745  NA 135 134* 134*  K 5.0 4.3 4.3  CL 103 100 99  CO2 24 21 18*  GLUCOSE 219* 188* 147*  BUN 20 17 14   CREATININE 1.06 1.02 1.09  CALCIUM 8.8 8.3* 8.4   Liver Function Tests:  Recent Labs  05/19/12 0500 12/25/12 1815 01/06/13 2137  AST 17 23  17  ALT 19 22 18   ALKPHOS 121* 123* 107  BILITOT 0.2* 0.2* 0.2*  PROT 6.7 7.3 6.7  ALBUMIN 3.1* 3.5 2.8*  CBC:  Recent Labs  05/19/12 0500 12/25/12 1815  01/06/13 2137 01/07/13 0930 01/08/13 0536  WBC 7.6 12.0*  < > 14.6* 11.3* 11.0*  NEUTROABS 3.7 8.5*  --  11.6*  --   --   HGB 11.6* 12.8*  < > 10.2* 10.3* 9.7*  HCT 33.5* 37.1*  < > 30.6* 30.1* 29.3*  MCV 89.1 91.8  < > 94.7 94.7 95.1  PLT 213 244  < > 650* 609* 608*  < > = values in this interval not displayed. CBG:  Recent Labs  01/10/13 01/10/13 0804 01/10/13 1151  GLUCAP 118* 146* 237*   Reviewed in h/p  Assessment/Plan:   1. CVA (cerebral infarction) -was here for rehab after latest stroke--did not have any residual symptoms, but now having acute dysarthria for 20 mins when I was asked to see him so sent out for stroke workup -in afib this am when I listened to him -on plavix, baby asa, statin; bp control -pt's daughter to meet him at [facility] ED  2. Fracture of femoral neck, right, closed, with routine healing, subsequent encounter -was here for therapy after fracture, doing well walking with therapy prior to this   3. Diabetes mellitus type 2 in nonobese -glucose this am was elevated at 260, but not hypoglycemic or elevated enough to explain symptoms -cont exenatide and lantus therapy  4. BPH (benign prostatic  hyperplasia) -stable at present, cont flomax  5. Coronary atherosclerosis of autologous vein bypass graft -cont current meds with coreg, baby asa, plavix and statin therapy  6. Essential hypertension, benign -bp slightly elevated in 150s this am during acute event  7. Other convulsions -seizure meds were moved to evening only per daughter's request (unsure when this change was made)--continues with tegretol and dilantin, would check levels  8. GERD (gastroesophageal reflux disease) -asymptomatic at present while receiving protonix

## 2013-02-13 NOTE — Code Documentation (Signed)
79yo male arriving to [facility] at 1227 via [facility].  Patient from [facility] where he was receiving therapy s/p right hip surgery on 12/26/2012.  Per nursing home staff patient had been alert and oriented, ambulating in the hallway with rolling walker and physical therapist this morning.  It was planned for him to be discharged home this week.  Patient was working with occupational therapist when he had sudden onset slurred speech and became clammy at 1130.  Nurse was called to therapy room at 1140 and CBG was 260 and BP was 98/53 per report.  Patient transferred back to his room where he had a questionable facial droop, slurred speech and required 2 person assist to be transferred to bed. Nursing home MD assessed the patient and EMS was activated.  EMS activated Code Stroke.  Patient to CT on arrival.  NIHSS 5 on arrival, see documentation for details.  Exam positive for slurred speech, mild facial assymmetry, and ataxia.  2 PIVs started by RN.  Dr. Amada Jupiter at bedside and updated.  Patient taken to MRI per Dr. Amada Jupiter arriving at 1310.  MRI reviewed by MD.  No acute stroke treatment at this time.  Bedside handoff completed with ED RN Lillia Abed.

## 2013-02-13 NOTE — H&P (Signed)
[facility] History and Physical  [name] ZOX:[id number] DOB: Dec 22, 1928 DOA: 02/13/2013  Referring physician: ED physician, Dr. Gwendolyn Grant PCP: [name], MD   Chief Complaint: Slurred speech and ataxia  HPI: [name] is a 79 y.o. male with a history of a recent right brain stroke in November 2014, seizure disorder, and status post right hip hemiarthroplasty in November 2014, who presents from a local rehabilitation center, [facility] with her report of slurred speech and ataxia at the nursing facility. The patient does not recall any of his symptoms at the nursing facility. However, per history gathered, the patient had been walking in feeding himself and taking part in all of his activities, but at approximately 8:30 AM this morning, the occupational therapist noticed that the patient's speech became dysarthric. He was also noted to have nystagmus and ataxia. He was evaluated by the skilled nursing facility physician. He was transferred to the [facility] ED as a code stroke patient. The evaluation in the emergency department yielded no new stroke on the MRI/MRA of the brain. Neurology evaluated the patient. His Dilantin level was calculated based on his albumin and it was found to be in the toxic range. His carbamazepine level was apparently within normal limits. His urine drug screen was negative. His alcohol level was negative. His urinalysis was negative. His troponin I was within normal limits. His initial lab data revealed a sodium of 134, potassium of 5.5, and glucose of 239. Labs were repeated and they revealed a sodium of 133 and a potassium of 5.2, hemoglobin of 11.2, and glucose of 247. He is being admitted for further evaluation and management.  Of note, the patient currently has no complaints of headache, dizziness, double vision, nausea, vomiting, chest pain, or shortness of breath.   Review of Systems:  Constitutional:  He has had some weight loss, but  no night sweats, Fevers, chills, fatigue.  HEENT:  No headaches, Difficulty swallowing,Tooth/dental problems,Sore throat,  No sneezing, itching, ear ache, nasal congestion, post nasal drip,  Cardio-vascular:  No chest pain, Orthopnea, PND, swelling in lower extremities, anasarca, dizziness, palpitations  GI:  No heartburn, indigestion, abdominal pain, nausea, vomiting, diarrhea, change in bowel habits, loss of appetite  Resp:  No shortness of breath with exertion or at rest. No excess mucus, no productive cough, No non-productive cough, No coughing up of blood.No change in color of mucus.No wheezing.No chest wall deformity  Skin:  no rash or lesions.  GU:  no dysuria, change in color of urine, no urgency or frequency. No flank pain.  Musculoskeletal:  He has mild right hip pain with ambulation. No back pain.  Psych:  No change in mood or affect. No depression or anxiety. No memory loss.   Past Medical History  Diagnosis Date  . CAD (coronary artery disease)     s/p cabg 3/10...s/p MI age 21  . Diabetes mellitus type II   . HTN (hypertension)   . HLD (hyperlipidemia)   . Anxiety disorder   . Tremor   . GERD (gastroesophageal reflux disease)   . Pulmonary vein stenosis   . Angina   . Chronic kidney disease   . Anxiety   . Myocardial infarction 1970's; 2010  . Aspiration pneumonia     History of aspiration pneumonia with vent-dependent respiratory/notes 06/12/2008 (12/26/2012)  . Orthopnoea     "just recently" (12/26/2012)  . History of stomach ulcers     "long time ago" (12/26/2012)  . Seizures     "  I've had them all my life; last one was 04/2008" (12/26/2012)  . Chronic lower back pain   . Depression     "used to have problems w/depression" (12/26/2012)  . Fall at home 12/25/2012    "stumbled over cord while I was blowing leaves; broke my right hip" (12/26/2012)  . CVA (cerebral infarction) 01/07/2013    Acute right brain stroke  . Fracture of femoral neck, right  01/03/2013   Past Surgical History  Procedure Laterality Date  . Coronary artery bypass graft  04/2008    "CABG X3" (12/26/2012)  . Inguinal hernia repair Right   . Cataract extraction w/ intraocular lens  implant, bilateral Bilateral ~2004  . Hip arthroplasty Right 12/26/2012    Procedure: Right Hip Hemiarthroplasty;  Surgeon: Sheral Apley, MD;  Location: [facility] OR;  Service: Orthopedics;  Laterality: Right;   Social History: He is widowed. He has 4 children all. He lives in [place]. He was recently a [facility] of [name] for rehabilitation from recent hip surgery. He denies smoking tobacco use and stop chewing tobacco. He denies illicit drug use. He denies alcohol use.   No Known Allergies  Family History  Problem Relation Age of Onset  . Coronary artery disease    . Hypertension       Prior to Admission medications   Medication Sig Start Date End Date Taking? Authorizing Provider  carbamazepine (TEGRETOL) 200 MG tablet Take 400 mg by mouth at bedtime.   Yes Historical Provider, MD  carvedilol (COREG) 25 MG tablet Take 25 mg by mouth 2 (two) times daily with a meal.    Yes Historical Provider, MD  clopidogrel (PLAVIX) 75 MG tablet Take 1 tablet (75 mg total) by mouth daily with breakfast. 05/20/12  Yes Belkys A Regalado, MD  Exenatide ER 2 MG PEN Inject 2 mg into the skin once a week. On Monday   Yes Historical Provider, MD  HYDROcodone-acetaminophen (NORCO/VICODIN) 5-325 MG per tablet Take 2 tablets by mouth every 4 (four) hours as needed for moderate pain.   Yes Historical Provider, MD  insulin glargine (LANTUS) 100 units/mL SOLN Inject 25 Units into the skin at bedtime.   Yes Historical Provider, MD  mirtazapine (REMERON) 15 MG tablet Take 1 tablet (15 mg total) by mouth at bedtime. 01/10/13  Yes Ripudeep Jenna Luo, MD  pantoprazole (PROTONIX) 40 MG tablet Take 40 mg by mouth daily.    Yes Historical Provider, MD  phenytoin (DILANTIN) 100 MG ER capsule  Take 200 mg by mouth 2 (two) times daily.   Yes Historical Provider, MD  polyethylene glycol (MIRALAX / GLYCOLAX) packet Take 17 g by mouth daily. 12/29/12  Yes Belkys A Regalado, MD  sertraline (ZOLOFT) 50 MG tablet Take 100 mg by mouth daily.   Yes Historical Provider, MD  simvastatin (ZOCOR) 40 MG tablet Take 40 mg by mouth at bedtime.    Yes Historical Provider, MD  Tamsulosin HCl (FLOMAX) 0.4 MG CAPS Take 0.4 mg by mouth at bedtime.    Yes Historical Provider, MD  tuberculin 5 UNIT/0.1ML injection Inject 5 Units into the skin See admin instructions. Every Sun and Friday for 3 days for first step PPD    Historical Provider, MD   Physical Exam: Filed Vitals:   02/13/13 1716  BP: 175/58  Pulse: 67  Temp: 97.1 F (36.2 C)  Resp: 20    BP 175/58  Pulse 67  Temp(Src) 97.1 F (36.2 C)  Resp 20  Wt  72.576 kg (160 lb)  SpO2 100%  General:  Appears calm and comfortable Eyes: PERRL, normal lids, irises & conjunctiva; he as mild rotary nystagmus. ENT: grossly normal hearing, lips & tongue Neck: no LAD, masses or thyromegaly Cardiovascular: RRR, no m/r/g. No LE edema. Telemetry: SR, no arrhythmias  Respiratory: CTA bilaterally, no w/r/r. Normal respiratory effort. Abdomen: soft, ntnd Skin: no rash or induration seen on limited exam Musculoskeletal: grossly normal tone BUE/BLE; mild tenderness over the right hip girdle; well-healed postsurgical scar. Psychiatric: grossly normal mood and affect, speech fluent and appropriate Neurologic: He is alert and oriented x3. No dysarthria noted. Cranial nerves II through XII are all grossly intact except he does have mild rotary nystagmus with lateral gaze bilaterally. Strength is symmetric and 5 over 5 bilaterally.           Labs on Admission:  Basic Metabolic Panel:  Recent Labs Lab 02/13/13 1235 02/13/13 1255  NA 134* 133*  K 5.5* 5.2  CL 97 101  CO2 22  --   GLUCOSE 239* 247*  BUN 30* 33*  CREATININE 1.21 1.30  CALCIUM 8.6  --     Liver Function Tests:  Recent Labs Lab 02/13/13 1235  AST 17  ALT 17  ALKPHOS 135*  BILITOT 0.2*  PROT 7.1  ALBUMIN 3.1*   No results found for this basename: LIPASE, AMYLASE,  in the last 168 hours No results found for this basename: AMMONIA,  in the last 168 hours CBC:  Recent Labs Lab 02/13/13 1235 02/13/13 1255  WBC 10.5  --   NEUTROABS 6.5  --   HGB 10.9* 11.2*  HCT 33.2* 33.0*  MCV 95.4  --   PLT 312  --    Cardiac Enzymes:  Recent Labs Lab 02/13/13 1235  TROPONINI <0.30    BNP (last 3 results) No results found for this basename: PROBNP,  in the last 8760 hours CBG:  Recent Labs Lab 02/13/13 1234  GLUCAP 232*    Radiological Exams on Admission: Ct Head Wo Contrast  02/13/2013   CLINICAL DATA:  Sudden onset slurred speech. Code stroke. Question history of seizure  EXAM: CT HEAD WITHOUT CONTRAST  TECHNIQUE: Contiguous axial images were obtained from the base of the skull through the vertex without intravenous contrast.  COMPARISON:  01/06/2013  FINDINGS: Skull and Sinuses:No significant abnormality.  Orbits: Bilateral cataract resection.  Brain: No evidence of acute abnormality, such as acute infarction, hemorrhage, hydrocephalus, or mass lesion/mass effect. Unchanged appearance of remote small vessel injury in the right putamen and neighboring internal capsule. Cerebral volume loss which is age appropriate.  These results were called by telephone at the time of interpretation on 02/13/2013 at 12:51 PM to Dr. Amada Jupiter, who verbally acknowledged these results.  IMPRESSION: No evidence of acute hemorrhage or large territory ischemia.   Electronically Signed   By: Tiburcio Pea M.D.   On: 02/13/2013 12:55   Mr Shirlee Latch Wo Contrast  02/13/2013   CLINICAL DATA:  79 year old male with slurred speech and ataxia. Initial encounter. Stroke versus Dilantin toxicity. Code stroke.  EXAM: MRI HEAD WITHOUT CONTRAST  MRA HEAD WITHOUT CONTRAST  TECHNIQUE: Multiplanar,  multiecho pulse sequences of the brain and surrounding structures were obtained without intravenous contrast. Angiographic images of the head were obtained using MRA technique without contrast.  COMPARISON:  Head CT without contrast 1236 hr the same day. Brain MRI and MRA 01/10/2013 and earlier.  FINDINGS: MRI HEAD FINDINGS  There is a small crescentic area of  increased trace diffusion near the right caudate and anterior limb of the external capsule. This corresponds to the mild T2 and FLAIR hyperintensity seen in November. No restricted diffusion, and there is Stable decreased T1 signal here. No restricted diffusion or evidence of acute infarction.  Major intracranial vascular flow voids are stable. No ventriculomegaly. No midline shift, mass effect, or evidence of intracranial mass lesion. No acute intracranial hemorrhage identified. Intermittent mild motion artifact on today's study. Stable gray and white matter signal throughout the brain. Negative pituitary, cervicomedullary junction, visualized cervical spine common bone marrow signal.  Stable orbits soft tissues. Stable paranasal sinuses and mastoids. Visualized scalp soft tissues are within normal limits.  MRA HEAD FINDINGS  Today's images are intermittently degraded by motion.  Stable distal vertebral arteries. Patent PICA origins. Stable vertebrobasilar junction. Patent basilar artery with atherosclerotic irregularity and up to mild stenosis. Stable SCA origins. Fetal type bilateral PCA origins again noted. "Duplicated" left PCA again evident. Distal PCA branches today are somewhat obscured by motion.  Stable antegrade flow in both ICA siphons, detail mildly obscured by motion artifact today. Ophthalmic and posterior communicating artery origins appear stable. Carotid termini, MCA and ACA origins are stable and within normal limits. Visualized bilateral ACA and MCA branches are stable.  IMPRESSION: 1.  No acute intracranial abnormality. 2. Late subacute to  early chronic lacunar infarct near the right caudate and external capsule. 3. Stable intracranial MRA with atherosclerosis, but no proximal stenosis or major circle of Willis branch occlusion. Study reviewed in person with Dr. Amada Jupiter on 02/13/2013 at 1350 hrs.   Electronically Signed   By: Augusto Gamble M.D.   On: 02/13/2013 14:43   Mr Brain Wo Contrast  02/13/2013   CLINICAL DATA:  79 year old male with slurred speech and ataxia. Initial encounter. Stroke versus Dilantin toxicity. Code stroke.  EXAM: MRI HEAD WITHOUT CONTRAST  MRA HEAD WITHOUT CONTRAST  TECHNIQUE: Multiplanar, multiecho pulse sequences of the brain and surrounding structures were obtained without intravenous contrast. Angiographic images of the head were obtained using MRA technique without contrast.  COMPARISON:  Head CT without contrast 1236 hr the same day. Brain MRI and MRA 01/10/2013 and earlier.  FINDINGS: MRI HEAD FINDINGS  There is a small crescentic area of increased trace diffusion near the right caudate and anterior limb of the external capsule. This corresponds to the mild T2 and FLAIR hyperintensity seen in November. No restricted diffusion, and there is Stable decreased T1 signal here. No restricted diffusion or evidence of acute infarction.  Major intracranial vascular flow voids are stable. No ventriculomegaly. No midline shift, mass effect, or evidence of intracranial mass lesion. No acute intracranial hemorrhage identified. Intermittent mild motion artifact on today's study. Stable gray and white matter signal throughout the brain. Negative pituitary, cervicomedullary junction, visualized cervical spine common bone marrow signal.  Stable orbits soft tissues. Stable paranasal sinuses and mastoids. Visualized scalp soft tissues are within normal limits.  MRA HEAD FINDINGS  Today's images are intermittently degraded by motion.  Stable distal vertebral arteries. Patent PICA origins. Stable vertebrobasilar junction. Patent basilar  artery with atherosclerotic irregularity and up to mild stenosis. Stable SCA origins. Fetal type bilateral PCA origins again noted. "Duplicated" left PCA again evident. Distal PCA branches today are somewhat obscured by motion.  Stable antegrade flow in both ICA siphons, detail mildly obscured by motion artifact today. Ophthalmic and posterior communicating artery origins appear stable. Carotid termini, MCA and ACA origins are stable and within normal limits. Visualized bilateral ACA and  MCA branches are stable.  IMPRESSION: 1.  No acute intracranial abnormality. 2. Late subacute to early chronic lacunar infarct near the right caudate and external capsule. 3. Stable intracranial MRA with atherosclerosis, but no proximal stenosis or major circle of Willis branch occlusion. Study reviewed in person with Dr. Amada Jupiter on 02/13/2013 at 1350 hrs.   Electronically Signed   By: Augusto Gamble M.D.   On: 02/13/2013 14:43    EKG: Independently reviewed. Normal sinus rhythm with a heart rate of 62 beats per minute, old Q waves; nonspecific ST changes.  Assessment/Plan Principal Problem:   Dilantin toxicity Active Problems:   ATAXIA   Dysarthria   SEIZURE DISORDER   HTN (hypertension)   CORONARY ATHEROSCLEROSIS NATIVE CORONARY ARTERY   Anemia   Hyponatremia   Hyperkalemia   Diabetes mellitus type 2 in nonobese   Dehydration   History of stroke   1. Ataxia and dysarthria, likely secondary to Dilantin toxicity. He has no lower ataxic or dysarthric, but he does have mild nystagmus on exam. Otherwise, his exam is nonfocal currently. 2. Dilantin toxicity. Neurology had evaluated the patient in the ED for a code stroke, but his head MRI revealed no new acute strokes. The neurology team calculated his corrected Dilantin level as 27.4, clearly in the toxic range. We'll hold his oral Dilantin until further recommendations by neurology. 3. Long-standing seizure disorder. As above, we will hold oral Dilantin but will  continue Tegretol. We'll ask neurology for further recommendations regarding restarting Dilantin and/or modification in dosing. 4. Hyperkalemia. The patient has had a history of hyperkalemia. With IV fluids, his followup serum potassium is high normal at 5.2. 5. Hyponatremia, possibly secondary to dehydration. The patient's BUN/creatinine ratio is indicative of dehydration/volume depletion. Will continue IV hydration started in the ED. 6. Recent history of acute right brain stroke in November. We will continue Plavix. 7. CAD/hypertension. His troponin I is negative x2. He denies chest pain or anginal equivalent. We will continue Plavix and Coreg. 8. Diabetes mellitus. We'll continue chronic therapy. Hemoglobin A1c in November 2014 was 7.0. 9. Chronic anemia. Anemia panel in November 2014 revealed vitamin B12 of 1392, folate of 19.6, total iron of 52, TIBC of 186, and ferritin of 154.     Plan: 1. Continue IV fluid hydration. 2. We'll hold oral Dilantin. We'll ask neurology to opine about restarting it. Continue Tegretol. 3. Restart if Lantus and Exenatide. We'll add sliding scale NovoLog. 4. Restart cardiac medications. 5. Neuro checks every 4 hours x24 hours. 6. We'll check phenytoin levels daily. 7. Will assess his thyroid function with TSH and free T4. 8. PT consult/evaluation.    Code Status: Full code Family Communication: Discussed with the patient's daughter Disposition Plan: To be determined, PT evaluation pending in the next 24-48 hours.  Time spent: One hour  [facility] Pager [phone]

## 2013-02-13 NOTE — ED Notes (Signed)
Pt belongings and rings (3) given to family.

## 2013-02-13 NOTE — ED Notes (Signed)
Pt to department via EMS from [facility]- staff reports that the patient was last seen normal at 1140 this morning. State that they came back to check on him and noticed that he had slurred speech. HR-62 CBg-238 Bp-134/69

## 2013-02-13 NOTE — ED Notes (Signed)
Pt taken to MRI with this RN  

## 2013-02-13 NOTE — Progress Notes (Signed)
Patient arrived on unit from ED.  States he is experiencing no pain, SOB, or discomfort at this time.  Blood pressure 185/67 via Dinamap, rechecked manually 192/72.  Other vital signs stable.  MD notified, no additional orders at this time.  Will continue to monitor.

## 2013-02-13 NOTE — Consult Note (Signed)
Referring Physician: Gwendolyn Grant    Chief Complaint: dysarthria with possible cerebellar CVA  HPI:                                                                                                                                         [name] is an 79 y.o. male who was at [place] today. Patient was walking, feeding himself and taking part in all activities well. At 11:30 OT noted his speech became suddenly dysarthric.  Per OT/PT patient was not walked after dysarthria and EMS was immediately called.  On arrival to the ED patient was noted to have a mild left NL fold decrease, dysarthria, horizontal nystagmus, hyper metric saccadic movements and bilateral appendicular ataxia. Patietn was brought to CT head which was negative.  Patient was then brought to MRI for limited MR. MRI brain showed no acute stroke and patent intracranial vessels.  Dilantin level and Tegretol level have been ordered to evaluate for possible Dilantin toxicity causing above symptoms.   Date last known well: Date: 02/13/2013 Time last known well: Time: 11:30 tPA Given: No: no stroke seen on MRI  Past Medical History  Diagnosis Date  . CAD (coronary artery disease)     s/p cabg 3/10...s/p MI age 66  . Diabetes mellitus type II   . HTN (hypertension)   . HLD (hyperlipidemia)   . Anxiety disorder   . Tremor   . GERD (gastroesophageal reflux disease)   . Pulmonary vein stenosis   . Angina   . Chronic kidney disease   . Anxiety   . Myocardial infarction 1970's; 2010  . Aspiration pneumonia     History of aspiration pneumonia with vent-dependent respiratory/notes 06/12/2008 (12/26/2012)  . Orthopnoea     "just recently" (12/26/2012)  . History of stomach ulcers     "long time ago" (12/26/2012)  . Seizures     "I've had them all my life; last one was 04/2008" (12/26/2012)  . Chronic lower back pain   . Depression     "used to have problems w/depression" (12/26/2012)  . Fall at home 12/25/2012    "stumbled  over cord while I was blowing leaves; broke my right hip" (12/26/2012)    Past Surgical History  Procedure Laterality Date  . Coronary artery bypass graft  04/2008    "CABG X3" (12/26/2012)  . Inguinal hernia repair Right   . Cataract extraction w/ intraocular lens  implant, bilateral Bilateral ~2004  . Hip arthroplasty Right 12/26/2012    Procedure: Right Hip Hemiarthroplasty;  Surgeon: Sheral Apley, MD;  [facility]: [facility];  Service: Orthopedics;  Laterality: Right;    Family History  Problem Relation Age of Onset  . Coronary artery disease    . Hypertension     Social History:  reports that he has never smoked. He has quit using smokeless tobacco. His smokeless tobacco use included Chew. He reports  that he drinks alcohol. He reports that he does not use illicit drugs.  Allergies: No Known Allergies  Medications:                                                                                                                           Current Facility-Administered Medications  Medication Dose Route Frequency Provider Last Rate Last Dose  . 0.9 %  sodium chloride infusion  250 mL Intravenous Once Ulice Dash, PA-C      . alteplase (ACTIVASE) 1 mg/mL infusion 65 mg  0.9 mg/kg Intravenous Once Ulice Dash, PA-C       Current Outpatient Prescriptions  Medication Sig Dispense Refill  . aspirin 81 MG chewable tablet Chew 1 tablet (81 mg total) by mouth daily.      . carbamazepine (TEGRETOL) 200 MG tablet Take 1 tablet (200 mg total) by mouth every 12 (twelve) hours.  30 tablet  0  . carvedilol (COREG) 25 MG tablet Take 25 mg by mouth 2 (two) times daily with a meal.       . clopidogrel (PLAVIX) 75 MG tablet Take 1 tablet (75 mg total) by mouth daily with breakfast.  30 tablet  0  . Exenatide ER 2 MG PEN Inject 2 mg into the skin See admin instructions. Take on mondays nights      . HYDROcodone-acetaminophen (NORCO) 5-325 MG per tablet Take two tablets by mouth every 4 hours as  needed for pain.  360 tablet  0  . Insulin Glargine (LANTUS SOLOSTAR) 100 UNIT/ML SOPN Inject 25 Units into the skin at bedtime.      . mirtazapine (REMERON) 15 MG tablet Take 1 tablet (15 mg total) by mouth at bedtime.  10 tablet  0  . pantoprazole (PROTONIX) 40 MG tablet Take 40 mg by mouth daily.       . phenytoin (DILANTIN) 200 MG ER capsule Take 1 capsule (200 mg total) by mouth 2 (two) times daily.  60 capsule  0  . polyethylene glycol (MIRALAX / GLYCOLAX) packet Take 17 g by mouth daily.  14 each  0  . simvastatin (ZOCOR) 40 MG tablet Take 40 mg by mouth at bedtime.       . Tamsulosin HCl (FLOMAX) 0.4 MG CAPS Take 0.4 mg by mouth at bedtime.       Marland Kitchen tuberculin 5 UNIT/0.1ML injection Inject 5 Units into the skin See admin instructions. Every Sun and Friday for 3 days for first step PPD         ROS:  History obtained from the patient  General ROS: negative for - chills, fatigue, fever, night sweats, weight gain or weight loss Psychological ROS: negative for - behavioral disorder, hallucinations, memory difficulties, mood swings or suicidal ideation Ophthalmic ROS: positive for -  double vision,  ENT ROS: negative for - epistaxis, nasal discharge, oral lesions, sore throat, tinnitus or vertigo Allergy and Immunology ROS: negative for - hives or itchy/watery eyes Hematological and Lymphatic ROS: negative for - bleeding problems, bruising or swollen lymph nodes Endocrine ROS: negative for - galactorrhea, hair pattern changes, polydipsia/polyuria or temperature intolerance Respiratory ROS: negative for - cough, hemoptysis, shortness of breath or wheezing Cardiovascular ROS: negative for - chest pain, dyspnea on exertion, edema or irregular heartbeat Gastrointestinal ROS: negative for - abdominal pain, diarrhea, hematemesis, nausea/vomiting or stool  incontinence Genito-Urinary ROS: negative for - dysuria, hematuria, incontinence or urinary frequency/urgency Musculoskeletal ROS: negative for - joint swelling or muscular weakness Neurological ROS: as noted in HPI Dermatological ROS: negative for rash and skin lesion changes  Neurologic Examination:                                                                                                      Blood pressure 152/52, pulse 71, resp. rate 18, weight 72.576 kg (160 lb), SpO2 100.00%.  Mental Status: Alert, oriented, thought content appropriate.  Speech dysarthric without evidence of aphasia.  Able to follow 3 step commands without difficulty. Cranial Nerves: II: Discs flat bilaterally; Visual fields grossly normal, pupils equal, round, reactive to light and accommodation III,IV, VI: ptosis not present, extra-ocular motions intact bilaterally with hyper metric saccades, horizontal diplopia V,VII: face asymmetric on the left NL, facial light touch sensation normal bilaterally VIII: hearing normal bilaterally IX,X: gag reflex present XI: bilateral shoulder shrug XII: midline tongue extension without atrophy or fasciculations  Motor: Right : Upper extremity   5/5    Left:     Upper extremity   5/5  Lower extremity   5/5     Lower extremity   5/5 Tone and bulk:normal tone throughout; no atrophy noted Sensory: Pinprick and light touch intact throughout, bilaterally Deep Tendon Reflexes:  Right: Upper Extremity   Left: Upper extremity   biceps (C-5 to C-6) 2/4   biceps (C-5 to C-6) 2/4 tricep (C7) 2/4    triceps (C7) 2/4 Brachioradialis (C6) 2/4  Brachioradialis (C6) 2/4  Lower Extremity Lower Extremity  quadriceps (L-2 to L-4) 2/4   quadriceps (L-2 to L-4) 2/4 Achilles (S1) 2/4   Achilles (S1) 2/4  Plantars: Right: downgoing   Left: downgoing Cerebellar: ataxic finger-to-nose,  ataxic heel-to-shin test Gait: not tested due to sever ataxia CV: pulses palpable throughout     Results for orders placed during the hospital encounter of 02/13/13 (from the past 48 hour(s))  GLUCOSE, CAPILLARY     Status: Abnormal   Collection Time    02/13/13 12:34 PM      Result Value Range   Glucose-Capillary 232 (*) 70 - 99 mg/dL   Comment 1 Notify RN     Comment 2 Documented in Chart  POCT I-STAT TROPONIN I     Status: None   Collection Time    02/13/13 12:53 PM      Result Value Range   Troponin i, poc 0.00  0.00 - 0.08 ng/mL   Comment 3            Comment: Due to the release kinetics of cTnI,     a negative result within the first hours     of the onset of symptoms does not rule out     myocardial infarction with certainty.     If myocardial infarction is still suspected,     repeat the test at appropriate intervals.  POCT I-STAT, CHEM 8     Status: Abnormal   Collection Time    02/13/13 12:55 PM      Result Value Range   Sodium 133 (*) 137 - 147 mEq/L   Potassium 5.2  3.7 - 5.3 mEq/L   Chloride 101  96 - 112 mEq/L   BUN 33 (*) 6 - 23 mg/dL   Creatinine, Ser 8.29  0.50 - 1.35 mg/dL   Glucose, Bld 562 (*) 70 - 99 mg/dL   Calcium, Ion 1.30  8.65 - 1.30 mmol/L   TCO2 23  0 - 100 mmol/L   Hemoglobin 11.2 (*) 13.0 - 17.0 g/dL   HCT 78.4 (*) 69.6 - 29.5 %   Ct Head Wo Contrast  02/13/2013   CLINICAL DATA:  Sudden onset slurred speech. Code stroke. Question history of seizure  EXAM: CT HEAD WITHOUT CONTRAST  TECHNIQUE: Contiguous axial images were obtained from the base of the skull through the vertex without intravenous contrast.  COMPARISON:  01/06/2013  FINDINGS: Skull and Sinuses:No significant abnormality.  Orbits: Bilateral cataract resection.  Brain: No evidence of acute abnormality, such as acute infarction, hemorrhage, hydrocephalus, or mass lesion/mass effect. Unchanged appearance of remote small vessel injury in the right putamen and neighboring internal capsule. Cerebral volume loss which is age appropriate.  These results were called by telephone at  the time of interpretation on 02/13/2013 at 12:51 PM to Dr. Amada Jupiter, who verbally acknowledged these results.  IMPRESSION: No evidence of acute hemorrhage or large territory ischemia.   Electronically Signed   By: Tiburcio Pea M.D.   On: 02/13/2013 12:55    Assessment and plan discussed with with attending physician and they are in agreement.    Felicie Morn PA-C [facility] [phone]  02/13/2013, 1:20 PM   Assessment: 79 y.o. male with a history of seizures was in rehabilitation for hip. He became dysarthric around 11:30, by the time my evaluation he also demonstrated gross ataxia bilaterally as well as nystagmus. Given the bilateral nature of his symptoms, he was taken for an MRI to rule in stroke. This was negative.   His corrected Dilantin level is 27.4, which is in the toxic range, but he is also on another sodium channel blocker which can worsen side effects of other sodium channel blockers. This explains his nystagmus.   Stroke Risk Factors - diabetes mellitus, hyperlipidemia, hypertension and CAD,   1) continue carbamazepine 400 mg each bedtime 2) hold Dilantin today, check level tomorrow morning 3) will continue to follow.     Ritta Slot, MD [facility] [phone]  If 7pm- 7am, please page neurology on call at [phone].

## 2013-02-13 NOTE — ED Provider Notes (Signed)
CSN: [id number]     Arrival date & time 02/13/13  1227 History   First MD Initiated Contact with Patient 02/13/13 1231     Chief Complaint  Patient presents with  . Code Stroke   (Consider location/radiation/quality/duration/timing/severity/associated sxs/prior Treatment) HPI Comments: 75M noted to have slurred speech at rehab. Also ataxic.  Patient is a 79 y.o. male presenting with neurologic complaint. The history is provided by the patient.  Neurologic Problem This is a new problem. The current episode started 3 to 5 hours ago. The problem occurs constantly. The problem has not changed since onset.Pertinent negatives include no chest pain, no abdominal pain, no headaches and no shortness of breath. Nothing aggravates the symptoms. Nothing relieves the symptoms. He has tried nothing for the symptoms.    Past Medical History  Diagnosis Date  . CAD (coronary artery disease)     s/p cabg 3/10...s/p MI age 65  . Diabetes mellitus type II   . HTN (hypertension)   . HLD (hyperlipidemia)   . Anxiety disorder   . Tremor   . GERD (gastroesophageal reflux disease)   . Pulmonary vein stenosis   . Angina   . Chronic kidney disease   . Anxiety   . Myocardial infarction 1970's; 2010  . Aspiration pneumonia     History of aspiration pneumonia with vent-dependent respiratory/notes 06/12/2008 (12/26/2012)  . Orthopnoea     "just recently" (12/26/2012)  . History of stomach ulcers     "long time ago" (12/26/2012)  . Seizures     "I've had them all my life; last one was 04/2008" (12/26/2012)  . Chronic lower back pain   . Depression     "used to have problems w/depression" (12/26/2012)  . Fall at home 12/25/2012    "stumbled over cord while I was blowing leaves; broke my right hip" (12/26/2012)   Past Surgical History  Procedure Laterality Date  . Coronary artery bypass graft  04/2008    "CABG X3" (12/26/2012)  . Inguinal hernia repair Right   . Cataract extraction w/ intraocular lens   implant, bilateral Bilateral ~2004  . Hip arthroplasty Right 12/26/2012    Procedure: Right Hip Hemiarthroplasty;  Surgeon: Sheral Apley, MD;  Location: [facility] OR;  Service: Orthopedics;  Laterality: Right;   Family History  Problem Relation Age of Onset  . Coronary artery disease    . Hypertension     History  Substance Use Topics  . Smoking status: Never Smoker   . Smokeless tobacco: Former User    Types: Chew     Comment: 12/26/2012 "quit chewing 6-7 years ago"  . Alcohol Use: 0.0 oz/week     Comment: 12/26/2012 "used to drink; never had problems w/it; quit > 30 yrs ago"    Review of Systems  Constitutional: Negative for fever.  Respiratory: Negative for shortness of breath.   Cardiovascular: Negative for chest pain.  Gastrointestinal: Negative for abdominal pain.  Neurological: Negative for headaches.  All other systems reviewed and are negative.    Allergies  Review of patient's allergies indicates no known allergies.  Home Medications   Current Outpatient Rx  Name  Route  Sig  Dispense  Refill  . aspirin 81 MG chewable tablet   Oral   Chew 1 tablet (81 mg total) by mouth daily.         . carbamazepine (TEGRETOL) 200 MG tablet   Oral   Take 1 tablet (200 mg total) by mouth every 12 (twelve) hours.  30 tablet   0   . carvedilol (COREG) 25 MG tablet   Oral   Take 25 mg by mouth 2 (two) times daily with a meal.          . clopidogrel (PLAVIX) 75 MG tablet   Oral   Take 1 tablet (75 mg total) by mouth daily with breakfast.   30 tablet   0   . Exenatide ER 2 MG PEN   Subcutaneous   Inject 2 mg into the skin See admin instructions. Take on mondays nights         . HYDROcodone-acetaminophen (NORCO) 5-325 MG per tablet      Take two tablets by mouth every 4 hours as needed for pain.   360 tablet   0   . Insulin Glargine (LANTUS SOLOSTAR) 100 UNIT/ML SOPN   Subcutaneous   Inject 25 Units into the skin at bedtime.         . mirtazapine  (REMERON) 15 MG tablet   Oral   Take 1 tablet (15 mg total) by mouth at bedtime.   10 tablet   0   . pantoprazole (PROTONIX) 40 MG tablet   Oral   Take 40 mg by mouth daily.          . phenytoin (DILANTIN) 200 MG ER capsule   Oral   Take 1 capsule (200 mg total) by mouth 2 (two) times daily.   60 capsule   0   . polyethylene glycol (MIRALAX / GLYCOLAX) packet   Oral   Take 17 g by mouth daily.   14 each   0   . simvastatin (ZOCOR) 40 MG tablet   Oral   Take 40 mg by mouth at bedtime.          . Tamsulosin HCl (FLOMAX) 0.4 MG CAPS   Oral   Take 0.4 mg by mouth at bedtime.          Marland Kitchen tuberculin 5 UNIT/0.1ML injection   Intradermal   Inject 5 Units into the skin See admin instructions. Every Sun and Friday for 3 days for first step PPD          BP 153/65  Pulse 63  Resp 17  Wt 160 lb (72.576 kg)  SpO2 100% Physical Exam  Nursing note and vitals reviewed. Constitutional: He is oriented to person, place, and time. He appears well-developed and well-nourished. No distress.  HENT:  Head: Normocephalic and atraumatic.  Mouth/Throat: No oropharyngeal exudate.  Eyes: EOM are normal. Pupils are equal, round, and reactive to light.  Neck: Normal range of motion. Neck supple.  Cardiovascular: Normal rate and regular rhythm.  Exam reveals no friction rub.   No murmur heard. Pulmonary/Chest: Effort normal and breath sounds normal. No respiratory distress. He has no wheezes. He has no rales.  Abdominal: He exhibits no distension. There is no tenderness. There is no rebound.  Musculoskeletal: Normal range of motion. He exhibits no edema.  Neurological: He is alert and oriented to person, place, and time. A cranial nerve deficit (Dysarthria) is present. No sensory deficit. He exhibits normal muscle tone. GCS eye subscore is 4. GCS verbal subscore is 5. GCS motor subscore is 6.  Asterixis noted   Skin: He is not diaphoretic.    ED Course  Procedures (including  critical care time) Labs Review Labs Reviewed  CBC - Abnormal; Notable for the following:    RBC 3.48 (*)    Hemoglobin 10.9 (*)  HCT 33.2 (*)    All other components within normal limits  COMPREHENSIVE METABOLIC PANEL - Abnormal; Notable for the following:    Sodium 134 (*)    Potassium 5.5 (*)    Glucose, Bld 239 (*)    BUN 30 (*)    Albumin 3.1 (*)    Alkaline Phosphatase 135 (*)    Total Bilirubin 0.2 (*)    GFR calc non Af Amer 54 (*)    GFR calc Af Amer 63 (*)    All other components within normal limits  GLUCOSE, CAPILLARY - Abnormal; Notable for the following:    Glucose-Capillary 232 (*)    All other components within normal limits  POCT I-STAT, CHEM 8 - Abnormal; Notable for the following:    Sodium 133 (*)    BUN 33 (*)    Glucose, Bld 247 (*)    Hemoglobin 11.2 (*)    HCT 33.0 (*)    All other components within normal limits  ETHANOL  PROTIME-INR  APTT  DIFFERENTIAL  TROPONIN I  PHENYTOIN LEVEL, TOTAL  CARBAMAZEPINE LEVEL, TOTAL  URINE RAPID DRUG SCREEN (HOSP PERFORMED)  URINALYSIS, ROUTINE W REFLEX MICROSCOPIC  POCT I-STAT TROPONIN I   Imaging Review Ct Head Wo Contrast  02/13/2013   CLINICAL DATA:  Sudden onset slurred speech. Code stroke. Question history of seizure  EXAM: CT HEAD WITHOUT CONTRAST  TECHNIQUE: Contiguous axial images were obtained from the base of the skull through the vertex without intravenous contrast.  COMPARISON:  01/06/2013  FINDINGS: Skull and Sinuses:No significant abnormality.  Orbits: Bilateral cataract resection.  Brain: No evidence of acute abnormality, such as acute infarction, hemorrhage, hydrocephalus, or mass lesion/mass effect. Unchanged appearance of remote small vessel injury in the right putamen and neighboring internal capsule. Cerebral volume loss which is age appropriate.  These results were called by telephone at the time of interpretation on 02/13/2013 at 12:51 PM to Dr. Amada Jupiter, who verbally acknowledged these  results.  IMPRESSION: No evidence of acute hemorrhage or large territory ischemia.   Electronically Signed   By: Tiburcio Pea M.D.   On: 02/13/2013 12:55   Mr Brain Wo Contrast  02/13/2013   CLINICAL DATA:  79 year old male with slurred speech and ataxia. Initial encounter. Stroke versus Dilantin toxicity. Code stroke.  EXAM: MRI HEAD WITHOUT CONTRAST  MRA HEAD WITHOUT CONTRAST  TECHNIQUE: Multiplanar, multiecho pulse sequences of the brain and surrounding structures were obtained without intravenous contrast. Angiographic images of the head were obtained using MRA technique without contrast.  COMPARISON:  Head CT without contrast 1236 hr the same day. Brain MRI and MRA 01/10/2013 and earlier.  FINDINGS: MRI HEAD FINDINGS  There is a small crescentic area of increased trace diffusion near the right caudate and anterior limb of the external capsule. This corresponds to the mild T2 and FLAIR hyperintensity seen in November. No restricted diffusion, and there is Stable decreased T1 signal here. No restricted diffusion or evidence of acute infarction.  Major intracranial vascular flow voids are stable. No ventriculomegaly. No midline shift, mass effect, or evidence of intracranial mass lesion. No acute intracranial hemorrhage identified. Intermittent mild motion artifact on today's study. Stable gray and white matter signal throughout the brain. Negative pituitary, cervicomedullary junction, visualized cervical spine common bone marrow signal.  Stable orbits soft tissues. Stable paranasal sinuses and mastoids. Visualized scalp soft tissues are within normal limits.  MRA HEAD FINDINGS  Today's images are intermittently degraded by motion.  Stable distal vertebral arteries. Patent PICA  origins. Stable vertebrobasilar junction. Patent basilar artery with atherosclerotic irregularity and up to mild stenosis. Stable SCA origins. Fetal type bilateral PCA origins again noted. "Duplicated" left PCA again evident. Distal  PCA branches today are somewhat obscured by motion.  Stable antegrade flow in both ICA siphons, detail mildly obscured by motion artifact today. Ophthalmic and posterior communicating artery origins appear stable. Carotid termini, MCA and ACA origins are stable and within normal limits. Visualized bilateral ACA and MCA branches are stable.  IMPRESSION: 1.  No acute intracranial abnormality. 2. Late subacute to early chronic lacunar infarct near the right caudate and external capsule. 3. Stable intracranial MRA with atherosclerosis, but no proximal stenosis or major circle of Willis branch occlusion. Study reviewed in person with Dr. Amada Jupiter on 02/13/2013 at 1350 hrs.   Electronically Signed   By: Augusto Gamble M.D.   On: 02/13/2013 14:43    EKG Interpretation    Date/Time:  Tuesday February 13 2013 12:44:20 EST Ventricular Rate:  62 PR Interval:  93 QRS Duration: 105 QT Interval:  417 QTC Calculation: 423 R Axis:   12 Text Interpretation:  Sinus rhythm Short PR interval Abnormal lateral Q waves Anterior infarct, old ST depr, consider ischemia, anterolateral lds Similar to previous Confirmed by Gwendolyn Grant  MD, Lakoda Mcanany ([id number]) on 02/13/2013 12:49:11 PM            MDM   1. Dilantin toxicity, initial encounter    He male presents with dysarthria. He is coded as a code stroke because his last seen normal around 1140 this morning. Neuro is present at bedside and patient was treated to the scanner. Neuro had him urgently MRI which was negative. Neuro do further investigation talking with staff and he was noted to be ataxic for the past few days. He also has bilateral ataxia with his limbs and some mild asterixis. They believe that this is secondary to supratherapeutic Dilantin or Tegretol. Labs show normal white count, mildly dry with a BUN of 33. His Dilantin is in normal range, but when corrected for his low albumin, he is Dilantin toxic. Patient to be admitted for his Dilantin  toxicity.    Dagmar Hait, MD 02/13/13 [phone]

## 2013-02-14 DIAGNOSIS — N400 Enlarged prostate without lower urinary tract symptoms: Secondary | ICD-10-CM

## 2013-02-14 DIAGNOSIS — I2510 Atherosclerotic heart disease of native coronary artery without angina pectoris: Secondary | ICD-10-CM

## 2013-02-14 DIAGNOSIS — R269 Unspecified abnormalities of gait and mobility: Secondary | ICD-10-CM

## 2013-02-14 DIAGNOSIS — R569 Unspecified convulsions: Secondary | ICD-10-CM

## 2013-02-14 LAB — CBC
Hemoglobin: 9.6 g/dL — ABNORMAL LOW (ref 13.0–17.0)
RBC: 3.12 MIL/uL — ABNORMAL LOW (ref 4.22–5.81)
RDW: 13.4 % (ref 11.5–15.5)
WBC: 6.9 10*3/uL (ref 4.0–10.5)

## 2013-02-14 LAB — GLUCOSE, CAPILLARY
Glucose-Capillary: 86 mg/dL (ref 70–99)
Glucose-Capillary: 227 mg/dL — ABNORMAL HIGH (ref 70–99)
Glucose-Capillary: 147 mg/dL — ABNORMAL HIGH (ref 70–99)
Glucose-Capillary: 111 mg/dL — ABNORMAL HIGH (ref 70–99)

## 2013-02-14 LAB — MRSA PCR SCREENING: MRSA by PCR: NEGATIVE

## 2013-02-14 LAB — BASIC METABOLIC PANEL
CO2: 26 mEq/L (ref 19–32)
Glucose, Bld: 70 mg/dL (ref 70–99)
Potassium: 5.5 mEq/L — ABNORMAL HIGH (ref 3.7–5.3)
Sodium: 139 mEq/L (ref 137–147)

## 2013-02-14 LAB — TSH: TSH: 2.44 u[IU]/mL (ref 0.350–4.500)

## 2013-02-14 LAB — PHENYTOIN LEVEL, TOTAL: Phenytoin Lvl: 17.3 ug/mL (ref 10.0–20.0)

## 2013-02-14 MED ORDER — SODIUM CHLORIDE 0.9 % IV SOLN
1000.0000 mg | INTRAVENOUS | Status: AC
  Administered 2013-02-14: 1000 mg via INTRAVENOUS
  Filled 2013-02-14: qty 10

## 2013-02-14 MED ORDER — LEVETIRACETAM 500 MG PO TABS
500.0000 mg | ORAL_TABLET | Freq: Two times a day (BID) | ORAL | Status: DC
  Administered 2013-02-14 – 2013-02-15 (×2): 500 mg via ORAL
  Filled 2013-02-14 (×3): qty 1

## 2013-02-14 NOTE — Progress Notes (Signed)
Per PT note, patient will be going home with home health. CSW signing off at this time. Please re consult if social work needs arise.  Maree Krabbe, MSW, Theresia Majors [phone]

## 2013-02-14 NOTE — Progress Notes (Signed)
[facility] PROGRESS NOTE  HAGEN BOHORQUEZ ZOX:[id number] DOB: Jan 26, 1929 DOA: 02/13/2013 PCP: [name], MD  Assessment/Plan: 1. Ataxia and dysarthria, likely secondary to Dilantin toxicity. He had no lower ataxic or dysarthric, but he did have mild nystagmus on exam. Otherwise, his exam was nonfocal on admit. See below 2. Dilantin toxicity. Neurology had evaluated the patient in the ED for a code stroke, but his head MRI revealed no new acute strokes. The neurology team calculated his corrected Dilantin level as 27.4, clearly in the toxic range. Cont to hold his oral Dilantin until further recommendations by neurology. 3. Long-standing seizure disorder. As above, we will hold oral Dilantin but will continue Tegretol. We'll ask neurology for further recommendations regarding AED's 4. Hyperkalemia. The patient has had a history of hyperkalemia. With IV fluids, his followup serum potassium is high normal at 5.2. 5. Hyponatremia, possibly secondary to dehydration. The patient's BUN/creatinine ratio is indicative of dehydration/volume depletion. Will continue IV hydration started in the ED. 6. Recent history of acute right brain stroke in November. We will continue Plavix. 7. CAD/hypertension. His troponin I is negative x2. He denies chest pain or anginal equivalent. We will continue Plavix and Coreg. 8. Diabetes mellitus. We'll continue chronic therapy. Hemoglobin A1c in November 2014 was 7.0. 9. Chronic anemia. Anemia panel in November 2014 revealed vitamin B12 of 1392, folate of 19.6, total iron of 52, TIBC of 186, and ferritin of 154.  Code Status: Full Family Communication: Pt in room (indicate person spoken with, relationship, and if by phone, the number) Disposition Plan: Pending   Consultants:  Neurology  HPI/Subjective: Feeling better. No complaints  Objective: Filed Vitals:   02/13/13 1716 02/13/13 2009 02/14/13 0434 02/14/13 0651  BP: 175/58 148/55 132/72 127/73   Pulse: 67 68 65 76  Temp: 97.1 F (36.2 C) 98.2 F (36.8 C) 98.7 F (37.1 C)   TempSrc:  Oral Axillary   Resp: 20 20 19    Height:   6' (1.829 m)   Weight:   73.1 kg (161 lb 2.5 oz)   SpO2: 100% 100% 98%     Intake/Output Summary (Last 24 hours) at 02/14/13 1006 Last data filed at 02/14/13 0436  Gross per 24 hour  Intake 301.25 ml  Output    525 ml  Net -223.75 ml   Filed Weights   02/13/13 1248 02/14/13 0434  Weight: 72.576 kg (160 lb) 73.1 kg (161 lb 2.5 oz)    Exam:   General:  Awake, in nad  Cardiovascular: regular, s1, s2  Respiratory: normal resp effort, no wheezing  Abdomen: soft, nondistended  Musculoskeletal: perfused, no clubbing   Data Reviewed: Basic Metabolic Panel:  Recent Labs Lab 02/13/13 1235 02/13/13 1255 02/14/13 0529  NA 134* 133* 139  K 5.5* 5.2 5.5*  CL 97 101 103  CO2 22  --  26  GLUCOSE 239* 247* 70  BUN 30* 33* 23  CREATININE 1.21 1.30 1.04  CALCIUM 8.6  --  8.5   Liver Function Tests:  Recent Labs Lab 02/13/13 1235  AST 17  ALT 17  ALKPHOS 135*  BILITOT 0.2*  PROT 7.1  ALBUMIN 3.1*   No results found for this basename: LIPASE, AMYLASE,  in the last 168 hours No results found for this basename: AMMONIA,  in the last 168 hours CBC:  Recent Labs Lab 02/13/13 1235 02/13/13 1255 02/14/13 0529  WBC 10.5  --  6.9  NEUTROABS 6.5  --   --   HGB 10.9* 11.2*  9.6*  HCT 33.2* 33.0* 29.6*  MCV 95.4  --  94.9  PLT 312  --  282   Cardiac Enzymes:  Recent Labs Lab 02/13/13 1235  TROPONINI <0.30   BNP (last 3 results) No results found for this basename: PROBNP,  in the last 8760 hours CBG:  Recent Labs Lab 02/13/13 1234 02/13/13 1854 02/13/13 2139 02/14/13 0604 02/14/13 0646  GLUCAP 232* 131* 181* 86 77    Recent Results (from the past 240 hour(s))  MRSA PCR SCREENING     Status: None   Collection Time    02/14/13  5:52 AM      Result Value Range Status   MRSA by PCR NEGATIVE  NEGATIVE Final    Comment:            The GeneXpert MRSA Assay (FDA     approved for NASAL specimens     only), is one component of a     comprehensive MRSA colonization     surveillance program. It is not     intended to diagnose MRSA     infection nor to guide or     monitor treatment for     MRSA infections.     Studies: Ct Head Wo Contrast  02/13/2013   CLINICAL DATA:  Sudden onset slurred speech. Code stroke. Question history of seizure  EXAM: CT HEAD WITHOUT CONTRAST  TECHNIQUE: Contiguous axial images were obtained from the base of the skull through the vertex without intravenous contrast.  COMPARISON:  01/06/2013  FINDINGS: Skull and Sinuses:No significant abnormality.  Orbits: Bilateral cataract resection.  Brain: No evidence of acute abnormality, such as acute infarction, hemorrhage, hydrocephalus, or mass lesion/mass effect. Unchanged appearance of remote small vessel injury in the right putamen and neighboring internal capsule. Cerebral volume loss which is age appropriate.  These results were called by telephone at the time of interpretation on 02/13/2013 at 12:51 PM to Dr. Amada Jupiter, who verbally acknowledged these results.  IMPRESSION: No evidence of acute hemorrhage or large territory ischemia.   Electronically Signed   By: Tiburcio Pea M.D.   On: 02/13/2013 12:55   Mr Shirlee Latch Wo Contrast  02/13/2013   CLINICAL DATA:  79 year old male with slurred speech and ataxia. Initial encounter. Stroke versus Dilantin toxicity. Code stroke.  EXAM: MRI HEAD WITHOUT CONTRAST  MRA HEAD WITHOUT CONTRAST  TECHNIQUE: Multiplanar, multiecho pulse sequences of the brain and surrounding structures were obtained without intravenous contrast. Angiographic images of the head were obtained using MRA technique without contrast.  COMPARISON:  Head CT without contrast 1236 hr the same day. Brain MRI and MRA 01/10/2013 and earlier.  FINDINGS: MRI HEAD FINDINGS  There is a small crescentic area of increased trace  diffusion near the right caudate and anterior limb of the external capsule. This corresponds to the mild T2 and FLAIR hyperintensity seen in November. No restricted diffusion, and there is Stable decreased T1 signal here. No restricted diffusion or evidence of acute infarction.  Major intracranial vascular flow voids are stable. No ventriculomegaly. No midline shift, mass effect, or evidence of intracranial mass lesion. No acute intracranial hemorrhage identified. Intermittent mild motion artifact on today's study. Stable gray and white matter signal throughout the brain. Negative pituitary, cervicomedullary junction, visualized cervical spine common bone marrow signal.  Stable orbits soft tissues. Stable paranasal sinuses and mastoids. Visualized scalp soft tissues are within normal limits.  MRA HEAD FINDINGS  Today's images are intermittently degraded by motion.  Stable distal vertebral arteries.  Patent PICA origins. Stable vertebrobasilar junction. Patent basilar artery with atherosclerotic irregularity and up to mild stenosis. Stable SCA origins. Fetal type bilateral PCA origins again noted. "Duplicated" left PCA again evident. Distal PCA branches today are somewhat obscured by motion.  Stable antegrade flow in both ICA siphons, detail mildly obscured by motion artifact today. Ophthalmic and posterior communicating artery origins appear stable. Carotid termini, MCA and ACA origins are stable and within normal limits. Visualized bilateral ACA and MCA branches are stable.  IMPRESSION: 1.  No acute intracranial abnormality. 2. Late subacute to early chronic lacunar infarct near the right caudate and external capsule. 3. Stable intracranial MRA with atherosclerosis, but no proximal stenosis or major circle of Willis branch occlusion. Study reviewed in person with Dr. Amada Jupiter on 02/13/2013 at 1350 hrs.   Electronically Signed   By: Augusto Gamble M.D.   On: 02/13/2013 14:43   Mr Brain Wo Contrast  02/13/2013    CLINICAL DATA:  79 year old male with slurred speech and ataxia. Initial encounter. Stroke versus Dilantin toxicity. Code stroke.  EXAM: MRI HEAD WITHOUT CONTRAST  MRA HEAD WITHOUT CONTRAST  TECHNIQUE: Multiplanar, multiecho pulse sequences of the brain and surrounding structures were obtained without intravenous contrast. Angiographic images of the head were obtained using MRA technique without contrast.  COMPARISON:  Head CT without contrast 1236 hr the same day. Brain MRI and MRA 01/10/2013 and earlier.  FINDINGS: MRI HEAD FINDINGS  There is a small crescentic area of increased trace diffusion near the right caudate and anterior limb of the external capsule. This corresponds to the mild T2 and FLAIR hyperintensity seen in November. No restricted diffusion, and there is Stable decreased T1 signal here. No restricted diffusion or evidence of acute infarction.  Major intracranial vascular flow voids are stable. No ventriculomegaly. No midline shift, mass effect, or evidence of intracranial mass lesion. No acute intracranial hemorrhage identified. Intermittent mild motion artifact on today's study. Stable gray and white matter signal throughout the brain. Negative pituitary, cervicomedullary junction, visualized cervical spine common bone marrow signal.  Stable orbits soft tissues. Stable paranasal sinuses and mastoids. Visualized scalp soft tissues are within normal limits.  MRA HEAD FINDINGS  Today's images are intermittently degraded by motion.  Stable distal vertebral arteries. Patent PICA origins. Stable vertebrobasilar junction. Patent basilar artery with atherosclerotic irregularity and up to mild stenosis. Stable SCA origins. Fetal type bilateral PCA origins again noted. "Duplicated" left PCA again evident. Distal PCA branches today are somewhat obscured by motion.  Stable antegrade flow in both ICA siphons, detail mildly obscured by motion artifact today. Ophthalmic and posterior communicating artery origins  appear stable. Carotid termini, MCA and ACA origins are stable and within normal limits. Visualized bilateral ACA and MCA branches are stable.  IMPRESSION: 1.  No acute intracranial abnormality. 2. Late subacute to early chronic lacunar infarct near the right caudate and external capsule. 3. Stable intracranial MRA with atherosclerosis, but no proximal stenosis or major circle of Willis branch occlusion. Study reviewed in person with Dr. Amada Jupiter on 02/13/2013 at 1350 hrs.   Electronically Signed   By: Augusto Gamble M.D.   On: 02/13/2013 14:43    Scheduled Meds: . carbamazepine  400 mg Oral QHS  . carvedilol  25 mg Oral BID WC  . clopidogrel  75 mg Oral Q breakfast  . enoxaparin (LOVENOX) injection  40 mg Subcutaneous Q24H  . Exenatide ER  2 mg Subcutaneous Weekly  . insulin aspart  0-5 Units Subcutaneous QHS  . insulin  aspart  0-9 Units Subcutaneous TID WC  . insulin glargine  25 Units Subcutaneous QHS  . levETIRAcetam  1,000 mg Intravenous STAT  . levETIRAcetam  500 mg Oral BID  . mirtazapine  15 mg Oral QHS  . pantoprazole  40 mg Oral Daily  . polyethylene glycol  17 g Oral Daily  . senna  1 tablet Oral QHS  . sertraline  100 mg Oral Daily  . simvastatin  40 mg Oral QHS  . tamsulosin  0.4 mg Oral QHS   Continuous Infusions: . sodium chloride 75 mL/hr at 02/13/13 1822    Principal Problem:   Dilantin toxicity Active Problems:   CORONARY ATHEROSCLEROSIS NATIVE CORONARY ARTERY   SEIZURE DISORDER   ATAXIA   Anemia   Hyponatremia   Hyperkalemia   Diabetes mellitus type 2 in nonobese   Dysarthria   HTN (hypertension)   Dehydration   History of stroke  Time spent:  CHIU, STEPHEN K  [facility] Pager [phone]. If 7PM-7AM, please contact night-coverage at [email or web address], password [facility] 02/14/2013, 10:06 AM  LOS: 1 day

## 2013-02-14 NOTE — Evaluation (Signed)
Physical Therapy Evaluation Patient Details Name: [name] MRN: [id number] DOB: 02/07/1929 Today's Date: 02/14/2013 Time: 4098-1191 PT Time Calculation (min): 20 min  PT Assessment / Plan / Recommendation History of Present Illness  Pt is an 79 y/o male admitted s/p R hip hemiarthroplasty. D/C to [facility] for rehab. Admitted over the weekend for MS changes and seizures. ? sm BG CVA. Determined to be caused by dilantin toxicity.  Clinical Impression  Pt presented with ataxia and nystagmus, but has improved significantly with mild gait unsteadiness at this point.  Will continue to see for further rehab of hip and to improve conditioning.  Will get HHPT to see at his home after D/C.    PT Assessment  Patient needs continued PT services    Follow Up Recommendations  Home health PT;Supervision - Intermittent    Does the patient have the potential to tolerate intense rehabilitation      Barriers to Discharge        Equipment Recommendations  None recommended by PT    Recommendations for Other Services     Frequency Min 3X/week    Precautions / Restrictions Restrictions Weight Bearing Restrictions: No   Pertinent Vitals/Pain       Mobility  Bed Mobility Bed Mobility: Not assessed Transfers Transfers: Sit to Stand;Stand to Sit Sit to Stand: 5: Supervision;With upper extremity assist;From chair/3-in-1 Stand to Sit: 6: Modified independent (Device/Increase time);With upper extremity assist;To chair/3-in-1 Details for Transfer Assistance: cues for safe use of UE Ambulation/Gait Ambulation/Gait Assistance: 5: Supervision Ambulation Distance (Feet): 180 Feet Assistive device: Rolling walker Gait Pattern: Step-through pattern Stairs: No    Exercises     PT Diagnosis: Generalized weakness  PT Problem List: Decreased strength;Decreased knowledge of use of DME;Decreased safety awareness;Decreased knowledge of precautions;Decreased range of motion;Decreased  mobility PT Treatment Interventions: DME instruction;Gait training;Stair training;Functional mobility training;Therapeutic activities;Patient/family education     PT Goals(Current goals can be found in the care plan section) Acute Rehab PT Goals Patient Stated Goal: Home as soon as I can safely PT Goal Formulation: With patient Time For Goal Achievement: 02/21/13 Potential to Achieve Goals: Good  Visit Information  Last PT Received On: 02/14/13 Assistance Needed: +1 History of Present Illness: Pt is an 79 y/o male admitted s/p R hip hemiarthroplasty. D/C to [facility] for rehab. Admitted over the weekend for MS changes and seizures. ? sm BG CVA. Determined to be caused by dilantin toxicity.       Prior Functioning  Home Living Family/patient expects to be discharged to:: Private residence Living Arrangements: Spouse/significant other;Children Available Help at Discharge: Family;Available PRN/intermittently Type of Home: Mobile home Home Access: Ramped entrance Home Layout: One level Home Equipment: Walker - 2 wheels;Bedside commode;Shower seat;Wheelchair - manual Prior Function Level of Independence: Independent with assistive device(s) Comments: pt reports he was walking with RW without supervision at the SNF Communication Communication: No difficulties    Cognition  Cognition Arousal/Alertness: Awake/alert Behavior During Therapy: WFL for tasks assessed/performed Overall Cognitive Status: Within Functional Limits for tasks assessed    Extremity/Trunk Assessment Upper Extremity Assessment Upper Extremity Assessment: Overall WFL for tasks assessed Lower Extremity Assessment Lower Extremity Assessment: Overall WFL for tasks assessed   Balance Balance Balance Assessed: Yes Static Standing Balance Static Standing - Balance Support: Bilateral upper extremity supported;During functional activity Static Standing - Level of Assistance: 5: Stand by assistance  End of  Session PT - End of Session Activity Tolerance: Patient tolerated treatment well Patient left: in chair;with family/visitor  present Nurse Communication: Mobility status  GP     Abednego Yeates, Eliseo Gum 02/14/2013, 12:44 PM 02/14/2013  [place] Bing, PT [phone]  (pager)

## 2013-02-14 NOTE — Progress Notes (Signed)
Inpatient Diabetes Program Recommendations  AACE/ADA: New Consensus Statement on Inpatient Glycemic Control (2013)  Target Ranges:  Prepandial:   less than 140 mg/dL      Peak postprandial:   less than 180 mg/dL (1-2 hours)      Critically ill patients:  140 - 180 mg/dL   Although fasting CBG's in 70's is normal, it may be too low for this 79 year old. May want to decrease the lantus to 20 units at this time.  Thank you, Lenor Coffin, RN, CNS, Diabetes Coordinator [phone])

## 2013-02-14 NOTE — Progress Notes (Signed)
Clinical Social Work Department BRIEF PSYCHOSOCIAL ASSESSMENT 02/14/2013  Patient:  [name], [name]     Account Number:  [id number]     Admit date:  02/13/2013  Clinical Social Worker:  Carren Rang  Date/Time:  02/14/2013 11:00 AM  Referred by:  Physician  Date Referred:  02/13/2013 Referred for  SNF Placement   Other Referral:   Interview type:  Patient Other interview type:    PSYCHOSOCIAL DATA Living Status:  FAMILY Admitted from facility:   Level of care:   Primary support name:  Hancel Ion [phone] Primary support relationship to patient:  CHILD, ADULT Degree of support available:   Good    CURRENT CONCERNS Current Concerns  Post-Acute Placement   Other Concerns:    SOCIAL WORK ASSESSMENT / PLAN Clinical Social Worker received referral for that patient is from SNF.  CSW introduced self and explained reason for visit.  Patient reported he is from [facility], but wants to return home with his daughter, who he states he lives with.  CSW asked patient if social worker can call daughter to confirm that the daughter is able to take care of patient. Patient agreed. CSW will follow up with patient and patient's daughter and also await PT consult.   Assessment/plan status:  Psychosocial Support/Ongoing Assessment of Needs Other assessment/ plan:   Information/referral to community resources:   CSW information    PATIENT'S/FAMILY'S RESPONSE TO PLAN OF CARE: Patient states he wants to go home, instead of return back to the SNF. Patient stated he was about to be discharged from SNF.       Maree Krabbe, MSW, Theresia Majors [phone]

## 2013-02-14 NOTE — Progress Notes (Signed)
NEURO HOSPITALIST PROGRESS NOTE   SUBJECTIVE:                                                                                                                        Patient is doing much better today--speech is clear, no nystagmus with only slight dysmetria of UE.  Patient states he was placed on Dilantin for seizures but was never on any there medications than what he was on this admission.   OBJECTIVE:                                                                                                                           Vital signs in last 24 hours: Temp:  [97.1 F (36.2 C)-98.7 F (37.1 C)] 98.7 F (37.1 C) (12/31 0434) Pulse Rate:  [60-76] 76 (12/31 0651) Resp:  [13-20] 19 (12/31 0434) BP: (127-192)/(43-73) 127/73 mmHg (12/31 0651) SpO2:  [98 %-100 %] 98 % (12/31 0434) Weight:  [72.576 kg (160 lb)-73.1 kg (161 lb 2.5 oz)] 73.1 kg (161 lb 2.5 oz) (12/31 0434)  Intake/Output from previous day: 12/30 0701 - 12/31 0700 In: 301.3 [P.O.:240; I.V.:61.3] Out: 525 [Urine:525] Intake/Output this shift:   Nutritional status: Carb Control  Past Medical History  Diagnosis Date  . CAD (coronary artery disease)     s/p cabg 3/10...s/p MI age 61  . Diabetes mellitus type II   . HTN (hypertension)   . HLD (hyperlipidemia)   . Anxiety disorder   . Tremor   . GERD (gastroesophageal reflux disease)   . Pulmonary vein stenosis   . Angina   . Chronic kidney disease   . Anxiety   . Myocardial infarction 1970's; 2010  . Aspiration pneumonia     History of aspiration pneumonia with vent-dependent respiratory/notes 06/12/2008 (12/26/2012)  . Orthopnoea     "just recently" (12/26/2012)  . History of stomach ulcers     "long time ago" (12/26/2012)  . Seizures     "I've had them all my life; last one was 04/2008" (12/26/2012)  . Chronic lower back pain   . Depression     "used to have problems w/depression" (12/26/2012)  . Fall at home 12/25/2012     "stumbled over cord while I was blowing  leaves; broke my right hip" (12/26/2012)  . CVA (cerebral infarction) 01/07/2013    Acute right brain stroke  . Fracture of femoral neck, right 01/03/2013     Neurologic Exam:  Mental Status: Alert, oriented, thought content appropriate.  Speech fluent without evidence of aphasia.  Able to follow 3 step commands without difficulty. Cranial Nerves: II: Visual fields grossly normal, pupils equal, round, reactive to light and accommodation III,IV, VI: ptosis not present, extra-ocular motions intact bilaterally V,VII: smile symmetric, facial light touch sensation normal bilaterally VIII: hearing normal bilaterally IX,X: gag reflex present XI: bilateral shoulder shrug XII: midline tongue extension without atrophy or fasciculations  Motor: Right : Upper extremity   5/5    Left:     Upper extremity   5/5  Lower extremity   5/5     Lower extremity   5/5 Tone and bulk:normal tone throughout; no atrophy noted Sensory: Pinprick and light touch intact throughout, bilaterally Deep Tendon Reflexes:  Right: Upper Extremity   Left: Upper extremity   biceps (C-5 to C-6) 2/4   biceps (C-5 to C-6) 2/4 tricep (C7) 2/4    triceps (C7) 2/4 Brachioradialis (C6) 2/4  Brachioradialis (C6) 2/4  Lower Extremity Lower Extremity  quadriceps (L-2 to L-4) 2/4   quadriceps (L-2 to L-4) 2/4 Achilles (S1) 2/4   Achilles (S1) 2/4  Plantars: Right: downgoing   Left: downgoing Cerebellar: Slight ataxia with  finger-to-nose,  Continuee to have ataxia with  heel-to-shin test     Lab Results: Lab Results  Component Value Date/Time   CHOL 115 01/07/2013  4:45 AM   Lipid Panel No results found for this basename: CHOL, TRIG, HDL, CHOLHDL, VLDL, LDLCALC,  in the last 72 hours  Studies/Results: Ct Head Wo Contrast  02/13/2013   CLINICAL DATA:  Sudden onset slurred speech. Code stroke. Question history of seizure  EXAM: CT HEAD WITHOUT CONTRAST  TECHNIQUE: Contiguous  axial images were obtained from the base of the skull through the vertex without intravenous contrast.  COMPARISON:  01/06/2013  FINDINGS: Skull and Sinuses:No significant abnormality.  Orbits: Bilateral cataract resection.  Brain: No evidence of acute abnormality, such as acute infarction, hemorrhage, hydrocephalus, or mass lesion/mass effect. Unchanged appearance of remote small vessel injury in the right putamen and neighboring internal capsule. Cerebral volume loss which is age appropriate.  These results were called by telephone at the time of interpretation on 02/13/2013 at 12:51 PM to Dr. Amada Jupiter, who verbally acknowledged these results.  IMPRESSION: No evidence of acute hemorrhage or large territory ischemia.   Electronically Signed   By: Tiburcio Pea M.D.   On: 02/13/2013 12:55   Mr Shirlee Latch Wo Contrast  02/13/2013   CLINICAL DATA:  79 year old male with slurred speech and ataxia. Initial encounter. Stroke versus Dilantin toxicity. Code stroke.  EXAM: MRI HEAD WITHOUT CONTRAST  MRA HEAD WITHOUT CONTRAST  TECHNIQUE: Multiplanar, multiecho pulse sequences of the brain and surrounding structures were obtained without intravenous contrast. Angiographic images of the head were obtained using MRA technique without contrast.  COMPARISON:  Head CT without contrast 1236 hr the same day. Brain MRI and MRA 01/10/2013 and earlier.  FINDINGS: MRI HEAD FINDINGS  There is a small crescentic area of increased trace diffusion near the right caudate and anterior limb of the external capsule. This corresponds to the mild T2 and FLAIR hyperintensity seen in November. No restricted diffusion, and there is Stable decreased T1 signal here. No restricted diffusion or evidence of acute infarction.  Major  intracranial vascular flow voids are stable. No ventriculomegaly. No midline shift, mass effect, or evidence of intracranial mass lesion. No acute intracranial hemorrhage identified. Intermittent mild motion artifact on  today's study. Stable gray and white matter signal throughout the brain. Negative pituitary, cervicomedullary junction, visualized cervical spine common bone marrow signal.  Stable orbits soft tissues. Stable paranasal sinuses and mastoids. Visualized scalp soft tissues are within normal limits.  MRA HEAD FINDINGS  Today's images are intermittently degraded by motion.  Stable distal vertebral arteries. Patent PICA origins. Stable vertebrobasilar junction. Patent basilar artery with atherosclerotic irregularity and up to mild stenosis. Stable SCA origins. Fetal type bilateral PCA origins again noted. "Duplicated" left PCA again evident. Distal PCA branches today are somewhat obscured by motion.  Stable antegrade flow in both ICA siphons, detail mildly obscured by motion artifact today. Ophthalmic and posterior communicating artery origins appear stable. Carotid termini, MCA and ACA origins are stable and within normal limits. Visualized bilateral ACA and MCA branches are stable.  IMPRESSION: 1.  No acute intracranial abnormality. 2. Late subacute to early chronic lacunar infarct near the right caudate and external capsule. 3. Stable intracranial MRA with atherosclerosis, but no proximal stenosis or major circle of Willis branch occlusion. Study reviewed in person with Dr. Amada Jupiter on 02/13/2013 at 1350 hrs.   Electronically Signed   By: Augusto Gamble M.D.   On: 02/13/2013 14:43   Mr Brain Wo Contrast  02/13/2013   CLINICAL DATA:  79 year old male with slurred speech and ataxia. Initial encounter. Stroke versus Dilantin toxicity. Code stroke.  EXAM: MRI HEAD WITHOUT CONTRAST  MRA HEAD WITHOUT CONTRAST  TECHNIQUE: Multiplanar, multiecho pulse sequences of the brain and surrounding structures were obtained without intravenous contrast. Angiographic images of the head were obtained using MRA technique without contrast.  COMPARISON:  Head CT without contrast 1236 hr the same day. Brain MRI and MRA 01/10/2013 and  earlier.  FINDINGS: MRI HEAD FINDINGS  There is a small crescentic area of increased trace diffusion near the right caudate and anterior limb of the external capsule. This corresponds to the mild T2 and FLAIR hyperintensity seen in November. No restricted diffusion, and there is Stable decreased T1 signal here. No restricted diffusion or evidence of acute infarction.  Major intracranial vascular flow voids are stable. No ventriculomegaly. No midline shift, mass effect, or evidence of intracranial mass lesion. No acute intracranial hemorrhage identified. Intermittent mild motion artifact on today's study. Stable gray and white matter signal throughout the brain. Negative pituitary, cervicomedullary junction, visualized cervical spine common bone marrow signal.  Stable orbits soft tissues. Stable paranasal sinuses and mastoids. Visualized scalp soft tissues are within normal limits.  MRA HEAD FINDINGS  Today's images are intermittently degraded by motion.  Stable distal vertebral arteries. Patent PICA origins. Stable vertebrobasilar junction. Patent basilar artery with atherosclerotic irregularity and up to mild stenosis. Stable SCA origins. Fetal type bilateral PCA origins again noted. "Duplicated" left PCA again evident. Distal PCA branches today are somewhat obscured by motion.  Stable antegrade flow in both ICA siphons, detail mildly obscured by motion artifact today. Ophthalmic and posterior communicating artery origins appear stable. Carotid termini, MCA and ACA origins are stable and within normal limits. Visualized bilateral ACA and MCA branches are stable.  IMPRESSION: 1.  No acute intracranial abnormality. 2. Late subacute to early chronic lacunar infarct near the right caudate and external capsule. 3. Stable intracranial MRA with atherosclerosis, but no proximal stenosis or major circle of Willis branch occlusion. Study reviewed in  person with Dr. Amada Jupiter on 02/13/2013 at 1350 hrs.   Electronically  Signed   By: Augusto Gamble M.D.   On: 02/13/2013 14:43    MEDICATIONS                                                                                                                        Scheduled: . carbamazepine  400 mg Oral QHS  . carvedilol  25 mg Oral BID WC  . clopidogrel  75 mg Oral Q breakfast  . enoxaparin (LOVENOX) injection  40 mg Subcutaneous Q24H  . Exenatide ER  2 mg Subcutaneous Weekly  . insulin aspart  0-5 Units Subcutaneous QHS  . insulin aspart  0-9 Units Subcutaneous TID WC  . insulin glargine  25 Units Subcutaneous QHS  . levETIRAcetam  1,000 mg Intravenous STAT  . levETIRAcetam  500 mg Oral BID  . mirtazapine  15 mg Oral QHS  . pantoprazole  40 mg Oral Daily  . polyethylene glycol  17 g Oral Daily  . senna  1 tablet Oral QHS  . sertraline  100 mg Oral Daily  . simvastatin  40 mg Oral QHS  . tamsulosin  0.4 mg Oral QHS    ASSESSMENT/PLAN:                                                                                                            79 YO male presenting to ED as code stroke due to dysarthria, ataxia and diplopia.  MRI brain was negative and Dilantin level was 27.4, he is also on another sodium channel blocker which can worsen side effects of other sodium channel blockers. Today dilantin level is 24.0 and patient is much improved,  He no longer has dysarthria or nystagmus, ataxia of upper limbs are only slight.    Recommend: 1) D/C dilantin all together 2) Start Keppra 500 mg BID with 1000 mg load IV (done 3) Continue Tegretol at 400 mg qHS 4) PT/OT evaluation. 5) He will nee neurology follow up as out patient on discharge. [facility] [address], [place], [place]  [address]--[phone] or [facility] neurology [address] [place], [place] [address] [phone] 6) No driving, operating heavy machinery, perform activities at heights, swimming or participation in water activities until release by outpatient physician.  This has been discussed with patient.     Will follow   Assessment and plan discussed with with attending physician and they are in agreement.    Felicie Morn PA-C [facility] [phone]  02/14/2013, 10:07 AM

## 2013-02-14 NOTE — Progress Notes (Signed)
Nutrition Brief Note  Patient identified on the Malnutrition Screening Tool (MST) Report  Wt Readings from Last 15 Encounters:  02/14/13 161 lb 2.5 oz (73.1 kg)  01/19/13 184 lb (83.462 kg)  01/10/13 201 lb 14.4 oz (91.581 kg)  01/03/13 198 lb (89.812 kg)  12/28/12 199 lb (90.266 kg)  12/28/12 199 lb (90.266 kg)  05/18/12 181 lb 14.1 oz (82.5 kg)  02/26/11 70 lb 8 oz (31.979 kg)  02/10/11 161 lb 11.2 oz (73.347 kg)  05/13/10 163 lb (73.936 kg)  05/13/09 160 lb (72.576 kg)  07/03/08 159 lb (72.122 kg)  05/14/08 162 lb (73.483 kg)    Body mass index is 21.85 kg/(m^2). Patient meets criteria for WNL based on current BMI.   Current diet order is CHO Mod Medium. Labs and medications reviewed.   Pt usual wt is 160 lbs.  Current wt is consistent with usual wt.  Has resumed PO diet.  No nutrition interventions warranted at this time. If nutrition issues arise, please consult RD.   Loyce Dys, MS RD LDN Clinical Inpatient Dietitian Pager: [phone] Weekend/After hours pager: [phone]

## 2013-02-15 DIAGNOSIS — I6350 Cerebral infarction due to unspecified occlusion or stenosis of unspecified cerebral artery: Secondary | ICD-10-CM

## 2013-02-15 LAB — GLUCOSE, CAPILLARY
GLUCOSE-CAPILLARY: 70 mg/dL (ref 70–99)
Glucose-Capillary: 156 mg/dL — ABNORMAL HIGH (ref 70–99)

## 2013-02-15 LAB — ALBUMIN: Albumin: 2.5 g/dL — ABNORMAL LOW (ref 3.5–5.2)

## 2013-02-15 LAB — PHENYTOIN LEVEL, TOTAL: Phenytoin Lvl: 13.1 ug/mL (ref 10.0–20.0)

## 2013-02-15 MED ORDER — LEVETIRACETAM 500 MG PO TABS: 500.0000 mg | ORAL_TABLET | Freq: Two times a day (BID) | ORAL | Status: AC

## 2013-02-15 MED ORDER — LEVETIRACETAM 500 MG PO TABS: 500.0000 mg | ORAL_TABLET | Freq: Two times a day (BID) | ORAL | Status: DC

## 2013-02-15 NOTE — Progress Notes (Signed)
02/15/2013 12:48 PM Nursing note Discharge avs form, medications already taken today and those due this evening given and explained to patient and family member. Home health PT/RN arrangements reviewed. Pt. States he has a rolling walker already at home to use. D/c iv line in RAC. Pt. States IV in left hand was removed yesterday 12/31 due to site leaking. No iv found in this location. D/c tele. D/c home with family per orders.  Zygmund Passero, Arville Lime

## 2013-02-15 NOTE — Discharge Summary (Signed)
Physician Discharge Summary  [name] [id number] DOB: 1928/11/12 DOA: 02/13/2013  PCP: Woody Seller, MD  Admit date: 02/13/2013 Discharge date: 02/15/2013  Time spent: 35 minutes Recommendations for Outpatient Follow-up:  Follow up with PCP in 1-2 weeks Follow up with Neurology  Discharge Diagnoses:  Principal Problem:   Dilantin toxicity Active Problems:   CORONARY ATHEROSCLEROSIS NATIVE CORONARY ARTERY   SEIZURE DISORDER   ATAXIA   Anemia   Hyponatremia   Hyperkalemia   Diabetes mellitus type 2 in nonobese   Dysarthria   HTN (hypertension)   Dehydration   History of stroke   Discharge Condition: Improved  Diet recommendation: Diabetic  Filed Weights   02/13/13 1248 02/14/13 0434 02/15/13 0501  Weight: 72.576 kg (160 lb) 73.1 kg (161 lb 2.5 oz) 80.151 kg (176 lb 11.2 oz)    History of present illness:  [name] is a 80 y.o. male with a history of a recent right brain stroke in November 2014, seizure disorder, and status post right hip hemiarthroplasty in November 2014, who presents from a local rehabilitation center, [place] with her report of slurred speech and ataxia at the nursing facility. The patient does not recall any of his symptoms at the nursing facility. However, per history gathered, the patient had been walking in feeding himself and taking part in all of his activities, but at approximately 8:30 AM this morning, the occupational therapist noticed that the patient's speech became dysarthric. He was also noted to have nystagmus and ataxia. He was evaluated by the skilled nursing facility physician. He was transferred to the [facility] ED as a code stroke patient. The evaluation in the emergency department yielded no new stroke on the MRI/MRA of the brain. Neurology evaluated the patient. His Dilantin level was calculated based on his albumin and it was found to be in the toxic range. His carbamazepine level was apparently within normal  limits. His urine drug screen was negative. His alcohol level was negative. His urinalysis was negative. His troponin I was within normal limits. His initial lab data revealed a sodium of 134, potassium of 5.5, and glucose of 239. Labs were repeated and they revealed a sodium of 133 and a potassium of 5.2, hemoglobin of 11.2, and glucose of 247. He is being admitted for further evaluation and management.  Hospital Course:   1.   Ataxia and dysarthria, likely secondary to Dilantin toxicity. He had no lower ataxic or dysarthric, but he did have mild nystagmus on exam. Otherwise, his exam was nonfocal on admit. See below 1. Dilantin toxicity. Neurology had evaluated the patient in the ED for a code stroke, but his head MRI revealed no new acute strokes. The neurology team calculated his corrected Dilantin level as 27.4, clearly in the toxic range. Neurology recs to stop dilantin and instead, continue tegretol and add keppra 2. Long-standing seizure disorder. As above, we will stop oral Dilantin but will continue Tegretol and add keppra per Neurology recs. 3. Hyperkalemia. The patient has had a history of hyperkalemia. With IV fluids, his followup serum potassium is high normal at 5.2. 4. Hyponatremia, possibly secondary to dehydration. The patient's BUN/creatinine ratio is indicative of dehydration/volume depletion. Continued IV hydration started in the ED with resolution of hyponatremia. 5. Recent history of acute right brain stroke in November. We will continue Plavix. 6. CAD/hypertension. His troponin I is negative x2. He denies chest pain or anginal equivalent. We will continue Plavix and Coreg. 7. Diabetes mellitus. We'll continue chronic  therapy. Hemoglobin A1c in November 2014 was 7.0. 8. Chronic anemia. Anemia panel in November 2014 revealed vitamin B12 of 1392, folate of 19.6, total iron of 52, TIBC of 186, and ferritin of 154.  Consultations:  Neurology  Discharge Exam: Filed Vitals:    02/14/13 1417 02/14/13 1741 02/14/13 2150 02/15/13 0501  BP: 102/45 146/53 124/50 131/60  Pulse: 75 67 64 65  Temp: 98.1 F (36.7 C)  98.7 F (37.1 C) 98.1 F (36.7 C)  TempSrc: Oral  Oral Oral  Resp: 18  18 20   Height:      Weight:    80.151 kg (176 lb 11.2 oz)  SpO2: 100%  99% 100%    General: Awake, in nad Cardiovascular: regular, s1, s2 Respiratory: normal resp effort, no wheezing  Discharge Instructions     Medication List    STOP taking these medications       phenytoin 100 MG ER capsule  Commonly known as:  DILANTIN      TAKE these medications       carbamazepine 200 MG tablet  Commonly known as:  TEGRETOL  Take 400 mg by mouth at bedtime.     carvedilol 25 MG tablet  Commonly known as:  COREG  Take 25 mg by mouth 2 (two) times daily with a meal.     clopidogrel 75 MG tablet  Commonly known as:  PLAVIX  Take 1 tablet (75 mg total) by mouth daily with breakfast.     Exenatide ER 2 MG Pen  Inject 2 mg into the skin once a week. On Monday     HYDROcodone-acetaminophen 5-325 MG per tablet  Commonly known as:  NORCO/VICODIN  Take 2 tablets by mouth every 4 (four) hours as needed for moderate pain.     insulin glargine 100 units/mL Soln  Commonly known as:  LANTUS  Inject 25 Units into the skin at bedtime.     levETIRAcetam 500 MG tablet  Commonly known as:  KEPPRA  Take 1 tablet (500 mg total) by mouth 2 (two) times daily.     mirtazapine 15 MG tablet  Commonly known as:  REMERON  Take 1 tablet (15 mg total) by mouth at bedtime.     pantoprazole 40 MG tablet  Commonly known as:  PROTONIX  Take 40 mg by mouth daily.     polyethylene glycol packet  Commonly known as:  MIRALAX / GLYCOLAX  Take 17 g by mouth daily.     sertraline 50 MG tablet  Commonly known as:  ZOLOFT  Take 100 mg by mouth daily.     simvastatin 40 MG tablet  Commonly known as:  ZOCOR  Take 40 mg by mouth at bedtime.     tamsulosin 0.4 MG Caps capsule  Commonly known as:   FLOMAX  Take 0.4 mg by mouth at bedtime.     tuberculin 5 UNIT/0.1ML injection  Inject 5 Units into the skin See admin instructions. Every Sun and Friday for 3 days for first step PPD       No Known Allergies Follow-up Information   Follow up with Woody Seller, MD. Schedule an appointment as soon as possible for a visit in 1 week.   Specialty:  Family Medicine   Contact information:   [address] [place] [address] [phone]        The results of significant diagnostics from this hospitalization (including imaging, microbiology, ancillary and laboratory) are listed below for reference.  Significant Diagnostic Studies: Ct Head Wo Contrast  02/13/2013   CLINICAL DATA:  Sudden onset slurred speech. Code stroke. Question history of seizure  EXAM: CT HEAD WITHOUT CONTRAST  TECHNIQUE: Contiguous axial images were obtained from the base of the skull through the vertex without intravenous contrast.  COMPARISON:  01/06/2013  FINDINGS: Skull and Sinuses:No significant abnormality.  Orbits: Bilateral cataract resection.  Brain: No evidence of acute abnormality, such as acute infarction, hemorrhage, hydrocephalus, or mass lesion/mass effect. Unchanged appearance of remote small vessel injury in the right putamen and neighboring internal capsule. Cerebral volume loss which is age appropriate.  These results were called by telephone at the time of interpretation on 02/13/2013 at 12:51 PM to Dr. Leonel Ramsay, who verbally acknowledged these results.  IMPRESSION: No evidence of acute hemorrhage or large territory ischemia.   Electronically Signed   By: Jorje Guild M.D.   On: 02/13/2013 12:55   Mr Virgel Paling Wo Contrast  02/13/2013   CLINICAL DATA:  80 year old male with slurred speech and ataxia. Initial encounter. Stroke versus Dilantin toxicity. Code stroke.  EXAM: MRI HEAD WITHOUT CONTRAST  MRA HEAD WITHOUT CONTRAST  TECHNIQUE: Multiplanar, multiecho pulse sequences of the brain  and surrounding structures were obtained without intravenous contrast. Angiographic images of the head were obtained using MRA technique without contrast.  COMPARISON:  Head CT without contrast 1236 hr the same day. Brain MRI and MRA 01/10/2013 and earlier.  FINDINGS: MRI HEAD FINDINGS  There is a small crescentic area of increased trace diffusion near the right caudate and anterior limb of the external capsule. This corresponds to the mild T2 and FLAIR hyperintensity seen in November. No restricted diffusion, and there is Stable decreased T1 signal here. No restricted diffusion or evidence of acute infarction.  Major intracranial vascular flow voids are stable. No ventriculomegaly. No midline shift, mass effect, or evidence of intracranial mass lesion. No acute intracranial hemorrhage identified. Intermittent mild motion artifact on today's study. Stable gray and white matter signal throughout the brain. Negative pituitary, cervicomedullary junction, visualized cervical spine common bone marrow signal.  Stable orbits soft tissues. Stable paranasal sinuses and mastoids. Visualized scalp soft tissues are within normal limits.  MRA HEAD FINDINGS  Today's images are intermittently degraded by motion.  Stable distal vertebral arteries. Patent PICA origins. Stable vertebrobasilar junction. Patent basilar artery with atherosclerotic irregularity and up to mild stenosis. Stable SCA origins. Fetal type bilateral PCA origins again noted. "Duplicated" left PCA again evident. Distal PCA branches today are somewhat obscured by motion.  Stable antegrade flow in both ICA siphons, detail mildly obscured by motion artifact today. Ophthalmic and posterior communicating artery origins appear stable. Carotid termini, MCA and ACA origins are stable and within normal limits. Visualized bilateral ACA and MCA branches are stable.  IMPRESSION: 1.  No acute intracranial abnormality. 2. Late subacute to early chronic lacunar infarct near the  right caudate and external capsule. 3. Stable intracranial MRA with atherosclerosis, but no proximal stenosis or major circle of Willis branch occlusion. Study reviewed in person with Dr. Leonel Ramsay on 02/13/2013 at 1350 hrs.   Electronically Signed   By: Lars Pinks M.D.   On: 02/13/2013 14:43   Mr Brain Wo Contrast  02/13/2013   CLINICAL DATA:  80 year old male with slurred speech and ataxia. Initial encounter. Stroke versus Dilantin toxicity. Code stroke.  EXAM: MRI HEAD WITHOUT CONTRAST  MRA HEAD WITHOUT CONTRAST  TECHNIQUE: Multiplanar, multiecho pulse sequences of the brain and surrounding structures were obtained without intravenous contrast.  Angiographic images of the head were obtained using MRA technique without contrast.  COMPARISON:  Head CT without contrast 1236 hr the same day. Brain MRI and MRA 01/10/2013 and earlier.  FINDINGS: MRI HEAD FINDINGS  There is a small crescentic area of increased trace diffusion near the right caudate and anterior limb of the external capsule. This corresponds to the mild T2 and FLAIR hyperintensity seen in November. No restricted diffusion, and there is Stable decreased T1 signal here. No restricted diffusion or evidence of acute infarction.  Major intracranial vascular flow voids are stable. No ventriculomegaly. No midline shift, mass effect, or evidence of intracranial mass lesion. No acute intracranial hemorrhage identified. Intermittent mild motion artifact on today's study. Stable gray and white matter signal throughout the brain. Negative pituitary, cervicomedullary junction, visualized cervical spine common bone marrow signal.  Stable orbits soft tissues. Stable paranasal sinuses and mastoids. Visualized scalp soft tissues are within normal limits.  MRA HEAD FINDINGS  Today's images are intermittently degraded by motion.  Stable distal vertebral arteries. Patent PICA origins. Stable vertebrobasilar junction. Patent basilar artery with atherosclerotic  irregularity and up to mild stenosis. Stable SCA origins. Fetal type bilateral PCA origins again noted. "Duplicated" left PCA again evident. Distal PCA branches today are somewhat obscured by motion.  Stable antegrade flow in both ICA siphons, detail mildly obscured by motion artifact today. Ophthalmic and posterior communicating artery origins appear stable. Carotid termini, MCA and ACA origins are stable and within normal limits. Visualized bilateral ACA and MCA branches are stable.  IMPRESSION: 1.  No acute intracranial abnormality. 2. Late subacute to early chronic lacunar infarct near the right caudate and external capsule. 3. Stable intracranial MRA with atherosclerosis, but no proximal stenosis or major circle of Willis branch occlusion. Study reviewed in person with Dr. Leonel Ramsay on 02/13/2013 at 1350 hrs.   Electronically Signed   By: Lars Pinks M.D.   On: 02/13/2013 14:43    Microbiology: Recent Results (from the past 240 hour(s))  MRSA PCR SCREENING     Status: None   Collection Time    02/14/13  5:52 AM      Result Value Range Status   MRSA by PCR NEGATIVE  NEGATIVE Final   Comment:            The GeneXpert MRSA Assay (FDA     approved for NASAL specimens     only), is one component of a     comprehensive MRSA colonization     surveillance program. It is not     intended to diagnose MRSA     infection nor to guide or     monitor treatment for     MRSA infections.     Labs: Basic Metabolic Panel:  Recent Labs Lab 02/13/13 1235 02/13/13 1255 02/14/13 0529  NA 134* 133* 139  K 5.5* 5.2 5.5*  CL 97 101 103  CO2 22  --  26  GLUCOSE 239* 247* 70  BUN 30* 33* 23  CREATININE 1.21 1.30 1.04  CALCIUM 8.6  --  8.5   Liver Function Tests:  Recent Labs Lab 02/13/13 1235 02/15/13 0605  AST 17  --   ALT 17  --   ALKPHOS 135*  --   BILITOT 0.2*  --   PROT 7.1  --   ALBUMIN 3.1* 2.5*   No results found for this basename: LIPASE, AMYLASE,  in the last 168 hours No  results found for this basename: AMMONIA,  in the last  168 hours CBC:  Recent Labs Lab 02/13/13 1235 02/13/13 1255 02/14/13 0529  WBC 10.5  --  6.9  NEUTROABS 6.5  --   --   HGB 10.9* 11.2* 9.6*  HCT 33.2* 33.0* 29.6*  MCV 95.4  --  94.9  PLT 312  --  282   Cardiac Enzymes:  Recent Labs Lab 02/13/13 1235  TROPONINI <0.30   BNP: BNP (last 3 results) No results found for this basename: PROBNP,  in the last 8760 hours CBG:  Recent Labs Lab 02/14/13 0646 02/14/13 1116 02/14/13 1616 02/14/13 2148 02/15/13 0608  GLUCAP 77 227* 147* 111* 70    Signed:  Nolon Yellin K  [facility] 02/15/2013, 10:17 AM

## 2013-02-15 NOTE — Progress Notes (Addendum)
   CARE MANAGEMENT NOTE 02/15/2013  Patient:  [name], [name]   Account Number:  [id number]  Date Initiated:  02/15/2013  Documentation initiated by:  [facility]  Subjective/Objective Assessment:   adm: slurred speech and ataxia     Action/Plan:   discharge planning   Anticipated DC Date:  02/15/2013   Anticipated DC Plan:  [place]  CM consult      [facility] Choice  HOME HEALTH   Choice offered to / List presented to:  C-1 Patient        [place] arranged  HH-2 PT     hh aide [place].   Status of service:  Completed, signed off Medicare Important Message given?   (If response is "NO", the following Medicare IM given date fields will be blank) Date Medicare IM given:   Date Additional Medicare IM given:    Discharge Disposition:  [place]  Per UR Regulation:    If discussed at Long Length of Stay Meetings, dates discussed:    Comments:  02/15/13 10:00 CM spoke with pt in room to offer choice for HHPT/aide.  Pt states he would love to have "Marzetta Board" from [facility] as she took care of his wife ( who has since passed).  Address and contact numbers were verified.  Pt states he has all the equipment he needs from his wife's therapy needs. Referral faxed to [facility] and request for "Marzetta Board" was made.  Pt understands [facility] may not be able to accomodate but the message would be passed on.  No other CM neds were communicated.  Mariane Masters, BSN, CM [phone].

## 2013-02-15 NOTE — Progress Notes (Signed)
Physical Therapy Treatment Patient Details Name: [name] MRN: [id number] DOB: 1928/06/30 Today's Date: 02/15/2013 Time: 0998-3382 PT Time Calculation (min): 18 min  PT Assessment / Plan / Recommendation  History of Present Illness     PT Comments   Patient doing well. Planning to go home today. Safe with use of RW.   Follow Up Recommendations  Home health PT;Supervision - Intermittent     Does the patient have the potential to tolerate intense rehabilitation     Barriers to Discharge        Equipment Recommendations       Recommendations for Other Services    Frequency Min 3X/week   Progress towards PT Goals Progress towards PT goals: Progressing toward goals  Plan Current plan remains appropriate    Precautions / Restrictions     Pertinent Vitals/Pain Denied pain    Mobility  Transfers Sit to Stand: With upper extremity assist;From chair/3-in-1;6: Modified independent (Device/Increase time) Stand to Sit: 6: Modified independent (Device/Increase time);With upper extremity assist;To chair/3-in-1 Ambulation/Gait Ambulation/Gait Assistance: 6: Modified independent (Device/Increase time) Ambulation Distance (Feet): 400 Feet Assistive device: Rolling walker Gait Pattern: Step-through pattern    Exercises     PT Diagnosis:    PT Problem List:   PT Treatment Interventions:     PT Goals (current goals can now be found in the care plan section)    Visit Information       Subjective Data      Cognition  Cognition Arousal/Alertness: Awake/alert Behavior During Therapy: WFL for tasks assessed/performed Overall Cognitive Status: Within Functional Limits for tasks assessed    Balance     End of Session PT - End of Session Activity Tolerance: Patient tolerated treatment well Patient left: in chair;with call bell/phone within reach;with family/visitor present Nurse Communication: Mobility status   GP     Jacqualyn Posey 02/15/2013, 12:21  PM  02/15/2013 Jacqualyn Posey PTA [phone] pager [phone] office

## 2013-02-15 NOTE — Discharge Instructions (Signed)
No driving, operating heavy machinery, perform activities at heights, swimming or participation in water activities until release by outpatient physician.  He will nee neurology follow up as out patient on discharge. [facility], [place], [place] [address]--[phone] or [facility] neurology [address] [place], [place] [address] [phone]

## 2014-02-18 ENCOUNTER — Encounter (HOSPITAL_COMMUNITY): Payer: Self-pay | Admitting: *Deleted

## 2014-02-18 ENCOUNTER — Emergency Department (HOSPITAL_COMMUNITY): Payer: Medicare HMO

## 2014-02-18 ENCOUNTER — Emergency Department (HOSPITAL_COMMUNITY)
Admission: EM | Admit: 2014-02-18 | Discharge: 2014-02-18 | Disposition: A | Discharge status: Home / Self Care | Payer: Medicare HMO | Attending: Emergency Medicine | Admitting: Emergency Medicine

## 2014-02-18 DIAGNOSIS — Z8701 Personal history of pneumonia (recurrent): Secondary | ICD-10-CM | POA: Diagnosis not present

## 2014-02-18 DIAGNOSIS — K219 Gastro-esophageal reflux disease without esophagitis: Secondary | ICD-10-CM | POA: Diagnosis not present

## 2014-02-18 DIAGNOSIS — I252 Old myocardial infarction: Secondary | ICD-10-CM | POA: Diagnosis not present

## 2014-02-18 DIAGNOSIS — M47819 Spondylosis without myelopathy or radiculopathy, site unspecified: Secondary | ICD-10-CM

## 2014-02-18 DIAGNOSIS — E860 Dehydration: Secondary | ICD-10-CM | POA: Diagnosis not present

## 2014-02-18 DIAGNOSIS — Z951 Presence of aortocoronary bypass graft: Secondary | ICD-10-CM | POA: Insufficient documentation

## 2014-02-18 DIAGNOSIS — F419 Anxiety disorder, unspecified: Secondary | ICD-10-CM | POA: Insufficient documentation

## 2014-02-18 DIAGNOSIS — M4306 Spondylolysis, lumbar region: Secondary | ICD-10-CM | POA: Diagnosis not present

## 2014-02-18 DIAGNOSIS — Z8673 Personal history of transient ischemic attack (TIA), and cerebral infarction without residual deficits: Secondary | ICD-10-CM | POA: Diagnosis not present

## 2014-02-18 DIAGNOSIS — I25119 Atherosclerotic heart disease of native coronary artery with unspecified angina pectoris: Secondary | ICD-10-CM | POA: Insufficient documentation

## 2014-02-18 DIAGNOSIS — I129 Hypertensive chronic kidney disease with stage 1 through stage 4 chronic kidney disease, or unspecified chronic kidney disease: Secondary | ICD-10-CM | POA: Diagnosis not present

## 2014-02-18 DIAGNOSIS — E785 Hyperlipidemia, unspecified: Secondary | ICD-10-CM | POA: Insufficient documentation

## 2014-02-18 DIAGNOSIS — Z87891 Personal history of nicotine dependence: Secondary | ICD-10-CM | POA: Insufficient documentation

## 2014-02-18 DIAGNOSIS — G40909 Epilepsy, unspecified, not intractable, without status epilepticus: Secondary | ICD-10-CM | POA: Diagnosis not present

## 2014-02-18 DIAGNOSIS — N189 Chronic kidney disease, unspecified: Secondary | ICD-10-CM | POA: Diagnosis not present

## 2014-02-18 DIAGNOSIS — E119 Type 2 diabetes mellitus without complications: Secondary | ICD-10-CM | POA: Diagnosis not present

## 2014-02-18 DIAGNOSIS — Z791 Long term (current) use of non-steroidal anti-inflammatories (NSAID): Secondary | ICD-10-CM | POA: Diagnosis not present

## 2014-02-18 DIAGNOSIS — Z794 Long term (current) use of insulin: Secondary | ICD-10-CM | POA: Insufficient documentation

## 2014-02-18 DIAGNOSIS — Z7982 Long term (current) use of aspirin: Secondary | ICD-10-CM | POA: Diagnosis not present

## 2014-02-18 DIAGNOSIS — R531 Weakness: Secondary | ICD-10-CM

## 2014-02-18 DIAGNOSIS — M545 Low back pain: Secondary | ICD-10-CM | POA: Diagnosis present

## 2014-02-18 DIAGNOSIS — G8929 Other chronic pain: Secondary | ICD-10-CM | POA: Insufficient documentation

## 2014-02-18 DIAGNOSIS — Z7902 Long term (current) use of antithrombotics/antiplatelets: Secondary | ICD-10-CM | POA: Insufficient documentation

## 2014-02-18 DIAGNOSIS — Z8781 Personal history of (healed) traumatic fracture: Secondary | ICD-10-CM | POA: Insufficient documentation

## 2014-02-18 LAB — URINALYSIS, ROUTINE W REFLEX MICROSCOPIC
Glucose, UA: NEGATIVE mg/dL
Hgb urine dipstick: NEGATIVE
Ketones, ur: NEGATIVE mg/dL
LEUKOCYTES UA: NEGATIVE
Nitrite: NEGATIVE
Protein, ur: NEGATIVE mg/dL
Specific Gravity, Urine: 1.024 (ref 1.005–1.030)
Urobilinogen, UA: 0.2 mg/dL (ref 0.0–1.0)
pH: 5 (ref 5.0–8.0)

## 2014-02-18 LAB — CBC WITH DIFFERENTIAL/PLATELET
BASOS ABS: 0 10*3/uL (ref 0.0–0.1)
BASOS PCT: 0 % (ref 0–1)
EOS PCT: 7 % — AB (ref 0–5)
Eosinophils Absolute: 1.1 10*3/uL — ABNORMAL HIGH (ref 0.0–0.7)
HCT: 31.5 % — ABNORMAL LOW (ref 39.0–52.0)
Hemoglobin: 10.2 g/dL — ABNORMAL LOW (ref 13.0–17.0)
Lymphocytes Relative: 19 % (ref 12–46)
Lymphs Abs: 3 10*3/uL (ref 0.7–4.0)
MCH: 29.3 pg (ref 26.0–34.0)
MCHC: 32.4 g/dL (ref 30.0–36.0)
MCV: 90.5 fL (ref 78.0–100.0)
Monocytes Absolute: 1.6 10*3/uL — ABNORMAL HIGH (ref 0.1–1.0)
Monocytes Relative: 10 % (ref 3–12)
NEUTROS ABS: 10.2 10*3/uL — AB (ref 1.7–7.7)
Neutrophils Relative %: 64 % (ref 43–77)
Platelets: 376 10*3/uL (ref 150–400)
RBC: 3.48 MIL/uL — ABNORMAL LOW (ref 4.22–5.81)
RDW: 13.4 % (ref 11.5–15.5)
WBC: 15.9 10*3/uL — ABNORMAL HIGH (ref 4.0–10.5)

## 2014-02-18 LAB — BASIC METABOLIC PANEL
Anion gap: 14 (ref 5–15)
BUN: 31 mg/dL — AB (ref 6–23)
CHLORIDE: 104 meq/L (ref 96–112)
CO2: 17 mmol/L — ABNORMAL LOW (ref 19–32)
Calcium: 9.1 mg/dL (ref 8.4–10.5)
Creatinine, Ser: 1.48 mg/dL — ABNORMAL HIGH (ref 0.50–1.35)
GFR calc non Af Amer: 42 mL/min — ABNORMAL LOW (ref >90)
GFR, EST AFRICAN AMERICAN: 49 mL/min — AB (ref >90)
Glucose, Bld: 213 mg/dL — ABNORMAL HIGH (ref 70–99)
POTASSIUM: 5.1 mmol/L (ref 3.5–5.1)
Sodium: 135 mmol/L (ref 135–145)

## 2014-02-18 LAB — I-STAT TROPONIN, ED: Troponin i, poc: 0 ng/mL (ref 0.00–0.08)

## 2014-02-18 LAB — I-STAT CG4 LACTIC ACID, ED
LACTIC ACID, VENOUS: 2.6 mmol/L — AB (ref 0.5–2.2)
Lactic Acid, Venous: 1.67 mmol/L (ref 0.5–2.2)

## 2014-02-18 MED ORDER — OXYCODONE-ACETAMINOPHEN 5-325 MG PO TABS: 1.0000 | ORAL_TABLET | ORAL | Status: DC | PRN

## 2014-02-18 MED ORDER — MORPHINE SULFATE 4 MG/ML IJ SOLN
4.0000 mg | Freq: Once | INTRAMUSCULAR | Status: AC
  Administered 2014-02-18: 4 mg via INTRAVENOUS
  Filled 2014-02-18: qty 1

## 2014-02-18 MED ORDER — SODIUM CHLORIDE 0.9 % IV BOLUS (SEPSIS)
1000.0000 mL | Freq: Once | INTRAVENOUS | Status: AC
  Administered 2014-02-18: 1000 mL via INTRAVENOUS

## 2014-02-18 MED ORDER — ONDANSETRON HCL 4 MG/2ML IJ SOLN
4.0000 mg | Freq: Once | INTRAMUSCULAR | Status: AC
  Administered 2014-02-18: 4 mg via INTRAVENOUS
  Filled 2014-02-18: qty 2

## 2014-02-18 MED ORDER — OXYCODONE-ACETAMINOPHEN 5-325 MG PO TABS
1.0000 | ORAL_TABLET | Freq: Once | ORAL | Status: AC
  Administered 2014-02-18: 1 via ORAL
  Filled 2014-02-18: qty 1

## 2014-02-18 MED ORDER — SODIUM CHLORIDE 0.9 % IV BOLUS (SEPSIS)
500.0000 mL | Freq: Once | INTRAVENOUS | Status: AC
  Administered 2014-02-18: 500 mL via INTRAVENOUS

## 2014-02-18 NOTE — ED Provider Notes (Signed)
CSN: [id number]     Arrival date & time 02/18/14  1413 History   First MD Initiated Contact with Patient 02/18/14 1510     Chief Complaint  Patient presents with  . Weakness    Patient is a 81 y.o. male presenting with back pain. The history is provided by the patient and a relative.  Back Pain Location:  Lumbar spine Quality:  Aching Radiates to:  Does not radiate Pain severity:  Moderate Pain is:  Same all the time Onset quality:  Gradual Duration:  1 month Timing:  Intermittent Progression:  Worsening Chronicity:  Recurrent Relieved by: rest. Worsened by:  Movement Associated symptoms: no abdominal pain, no chest pain, no dysuria, no fever, no headaches and no weakness   Patient reports low back pain for over a month Denies recent fall It is reported he was seen back in November for this pain and had "back xray" and "hip xray" and was told they showed arthritis.  He reports that his pain is limiting his movement at home and he has not left house since Christmas  Denies cp.  Reports chronic SOB with movement.  No HA.  No abd pain.  No focal weakness in his extremities.  He reports chronic numbness to his feet that is unchanged.  Past Medical History  Diagnosis Date  . CAD (coronary artery disease)     s/p cabg 3/10...s/p MI age 56  . Diabetes mellitus type II   . HTN (hypertension)   . HLD (hyperlipidemia)   . Anxiety disorder   . Tremor   . GERD (gastroesophageal reflux disease)   . Pulmonary vein stenosis   . Angina   . Chronic kidney disease   . Anxiety   . Myocardial infarction 1970's; 2010  . Aspiration pneumonia     History of aspiration pneumonia with vent-dependent respiratory/notes 06/12/2008 (12/26/2012)  . Orthopnoea     "just recently" (12/26/2012)  . History of stomach ulcers     "long time ago" (12/26/2012)  . Seizures     "I've had them all my life; last one was 04/2008" (12/26/2012)  . Chronic lower back pain   . Depression     "used to have  problems w/depression" (12/26/2012)  . Fall at home 12/25/2012    "stumbled over cord while I was blowing leaves; broke my right hip" (12/26/2012)  . CVA (cerebral infarction) 01/07/2013    Acute right brain stroke  . Fracture of femoral neck, right 01/03/2013   Past Surgical History  Procedure Laterality Date  . Coronary artery bypass graft  04/2008    "CABG X3" (12/26/2012)  . Cataract extraction w/ intraocular lens  implant, bilateral Bilateral ~2004  . Hip arthroplasty Right 12/26/2012    Procedure: Right Hip Hemiarthroplasty;  Surgeon: [name], MD;  Location: [facility];  Service: Orthopedics;  Laterality: Right;  . Inguinal hernia repair Right    Family History  Problem Relation Age of Onset  . Coronary artery disease    . Hypertension     History  Substance Use Topics  . Smoking status: Former Research scientist (life sciences)  . Smokeless tobacco: Former User    Types: Chew     Comment: 02/13/2013 "smoked a little in my teens; quit chewing ~ 2008"  . Alcohol Use: 0.0 oz/week     Comment: 12/30//2014 "used to drink; never had problems w/it; quit > 30 yrs ago"    Review of Systems  Constitutional: Negative for fever.  Cardiovascular: Negative for chest pain.  Gastrointestinal: Negative for vomiting, abdominal pain and diarrhea.  Genitourinary: Negative for dysuria.  Musculoskeletal: Positive for back pain.  Neurological: Negative for weakness and headaches.  All other systems reviewed and are negative.     Allergies  Review of patient's allergies indicates no known allergies.  Home Medications   Prior to Admission medications   Medication Sig Start Date End Date Taking? Authorizing Provider  aspirin 81 MG chewable tablet Chew 81 mg by mouth daily.   Yes Historical Provider, MD  carbamazepine (EPITOL) 200 MG tablet Take 400 mg by mouth at bedtime.   Yes Historical Provider, MD  carvedilol (COREG) 25 MG tablet Take 25 mg by mouth 2 (two) times daily with a meal.    Yes Historical  Provider, MD  clopidogrel (PLAVIX) 75 MG tablet Take 1 tablet (75 mg total) by mouth daily with breakfast. 05/20/12  Yes Belkys A Regalado, MD  Cyanocobalamin (B-12) 1000 MCG CAPS Take 1 capsule by mouth daily.   Yes Historical Provider, MD  Exenatide ER 2 MG PEN Inject 2 mg into the skin once a week. On Monday   Yes Historical Provider, MD  insulin glargine (LANTUS) 100 units/mL SOLN Inject 30 Units into the skin at bedtime.    Yes Historical Provider, MD  levETIRAcetam (KEPPRA) 500 MG tablet Take 1 tablet (500 mg total) by mouth 2 (two) times daily. 02/15/13  Yes Donne Hazel, MD  meloxicam (MOBIC) 7.5 MG tablet Take 7.5 mg by mouth daily.   Yes Historical Provider, MD  mirtazapine (REMERON) 15 MG tablet Take 1 tablet (15 mg total) by mouth at bedtime. 01/10/13  Yes Ripudeep Krystal Eaton, MD  pantoprazole (PROTONIX) 40 MG tablet Take 40 mg by mouth daily.    Yes Historical Provider, MD  simvastatin (ZOCOR) 40 MG tablet Take 40 mg by mouth at bedtime.    Yes Historical Provider, MD  Tamsulosin HCl (FLOMAX) 0.4 MG CAPS Take 0.4 mg by mouth at bedtime.    Yes Historical Provider, MD  traMADol (ULTRAM) 50 MG tablet Take 50 mg by mouth every 6 (six) hours as needed.   Yes Historical Provider, MD  polyethylene glycol (MIRALAX / GLYCOLAX) packet Take 17 g by mouth daily. Patient not taking: Reported on 02/18/2014 12/29/12   Belkys A Regalado, MD  tuberculin 5 UNIT/0.1ML injection Inject 5 Units into the skin See admin instructions. Every Sun and Friday for 3 days for first step PPD    Historical Provider, MD   BP 130/39 mmHg  Pulse 71  Temp(Src) 97.3 F (36.3 C) (Oral)  Resp 16  Ht 5\' 11"  (1.803 m)  Wt 180 lb (81.647 kg)  BMI 25.12 kg/m2  SpO2 99% Physical Exam CONSTITUTIONAL: elderly frail HEAD: Normocephalic/atraumatic EYES: EOMI ENMT: Mucous membranes moist NECK: supple no meningeal signs SPINE/BACK:entire spine nontender, lumbar paraspinal tenderness, No bruising/crepitance/stepoffs noted to  spine CV: S1/S2 noted, no murmurs/rubs/gallops noted LUNGS: Lungs are clear to auscultation bilaterally, no apparent distress ABDOMEN: soft, nontender, no rebound or guarding, bowel sounds noted throughout abdomen GU:no cva tenderness NEURO: Pt is awake/alert/appropriate, moves all extremitiesx4.  No facial droop.  No arm/leg drift.    equal distal motor 5/5 strength noted with the following: hip flexion/knee flexion/extension, ankle dorsi/plantar flexion, great toe extension intact bilaterally. EXTREMITIES: pulses normal/equal, full ROM, All extremities/joints palpated/ranged and nontender SKIN: warm, color normal PSYCH: no abnormalities of mood noted, alert and oriented to situation  ED Course  Procedures  3:50 PM Pt from home with worsening back pain  for over a month Initial BP low but has normalized.  He is well appearing.  Repeat BP improved on mutliple checks Labs/imaging pending Home health has been arranged (physical therapy)  Pt felt improved He had dehydration that resolved with IV fluids He was ambulatory (reported pain but could still ambulate and has had this pain for over a month) I doubt occult fracture He denies new CP/SOB with exertion He feels comfortable for d/c home Pain meds ordered for outpatient management He never any further hypotension, suspect error in initial reading  BP 170/67 mmHg  Pulse 74  Temp(Src) 97.4 F (36.3 C) (Oral)  Resp 28  Ht 5\' 11"  (1.803 m)  Wt 180 lb (81.647 kg)  BMI 25.12 kg/m2  SpO2 98%   Labs Review Labs Reviewed  CBC WITH DIFFERENTIAL - Abnormal; Notable for the following:    WBC 15.9 (*)    RBC 3.48 (*)    Hemoglobin 10.2 (*)    HCT 31.5 (*)    Eosinophils Relative 7 (*)    Neutro Abs 10.2 (*)    Monocytes Absolute 1.6 (*)    Eosinophils Absolute 1.1 (*)    All other components within normal limits  BASIC METABOLIC PANEL - Abnormal; Notable for the following:    CO2 17 (*)    Glucose, Bld 213 (*)    BUN 31 (*)     Creatinine, Ser 1.48 (*)    GFR calc non Af Amer 42 (*)    GFR calc Af Amer 49 (*)    All other components within normal limits  URINALYSIS, ROUTINE W REFLEX MICROSCOPIC - Abnormal; Notable for the following:    Bilirubin Urine SMALL (*)    All other components within normal limits  I-STAT CG4 LACTIC ACID, ED - Abnormal; Notable for the following:    Lactic Acid, Venous 2.60 (*)    All other components within normal limits  I-STAT TROPOININ, ED  I-STAT CG4 LACTIC ACID, ED    Imaging Review Dg Chest 2 View  02/18/2014   CLINICAL DATA:  Weakness and shortness of breath for 1 day.  EXAM: CHEST  2 VIEW  COMPARISON:  08/29/2013.  FINDINGS: The cardiac silhouette remains borderline enlarged. Stable post CABG changes. Decreased inspiration with increased prominence of the interstitial markings. No pleural fluid. Diffuse osteopenia.  IMPRESSION: Increased prominence of the interstitial markings, most likely due to crowding due to a decreased inspiration. Mild interstitial pulmonary edema cannot be excluded.   Electronically Signed   By: Enrique Sack M.D.   On: 02/18/2014 16:25   Dg Lumbar Spine Complete  02/18/2014   CLINICAL DATA:  Low back pain for 1 month with no known injury, initial evaluation  EXAM: LUMBAR SPINE - COMPLETE 4+ VIEW  COMPARISON:  01/28/2014  FINDINGS: Normal alignment with no fracture. Degenerative disc disease throughout the lumbar spine. Mild calcification of the abdominal aorta.  IMPRESSION: Mild degenerative changes with no acute findings. No significant change from prior study.   Electronically Signed   By: Skipper Cliche M.D.   On: 02/18/2014 16:23     EKG Interpretation   Date/Time:  Monday February 18 2014 14:40:33 EST Ventricular Rate:  70 PR Interval:  236 QRS Duration: 109 QT Interval:  413 QTC Calculation: 446 R Axis:   3 Text Interpretation:  Sinus rhythm Prolonged PR interval Incomplete left  bundle branch block Confirmed by DELOS  MD, DOUGLAS ([id number]) on  02/18/2014  3:03:54 PM     Medications  morphine  4 MG/ML injection 4 mg (4 mg Intravenous Given 02/18/14 1623)  ondansetron (ZOFRAN) injection 4 mg (4 mg Intravenous Given 02/18/14 1623)  sodium chloride 0.9 % bolus 1,000 mL (0 mLs Intravenous Stopped 02/18/14 1752)  sodium chloride 0.9 % bolus 500 mL (0 mLs Intravenous Stopped 02/18/14 1752)  oxyCODONE-acetaminophen (PERCOCET/ROXICET) 5-325 MG per tablet 1 tablet (1 tablet Oral Given 02/18/14 1941)    MDM   Final diagnoses:  Weakness  Dehydration  Low back pain, unspecified back pain laterality, with sciatica presence unspecified  Degenerative spinal arthritis    Nursing notes including past medical history and social history reviewed and considered in documentation xrays/imaging reviewed by myself and considered during evaluation. Labs/vital reviewed myself and considered during evaluation Previous records reviewed and considered     Sharyon Cable, MD 02/18/14 2000

## 2014-02-18 NOTE — ED Notes (Signed)
Pt diaphoretic and reports sob while moving from wheelchair to bed. Also having numbness to legs.

## 2014-02-18 NOTE — Discharge Instructions (Signed)

## 2014-02-18 NOTE — ED Notes (Signed)
Pt reports having right side lower back pain/hip pain, difficulty ambulating x 1 month. Denies all other symptoms. bp is 72/53 and pt is pale at triage.

## 2014-02-18 NOTE — ED Notes (Addendum)
Dr Christy Gentles requesting case management to set up home health for patient, case management made aware.

## 2014-02-18 NOTE — ED Notes (Signed)
I Stat Lactic Acid results shown to Dr. Penni Bombard

## 2014-02-18 NOTE — ED Notes (Signed)
Social worker at bedside.

## 2014-02-18 NOTE — ED Notes (Addendum)
PT ambulated 50+ feet in hallway on pulse ox. Pt denies light headedness or dizziness. PT had slow limping gait. Pt's pulse ox stayed at 100% throughout ambulation. Pt's pulse stayed between 79-83 bpm throughout ambulation. Pt complained of Right hip and Right knee pain during ambulation.

## 2014-02-18 NOTE — ED Notes (Signed)
Pt attempted to urinate, was unsuccessful. Pt consented to I&O cath.

## 2014-02-18 NOTE — ED Notes (Signed)
Pt reports having severe pain during ambulation in his back.

## 2014-02-18 NOTE — Discharge Planning (Signed)
Quaran Kedzierski J. Clydene Laming, RN, BSN, [employer] [phone] Spoke with pt at bedside regarding discharge planning for [facility]. Offered pt list of home health agencies to choose from.  Pt chose [facility] to render services. Colletta Maryland of [facility] notified.  No DME needs identified at this time.

## 2014-02-25 ENCOUNTER — Emergency Department (HOSPITAL_COMMUNITY): Payer: Commercial Managed Care - HMO

## 2014-02-25 ENCOUNTER — Inpatient Hospital Stay (HOSPITAL_COMMUNITY)
Admission: EM | Admit: 2014-02-25 | Discharge: 2014-03-18 | DRG: 542 | Disposition: E | Payer: Commercial Managed Care - HMO | Attending: Internal Medicine | Admitting: Internal Medicine

## 2014-02-25 ENCOUNTER — Inpatient Hospital Stay (HOSPITAL_COMMUNITY): Payer: Commercial Managed Care - HMO

## 2014-02-25 ENCOUNTER — Encounter (HOSPITAL_COMMUNITY): Payer: Self-pay

## 2014-02-25 DIAGNOSIS — F17220 Nicotine dependence, chewing tobacco, uncomplicated: Secondary | ICD-10-CM | POA: Diagnosis present

## 2014-02-25 DIAGNOSIS — T380X5A Adverse effect of glucocorticoids and synthetic analogues, initial encounter: Secondary | ICD-10-CM | POA: Diagnosis present

## 2014-02-25 DIAGNOSIS — I2510 Atherosclerotic heart disease of native coronary artery without angina pectoris: Secondary | ICD-10-CM | POA: Diagnosis present

## 2014-02-25 DIAGNOSIS — D72829 Elevated white blood cell count, unspecified: Secondary | ICD-10-CM

## 2014-02-25 DIAGNOSIS — C799 Secondary malignant neoplasm of unspecified site: Secondary | ICD-10-CM

## 2014-02-25 DIAGNOSIS — Z452 Encounter for adjustment and management of vascular access device: Secondary | ICD-10-CM

## 2014-02-25 DIAGNOSIS — E785 Hyperlipidemia, unspecified: Secondary | ICD-10-CM | POA: Diagnosis present

## 2014-02-25 DIAGNOSIS — Z951 Presence of aortocoronary bypass graft: Secondary | ICD-10-CM

## 2014-02-25 DIAGNOSIS — M549 Dorsalgia, unspecified: Secondary | ICD-10-CM | POA: Diagnosis present

## 2014-02-25 DIAGNOSIS — M545 Low back pain, unspecified: Secondary | ICD-10-CM

## 2014-02-25 DIAGNOSIS — E1165 Type 2 diabetes mellitus with hyperglycemia: Secondary | ICD-10-CM | POA: Diagnosis present

## 2014-02-25 DIAGNOSIS — F329 Major depressive disorder, single episode, unspecified: Secondary | ICD-10-CM | POA: Diagnosis present

## 2014-02-25 DIAGNOSIS — E860 Dehydration: Secondary | ICD-10-CM | POA: Diagnosis present

## 2014-02-25 DIAGNOSIS — N400 Enlarged prostate without lower urinary tract symptoms: Secondary | ICD-10-CM | POA: Diagnosis present

## 2014-02-25 DIAGNOSIS — C7972 Secondary malignant neoplasm of left adrenal gland: Secondary | ICD-10-CM | POA: Diagnosis present

## 2014-02-25 DIAGNOSIS — K5900 Constipation, unspecified: Secondary | ICD-10-CM

## 2014-02-25 DIAGNOSIS — C7951 Secondary malignant neoplasm of bone: Principal | ICD-10-CM | POA: Diagnosis present

## 2014-02-25 DIAGNOSIS — E875 Hyperkalemia: Secondary | ICD-10-CM | POA: Diagnosis present

## 2014-02-25 DIAGNOSIS — Z794 Long term (current) use of insulin: Secondary | ICD-10-CM

## 2014-02-25 DIAGNOSIS — Z96641 Presence of right artificial hip joint: Secondary | ICD-10-CM | POA: Diagnosis present

## 2014-02-25 DIAGNOSIS — D649 Anemia, unspecified: Secondary | ICD-10-CM

## 2014-02-25 DIAGNOSIS — C801 Malignant (primary) neoplasm, unspecified: Secondary | ICD-10-CM | POA: Diagnosis present

## 2014-02-25 DIAGNOSIS — R062 Wheezing: Secondary | ICD-10-CM

## 2014-02-25 DIAGNOSIS — Z993 Dependence on wheelchair: Secondary | ICD-10-CM

## 2014-02-25 DIAGNOSIS — Z8673 Personal history of transient ischemic attack (TIA), and cerebral infarction without residual deficits: Secondary | ICD-10-CM

## 2014-02-25 DIAGNOSIS — Z515 Encounter for palliative care: Secondary | ICD-10-CM

## 2014-02-25 DIAGNOSIS — E1100 Type 2 diabetes mellitus with hyperosmolarity without nonketotic hyperglycemic-hyperosmolar coma (NKHHC): Secondary | ICD-10-CM | POA: Diagnosis present

## 2014-02-25 DIAGNOSIS — Z7982 Long term (current) use of aspirin: Secondary | ICD-10-CM

## 2014-02-25 DIAGNOSIS — I252 Old myocardial infarction: Secondary | ICD-10-CM | POA: Diagnosis not present

## 2014-02-25 DIAGNOSIS — K219 Gastro-esophageal reflux disease without esophagitis: Secondary | ICD-10-CM | POA: Diagnosis present

## 2014-02-25 DIAGNOSIS — I10 Essential (primary) hypertension: Secondary | ICD-10-CM

## 2014-02-25 DIAGNOSIS — Z79899 Other long term (current) drug therapy: Secondary | ICD-10-CM

## 2014-02-25 DIAGNOSIS — M5441 Lumbago with sciatica, right side: Secondary | ICD-10-CM

## 2014-02-25 DIAGNOSIS — I509 Heart failure, unspecified: Secondary | ICD-10-CM | POA: Diagnosis not present

## 2014-02-25 DIAGNOSIS — R627 Adult failure to thrive: Secondary | ICD-10-CM | POA: Diagnosis present

## 2014-02-25 DIAGNOSIS — N179 Acute kidney failure, unspecified: Secondary | ICD-10-CM | POA: Diagnosis present

## 2014-02-25 DIAGNOSIS — Z66 Do not resuscitate: Secondary | ICD-10-CM | POA: Diagnosis not present

## 2014-02-25 DIAGNOSIS — I129 Hypertensive chronic kidney disease with stage 1 through stage 4 chronic kidney disease, or unspecified chronic kidney disease: Secondary | ICD-10-CM | POA: Diagnosis present

## 2014-02-25 DIAGNOSIS — R609 Edema, unspecified: Secondary | ICD-10-CM | POA: Diagnosis not present

## 2014-02-25 DIAGNOSIS — C7971 Secondary malignant neoplasm of right adrenal gland: Secondary | ICD-10-CM | POA: Diagnosis present

## 2014-02-25 DIAGNOSIS — R531 Weakness: Secondary | ICD-10-CM

## 2014-02-25 DIAGNOSIS — T402X5A Adverse effect of other opioids, initial encounter: Secondary | ICD-10-CM | POA: Diagnosis present

## 2014-02-25 DIAGNOSIS — Z7902 Long term (current) use of antithrombotics/antiplatelets: Secondary | ICD-10-CM | POA: Diagnosis not present

## 2014-02-25 DIAGNOSIS — C7902 Secondary malignant neoplasm of left kidney and renal pelvis: Secondary | ICD-10-CM | POA: Diagnosis present

## 2014-02-25 DIAGNOSIS — G40909 Epilepsy, unspecified, not intractable, without status epilepticus: Secondary | ICD-10-CM | POA: Diagnosis present

## 2014-02-25 DIAGNOSIS — Z95828 Presence of other vascular implants and grafts: Secondary | ICD-10-CM

## 2014-02-25 DIAGNOSIS — K5909 Other constipation: Secondary | ICD-10-CM | POA: Diagnosis present

## 2014-02-25 DIAGNOSIS — N183 Chronic kidney disease, stage 3 (moderate): Secondary | ICD-10-CM | POA: Diagnosis present

## 2014-02-25 DIAGNOSIS — M5440 Lumbago with sciatica, unspecified side: Secondary | ICD-10-CM

## 2014-02-25 DIAGNOSIS — R569 Unspecified convulsions: Secondary | ICD-10-CM

## 2014-02-25 DIAGNOSIS — E1101 Type 2 diabetes mellitus with hyperosmolarity with coma: Secondary | ICD-10-CM

## 2014-02-25 HISTORY — DX: Unspecified osteoarthritis, unspecified site: M19.90

## 2014-02-25 HISTORY — DX: Anemia, unspecified: D64.9

## 2014-02-25 HISTORY — DX: Hyperkalemia: E87.5

## 2014-02-25 LAB — GLUCOSE, CAPILLARY
GLUCOSE-CAPILLARY: 327 mg/dL — AB (ref 70–99)
Glucose-Capillary: 353 mg/dL — ABNORMAL HIGH (ref 70–99)
Glucose-Capillary: 238 mg/dL — ABNORMAL HIGH (ref 70–99)

## 2014-02-25 LAB — BASIC METABOLIC PANEL
Anion gap: 8 (ref 5–15)
Anion gap: 8 (ref 5–15)
BUN: 57 mg/dL — ABNORMAL HIGH (ref 6–23)
BUN: 54 mg/dL — ABNORMAL HIGH (ref 6–23)
CALCIUM: 9 mg/dL (ref 8.4–10.5)
CO2: 21 mmol/L (ref 19–32)
CO2: 19 mmol/L (ref 19–32)
CREATININE: 1.36 mg/dL — AB (ref 0.50–1.35)
Calcium: 8.9 mg/dL (ref 8.4–10.5)
Chloride: 104 mEq/L (ref 96–112)
Chloride: 107 mEq/L (ref 96–112)
Creatinine, Ser: 1.58 mg/dL — ABNORMAL HIGH (ref 0.50–1.35)
GFR calc Af Amer: 54 mL/min — ABNORMAL LOW (ref >90)
GFR calc non Af Amer: 46 mL/min — ABNORMAL LOW (ref >90)
GFR, EST AFRICAN AMERICAN: 45 mL/min — AB (ref >90)
GFR, EST NON AFRICAN AMERICAN: 39 mL/min — AB (ref >90)
Glucose, Bld: 411 mg/dL — ABNORMAL HIGH (ref 70–99)
Glucose, Bld: 394 mg/dL — ABNORMAL HIGH (ref 70–99)
Potassium: 5.7 mmol/L — ABNORMAL HIGH (ref 3.5–5.1)
Potassium: 5.6 mmol/L — ABNORMAL HIGH (ref 3.5–5.1)
SODIUM: 133 mmol/L — AB (ref 135–145)
Sodium: 134 mmol/L — ABNORMAL LOW (ref 135–145)

## 2014-02-25 LAB — CBC WITH DIFFERENTIAL/PLATELET
BASOS ABS: 0.2 10*3/uL — AB (ref 0.0–0.1)
BASOS PCT: 1 % (ref 0–1)
Eosinophils Absolute: 1.2 10*3/uL — ABNORMAL HIGH (ref 0.0–0.7)
Eosinophils Relative: 6 % — ABNORMAL HIGH (ref 0–5)
HEMATOCRIT: 23.8 % — AB (ref 39.0–52.0)
Hemoglobin: 7.7 g/dL — ABNORMAL LOW (ref 13.0–17.0)
LYMPHS PCT: 12 % (ref 12–46)
Lymphs Abs: 2.4 10*3/uL (ref 0.7–4.0)
MCH: 29.1 pg (ref 26.0–34.0)
MCHC: 32.4 g/dL (ref 30.0–36.0)
MCV: 89.8 fL (ref 78.0–100.0)
MONOS PCT: 9 % (ref 3–12)
Monocytes Absolute: 1.8 10*3/uL — ABNORMAL HIGH (ref 0.1–1.0)
NEUTROS ABS: 14.6 10*3/uL — AB (ref 1.7–7.7)
Neutrophils Relative %: 72 % (ref 43–77)
Platelets: 361 10*3/uL (ref 150–400)
RBC: 2.65 MIL/uL — ABNORMAL LOW (ref 4.22–5.81)
RDW: 13.9 % (ref 11.5–15.5)
WBC Morphology: INCREASED
WBC: 20.2 10*3/uL — ABNORMAL HIGH (ref 4.0–10.5)

## 2014-02-25 LAB — URINALYSIS, ROUTINE W REFLEX MICROSCOPIC
BILIRUBIN URINE: NEGATIVE
Glucose, UA: 100 mg/dL — AB
HGB URINE DIPSTICK: NEGATIVE
KETONES UR: NEGATIVE mg/dL
LEUKOCYTES UA: NEGATIVE
Nitrite: NEGATIVE
PROTEIN: NEGATIVE mg/dL
SPECIFIC GRAVITY, URINE: 1.025 (ref 1.005–1.030)
Urobilinogen, UA: 0.2 mg/dL (ref 0.0–1.0)
pH: 5 (ref 5.0–8.0)

## 2014-02-25 LAB — VITAMIN B12: VITAMIN B 12: 1584 pg/mL — AB (ref 211–911)

## 2014-02-25 LAB — CBG MONITORING, ED
GLUCOSE-CAPILLARY: 397 mg/dL — AB (ref 70–99)
Glucose-Capillary: 399 mg/dL — ABNORMAL HIGH (ref 70–99)

## 2014-02-25 LAB — IRON AND TIBC
Iron: 36 ug/dL — ABNORMAL LOW (ref 42–165)
Saturation Ratios: 26 % (ref 20–55)
TIBC: 139 ug/dL — ABNORMAL LOW (ref 215–435)
UIBC: 103 ug/dL — ABNORMAL LOW (ref 125–400)

## 2014-02-25 LAB — RETICULOCYTES
RBC.: 3.22 MIL/uL — ABNORMAL LOW (ref 4.22–5.81)
RETIC CT PCT: 1.8 % (ref 0.4–3.1)
Retic Count, Absolute: 58 10*3/uL (ref 19.0–186.0)

## 2014-02-25 LAB — PHENYTOIN LEVEL, TOTAL

## 2014-02-25 LAB — FOLATE: Folate: 9.4 ng/mL

## 2014-02-25 LAB — POC OCCULT BLOOD, ED: Fecal Occult Bld: NEGATIVE

## 2014-02-25 LAB — PREPARE RBC (CROSSMATCH)

## 2014-02-25 LAB — BRAIN NATRIURETIC PEPTIDE: B NATRIURETIC PEPTIDE 5: 352.1 pg/mL — AB (ref 0.0–100.0)

## 2014-02-25 LAB — FERRITIN: Ferritin: 1243 ng/mL — ABNORMAL HIGH (ref 22–322)

## 2014-02-25 MED ORDER — CARBAMAZEPINE 200 MG PO TABS
400.0000 mg | ORAL_TABLET | Freq: Every day | ORAL | Status: DC
  Administered 2014-02-25 – 2014-03-01 (×5): 400 mg via ORAL
  Filled 2014-02-25 (×6): qty 2

## 2014-02-25 MED ORDER — SODIUM CHLORIDE 0.9 % IV SOLN: Freq: Once | INTRAVENOUS | Status: DC

## 2014-02-25 MED ORDER — ONDANSETRON HCL 4 MG/2ML IJ SOLN: 4.0000 mg | Freq: Four times a day (QID) | INTRAMUSCULAR | Status: DC | PRN

## 2014-02-25 MED ORDER — CARVEDILOL 25 MG PO TABS
25.0000 mg | ORAL_TABLET | Freq: Two times a day (BID) | ORAL | Status: DC
  Administered 2014-02-25 – 2014-02-28 (×7): 25 mg via ORAL
  Filled 2014-02-25 (×10): qty 1

## 2014-02-25 MED ORDER — SODIUM CHLORIDE 0.9 % IV BOLUS (SEPSIS): 1000.0000 mL | Freq: Once | INTRAVENOUS | Status: DC

## 2014-02-25 MED ORDER — DULOXETINE HCL 30 MG PO CPEP
30.0000 mg | ORAL_CAPSULE | Freq: Every day | ORAL | Status: DC
  Administered 2014-02-26 – 2014-03-01 (×4): 30 mg via ORAL
  Filled 2014-02-25 (×6): qty 1

## 2014-02-25 MED ORDER — SODIUM CHLORIDE 0.9 % IV BOLUS (SEPSIS)
1000.0000 mL | Freq: Once | INTRAVENOUS | Status: AC
  Administered 2014-02-25: 1000 mL via INTRAVENOUS

## 2014-02-25 MED ORDER — SODIUM POLYSTYRENE SULFONATE 15 GM/60ML PO SUSP
15.0000 g | Freq: Once | ORAL | Status: AC
  Administered 2014-02-25: 15 g via ORAL
  Filled 2014-02-25: qty 60

## 2014-02-25 MED ORDER — SODIUM CHLORIDE 0.9 % IV SOLN
INTRAVENOUS | Status: DC
  Administered 2014-02-25: 19:00:00 via INTRAVENOUS

## 2014-02-25 MED ORDER — ENSURE COMPLETE PO LIQD
237.0000 mL | Freq: Two times a day (BID) | ORAL | Status: DC
  Administered 2014-02-26: 237 mL via ORAL

## 2014-02-25 MED ORDER — OXYCODONE HCL 5 MG PO TABS
5.0000 mg | ORAL_TABLET | Freq: Once | ORAL | Status: AC
  Administered 2014-02-25: 5 mg via ORAL
  Filled 2014-02-25: qty 1

## 2014-02-25 MED ORDER — LEVETIRACETAM 500 MG PO TABS
500.0000 mg | ORAL_TABLET | Freq: Two times a day (BID) | ORAL | Status: DC
  Administered 2014-02-25 – 2014-03-01 (×9): 500 mg via ORAL
  Filled 2014-02-25 (×11): qty 1

## 2014-02-25 MED ORDER — FUROSEMIDE 10 MG/ML IJ SOLN
20.0000 mg | Freq: Once | INTRAMUSCULAR | Status: AC
  Administered 2014-02-26: 20 mg via INTRAVENOUS
  Filled 2014-02-25: qty 2

## 2014-02-25 MED ORDER — INSULIN GLARGINE 100 UNIT/ML ~~LOC~~ SOLN
15.0000 [IU] | Freq: Every day | SUBCUTANEOUS | Status: DC
  Administered 2014-02-25 – 2014-02-27 (×3): 15 [IU] via SUBCUTANEOUS
  Filled 2014-02-25 (×4): qty 0.15

## 2014-02-25 MED ORDER — TAMSULOSIN HCL 0.4 MG PO CAPS
0.4000 mg | ORAL_CAPSULE | Freq: Every day | ORAL | Status: DC
  Administered 2014-02-25 – 2014-03-01 (×5): 0.4 mg via ORAL
  Filled 2014-02-25 (×6): qty 1

## 2014-02-25 MED ORDER — SODIUM CHLORIDE 0.9 % IV SOLN
1.0000 g | Freq: Once | INTRAVENOUS | Status: AC
  Administered 2014-02-25: 1 g via INTRAVENOUS
  Filled 2014-02-25: qty 10

## 2014-02-25 MED ORDER — ACETAMINOPHEN 650 MG RE SUPP: 650.0000 mg | Freq: Four times a day (QID) | RECTAL | Status: DC | PRN

## 2014-02-25 MED ORDER — PANTOPRAZOLE SODIUM 40 MG PO TBEC
40.0000 mg | DELAYED_RELEASE_TABLET | Freq: Every day | ORAL | Status: DC
  Administered 2014-02-26 – 2014-03-01 (×4): 40 mg via ORAL
  Filled 2014-02-25 (×5): qty 1

## 2014-02-25 MED ORDER — INSULIN ASPART 100 UNIT/ML ~~LOC~~ SOLN
0.0000 [IU] | Freq: Three times a day (TID) | SUBCUTANEOUS | Status: DC
  Administered 2014-02-25: 7 [IU] via SUBCUTANEOUS
  Administered 2014-02-26: 5 [IU] via SUBCUTANEOUS
  Administered 2014-02-26 (×2): 3 [IU] via SUBCUTANEOUS
  Administered 2014-02-27 (×2): 2 [IU] via SUBCUTANEOUS
  Administered 2014-02-27: 5 [IU] via SUBCUTANEOUS
  Administered 2014-02-28: 2 [IU] via SUBCUTANEOUS
  Administered 2014-02-28: 1 [IU] via SUBCUTANEOUS

## 2014-02-25 MED ORDER — ACETAMINOPHEN 325 MG PO TABS: 650.0000 mg | ORAL_TABLET | Freq: Four times a day (QID) | ORAL | Status: DC | PRN

## 2014-02-25 MED ORDER — MIRTAZAPINE 15 MG PO TABS
15.0000 mg | ORAL_TABLET | Freq: Every day | ORAL | Status: DC
  Administered 2014-02-25 – 2014-03-01 (×5): 15 mg via ORAL
  Filled 2014-02-25 (×6): qty 1

## 2014-02-25 MED ORDER — VITAMIN B-12 1000 MCG PO TABS
1000.0000 ug | ORAL_TABLET | Freq: Every day | ORAL | Status: DC
  Administered 2014-02-26 – 2014-03-01 (×4): 1000 ug via ORAL
  Filled 2014-02-25 (×6): qty 1

## 2014-02-25 MED ORDER — SODIUM CHLORIDE 0.9 % IV BOLUS (SEPSIS)
500.0000 mL | Freq: Once | INTRAVENOUS | Status: AC
  Administered 2014-02-25: 500 mL via INTRAVENOUS

## 2014-02-25 MED ORDER — FLEET ENEMA 7-19 GM/118ML RE ENEM
1.0000 | ENEMA | Freq: Once | RECTAL | Status: AC
  Administered 2014-02-25: 1 via RECTAL
  Filled 2014-02-25: qty 1

## 2014-02-25 MED ORDER — DOCUSATE SODIUM 100 MG PO CAPS
100.0000 mg | ORAL_CAPSULE | Freq: Two times a day (BID) | ORAL | Status: DC
  Administered 2014-02-25 – 2014-03-01 (×9): 100 mg via ORAL
  Filled 2014-02-25 (×11): qty 1

## 2014-02-25 MED ORDER — SIMVASTATIN 40 MG PO TABS
40.0000 mg | ORAL_TABLET | Freq: Every day | ORAL | Status: DC
  Administered 2014-02-25 – 2014-03-01 (×5): 40 mg via ORAL
  Filled 2014-02-25 (×6): qty 1

## 2014-02-25 MED ORDER — ONDANSETRON HCL 4 MG PO TABS: 4.0000 mg | ORAL_TABLET | Freq: Four times a day (QID) | ORAL | Status: DC | PRN

## 2014-02-25 MED ORDER — OXYCODONE-ACETAMINOPHEN 5-325 MG PO TABS
1.0000 | ORAL_TABLET | ORAL | Status: DC | PRN
  Administered 2014-02-28: 1 via ORAL
  Filled 2014-02-25: qty 1

## 2014-02-25 NOTE — ED Notes (Addendum)
Pt here with daughter from home. Was seen on 4th for dehydration and back for same. Has had decreased intake and output which daughter states was real dark. Pt states he has bilateral lower back pain that is worse at times. No BM since Monday. Pt is diabetic as well. Pt has been having difficulty walking around by himself. Daughter has been having to use a walker and wheelchair with him.

## 2014-02-25 NOTE — Progress Notes (Signed)
[name] [id number] Admitted to 7W29: 02/23/2014 7:45 PM Attending Provider: Elmarie Shiley, MD    [name] is a 81 y.o. male patient admitted from ED awake, alert  & orientated  X 3, self & place,  Full Code, VSS - Blood pressure 153/62, pulse 70, temperature 97.4 F (36.3 C), temperature source Oral, resp. rate 16, height 5\' 11"  (1.803 m), weight 81.7 kg (180 lb 1.9 oz), SpO2 95 %., R/A L, with shortness of breath & audible wheezing, no c/o chest pain, no distress noted. Tele # 6 placed and pt is currently running:first degree heart block.   IV site WDL:  hand right, condition patent and no redness with a transparent dsg that's clean dry and intact.  Allergies:  No Known Allergies   Past Medical History  Diagnosis Date  . CAD (coronary artery disease)     s/p cabg 3/10...s/p MI age 54  . HTN (hypertension)   . HLD (hyperlipidemia)   . Anxiety disorder   . Tremor   . GERD (gastroesophageal reflux disease)   . Pulmonary vein stenosis   . Angina   . Chronic kidney disease   . Anxiety   . Myocardial infarction 1970's; 2010  . Aspiration pneumonia     History of aspiration pneumonia with vent-dependent respiratory/notes 06/12/2008 (12/26/2012)  . Orthopnoea     "just recently" (12/26/2012)  . History of stomach ulcers     "long time ago" (12/26/2012)  . Seizures     "I've had them all my life; last one was 04/2008" (12/26/2012)  . Chronic lower back pain   . Depression     "used to have problems w/depression" (12/26/2012)  . Fall at home 12/25/2012    "stumbled over cord while I was blowing leaves; broke my right hip" (12/26/2012)  . CVA (cerebral infarction) 01/07/2013    Acute right brain stroke  . Fracture of femoral neck, right 01/03/2013  . Diabetes mellitus type II     INSULIN DEPENDENT  . Arthritis   . Anemia   . Hyperkalemia 02/23/2014    History:  obtained from pt. daughter by admission nurse.  Pt orientation to unit, room and routine.  Information packet given to patient/family.  Admission INP armband ID verified with patient/family, and in place. SR up x 2, fall risk assessment complete with Patient and family verbalizing understanding of risks associated with falls. Pt verbalizes an understanding of how to use the call bell and to call for help before getting out of bed.  Skin, clean-dry- intact with evidence of bruising to bilat. arms, or skin tears.   No evidence of skin break down noted on exam.    Will cont to monitor and assist as needed.  Lindalou Hose, RN 02/21/2014 7:45 PM

## 2014-02-25 NOTE — ED Notes (Signed)
Hospitalist at bedside 

## 2014-02-25 NOTE — ED Notes (Signed)
Admit Doctor at bedside.  

## 2014-02-25 NOTE — ED Notes (Signed)
Dr. Docherty at the bedside.  

## 2014-02-25 NOTE — ED Notes (Signed)
Called to give report to; RN to call back [phone].

## 2014-02-25 NOTE — H&P (Addendum)
[facility] History and Physical  [name] MRN:[id number] DOB: 01/19/1929 DOA: 03/04/2014  Referring physician: ED PCP: WILSON,FRED HENRY, MD   Chief Complaint: back pain, unable to ambulate.   HPI: [name] is a 81 y.o. male with PMH significant for seizure disorder, CKD stage III, diabetes, CVA who presents with worsening back pain for last 2 moth, also inability to ambulate for last week. He has had decrease appetite, poor oral intake, no Bowel movement for last week. He was able to use a walker to ambulate, over last week he has been using a wheelchair to move around. He relates bilateral lower extremities numbness, and tingling.  No dyspnea, no chest pain, no abdominal pain, no nausea or vomiting. No history of melena or hematemesis.  He was seen in the ED last Monday with back pain, dehydration. He was send home on Cymbalta, prednisone taper and opioid. He has been feeling depressed. Per daughter he has been sleeping a lot.   Evaluation in the ED; he was found to have worsening renal function, Cr at 1.5, Hyperkalemia , potasium at 5.7. WBC at 20. Chest x ray with interstitial edema, KUB with increase stool burden.  He received IV fluids and calcium gluconate in the ED.   Review of Systems:  Negative, except as per HPI  Past Medical History  Diagnosis Date  . CAD (coronary artery disease)     s/p cabg 3/10...s/p MI age 44  . Diabetes mellitus type II   . HTN (hypertension)   . HLD (hyperlipidemia)   . Anxiety disorder   . Tremor   . GERD (gastroesophageal reflux disease)   . Pulmonary vein stenosis   . Angina   . Chronic kidney disease   . Anxiety   . Myocardial infarction 1970's; 2010  . Aspiration pneumonia     History of aspiration pneumonia with vent-dependent respiratory/notes 06/12/2008 (12/26/2012)  . Orthopnoea     "just recently" (12/26/2012)  . History of stomach ulcers     "long time ago" (12/26/2012)  . Seizures     "I've had them all my  life; last one was 04/2008" (12/26/2012)  . Chronic lower back pain   . Depression     "used to have problems w/depression" (12/26/2012)  . Fall at home 12/25/2012    "stumbled over cord while I was blowing leaves; broke my right hip" (12/26/2012)  . CVA (cerebral infarction) 01/07/2013    Acute right brain stroke  . Fracture of femoral neck, right 01/03/2013   Past Surgical History  Procedure Laterality Date  . Coronary artery bypass graft  04/2008    "CABG X3" (12/26/2012)  . Cataract extraction w/ intraocular lens  implant, bilateral Bilateral ~2004  . Hip arthroplasty Right 12/26/2012    Procedure: Right Hip Hemiarthroplasty;  Surgeon: Timothy D Murphy, MD;  Location: [facility] OR;  Service: Orthopedics;  Laterality: Right;  . Inguinal hernia repair Right    Social History:  reports that he has quit smoking. He has quit using smokeless tobacco. His smokeless tobacco use included Chew. He reports that he drinks alcohol. He reports that he does not use illicit drugs.  No Known Allergies  Family History  Problem Relation Age of Onset  . Coronary artery disease    . Hypertension       Prior to Admission medications   Medication Sig Start Date End Date Taking? Authorizing Provider  aspirin 81 MG chewable tablet Chew 81 mg by mouth daily.    Historical   Provider, MD  carbamazepine (EPITOL) 200 MG tablet Take 400 mg by mouth at bedtime.    Historical Provider, MD  carvedilol (COREG) 25 MG tablet Take 25 mg by mouth 2 (two) times daily with a meal.     Historical Provider, MD  clopidogrel (PLAVIX) 75 MG tablet Take 1 tablet (75 mg total) by mouth daily with breakfast. 05/20/12   Belkys A Regalado, MD  Cyanocobalamin (B-12) 1000 MCG CAPS Take 1 capsule by mouth daily.    Historical Provider, MD  Exenatide ER 2 MG PEN Inject 2 mg into the skin once a week. On Monday    Historical Provider, MD  insulin glargine (LANTUS) 100 units/mL SOLN Inject 30 Units into the skin at bedtime.     Historical  Provider, MD  levETIRAcetam (KEPPRA) 500 MG tablet Take 1 tablet (500 mg total) by mouth 2 (two) times daily. 02/15/13   Donne Hazel, MD  meloxicam (MOBIC) 7.5 MG tablet Take 7.5 mg by mouth daily.    Historical Provider, MD  mirtazapine (REMERON) 15 MG tablet Take 1 tablet (15 mg total) by mouth at bedtime. 01/10/13   Ripudeep Krystal Eaton, MD  oxyCODONE-acetaminophen (PERCOCET/ROXICET) 5-325 MG per tablet Take 1 tablet by mouth every 4 (four) hours as needed for severe pain. 02/18/14   Sharyon Cable, MD  pantoprazole (PROTONIX) 40 MG tablet Take 40 mg by mouth daily.     Historical Provider, MD  simvastatin (ZOCOR) 40 MG tablet Take 40 mg by mouth at bedtime.     Historical Provider, MD  Tamsulosin HCl (FLOMAX) 0.4 MG CAPS Take 0.4 mg by mouth at bedtime.     Historical Provider, MD  tuberculin 5 UNIT/0.1ML injection Inject 5 Units into the skin See admin instructions. Every Sun and Friday for 3 days for first step PPD    Historical Provider, MD   Physical Exam: Filed Vitals:   03/03/2014 1334 03/09/2014 1400 03/08/2014 1430 03/04/2014 1504  BP:  150/42 145/60 153/62  Pulse:  72 70   Temp:    97.4 F (36.3 C)  TempSrc:    Oral  Resp:  _0 Height:    5' 11" (1.803 m)  Weight:    81.7 kg (180 lb 1.9 oz)  SpO2: 95% 96% 96% 95%    Wt Readings from Last 3 Encounters:  03/07/2014 81.7 kg (180 lb 1.9 oz)  02/18/14 81.647 kg (180 lb)  02/15/13 80.151 kg (176 lb 11.2 oz)    General:  Appears calm and comfortable Eyes: PERRL, normal lids, irises & pale conjunctiva ENT: grossly normal hearing, lips & tongue very dry Neck: no LAD, masses or thyromegaly Cardiovascular: RRR, no m/r/g. No LE edema. Telemetry: SR, no arrhythmias  Respiratory: no w/r/r. Normal respiratory effort. Sporadic crackles.  Abdomen: soft, nt,nd, distended.  Skin: no rash or induration seen on limited exam Musculoskeletal: grossly normal tone BUE/BLE Neurologic: grossly non-focal.          Labs on Admission:  Basic  Metabolic Panel:  Recent Labs Lab 02/18/14 1522 03/15/2014 1052  NA 135 133*  K 5.1 5.7*  CL 104 104  CO2 17* 21  GLUCOSE 213* 411*  BUN 31* 57*  CREATININE 1.48* 1.58*  CALCIUM 9.1 8.9   Liver Function Tests: No results for input(s): AST, ALT, ALKPHOS, BILITOT, PROT, ALBUMIN in the last 168 hours. No results for input(s): LIPASE, AMYLASE in the last 168 hours. No results for input(s): AMMONIA in the last 168 hours. CBC:  Recent Labs Lab 02/18/14 1522 02/18/2014 1052  WBC 15.9* 20.2*  NEUTROABS 10.2* 14.6*  HGB 10.2* 7.7*  HCT 31.5* 23.8*  MCV 90.5 89.8  PLT 376 361   Cardiac Enzymes: No results for input(s): CKTOTAL, CKMB, CKMBINDEX, TROPONINI in the last 168 hours.  BNP (last 3 results) No results for input(s): PROBNP in the last 8760 hours. CBG:  Recent Labs Lab 02/17/2014 1054 02/20/2014 1245  GLUCAP 399* 397*    Radiological Exams on Admission: Dg Abd Acute W/chest  03/05/2014   CLINICAL DATA:  Shortness of breath, constipation  EXAM: ACUTE ABDOMEN SERIES (ABDOMEN 2 VIEW & CHEST 1 VIEW)  COMPARISON:  .  Chest x-ray dated February 18, 2014  FINDINGS: The lungs are reasonably well inflated. The interstitial markings are more conspicuous bilaterally. The hemidiaphragms are less well demonstrated. The cardiac silhouette is mildly enlarged though stable. The pulmonary vascularity is indistinct.  Within the abdomen there is increased stool burden throughout the colon. There is no large or small bowel obstructive pattern. No free extraluminal gas collections are demonstrated.  IMPRESSION: 1. Increased stool burden throughout the colon is consistent with clinical constipation. There is no evidence of obstruction. 2. Worsening appearance of the pulmonary interstitium is consistent with interstitial edema likely secondary to CHF. There has been interval development of small bilateral pleural effusions and minimal bibasilar atelectasis.   Electronically Signed   By: David  Jordan    On: 02/20/2014 12:47    EKG: Independently reviewed. Sinus rhythm, prolong PR.   Assessment/Plan Principal Problem:   Hyperkalemia Active Problems:   Convulsions   Type 2 diabetes mellitus with hyperosmolar nonketotic hyperglycemia   Acute renal failure   Constipation   Lumbago  1-Acute on chronic renal failure, stage III; -insetting of dehydration, poor oral intake.  -hydration, monitor for volume overload.  -Repeat labs in am.  -bladder scan, need foley catheter if urine retention.   Anemia:  -hb decrease to 7 from 10 last week. Patient pale. Will transfuse 1 unit. Check anemia panel. Check occult blood. Hold aspirin , plavix for now.   Hyperkalemia;  -received IV fluids, calcium gluconate. Repeat B-met . Will order kayexalate if k still elevated.   Constipation; in setting of opioids.  fleet enema, start docusate.   Back pain, weakness, inability to ambulate, lower extremities numbness.\Will order MRI lumbar spine. PT consult. Continue with prednisone for now.   Leukocytosis; probably related to prednisone. Follow up urine culture. If patient spike fever will consider antibiotics.   Seizure: continue with Keppra and Carbamazepine.   Diabetes; uncontrolled. Continue with lantus, decrease dose in setting of renal failure and poor oral intake. SSI.   Depression: continue with Cymbalta, started last week.    Code Status: Full Code.  DVT Prophylaxis: SCD, hold anticoagulation due to drop in hb,.  Family Communication: Care discussed with daughter) Disposition Plan: expect 2 to 3 days inpatient./   Time spent: 75 minutes.   Regalado, Belkys A [facility] Pager [phone]   

## 2014-02-25 NOTE — Progress Notes (Signed)
[facility]  Patient Status: Active (receiving services up to time of hospitalization)  [facility] is providing the following services: RN and PT  If patient discharges after hours, please call [phone].   Consepcion Hearing 03/16/2014, 3:59 PM

## 2014-02-25 NOTE — ED Notes (Signed)
Admit Doctor at bedside stated to use bladder scanner on patient.

## 2014-02-25 NOTE — ED Notes (Signed)
Doctor and Nurse stood patient up stated  He was weak and took two small steps with assist.

## 2014-02-25 NOTE — ED Notes (Signed)
In and out cath performed by nurse and nurse tech in room.

## 2014-02-25 NOTE — ED Notes (Signed)
CBG 399 

## 2014-02-25 NOTE — Progress Notes (Signed)
Report received from [place], RN in ED.  Will await pt. Arrival to the floor.  Alphonzo Lemmings, RN

## 2014-02-26 ENCOUNTER — Inpatient Hospital Stay (HOSPITAL_COMMUNITY): Payer: Commercial Managed Care - HMO

## 2014-02-26 DIAGNOSIS — R062 Wheezing: Secondary | ICD-10-CM | POA: Insufficient documentation

## 2014-02-26 DIAGNOSIS — R627 Adult failure to thrive: Secondary | ICD-10-CM

## 2014-02-26 LAB — GLUCOSE, CAPILLARY
GLUCOSE-CAPILLARY: 270 mg/dL — AB (ref 70–99)
GLUCOSE-CAPILLARY: 213 mg/dL — AB (ref 70–99)
GLUCOSE-CAPILLARY: 200 mg/dL — AB (ref 70–99)
GLUCOSE-CAPILLARY: 190 mg/dL — AB (ref 70–99)

## 2014-02-26 LAB — TYPE AND SCREEN
ABO/RH(D): AB POS
Antibody Screen: NEGATIVE
Unit division: 0

## 2014-02-26 LAB — HEPATIC FUNCTION PANEL
ALT: 12 U/L (ref 0–53)
AST: 16 U/L (ref 0–37)
Albumin: 1.8 g/dL — ABNORMAL LOW (ref 3.5–5.2)
Alkaline Phosphatase: 129 U/L — ABNORMAL HIGH (ref 39–117)
BILIRUBIN TOTAL: 0.6 mg/dL (ref 0.3–1.2)
Bilirubin, Direct: 0.1 mg/dL (ref 0.0–0.3)
Indirect Bilirubin: 0.5 mg/dL (ref 0.3–0.9)
TOTAL PROTEIN: 5.6 g/dL — AB (ref 6.0–8.3)

## 2014-02-26 LAB — CBC
HEMATOCRIT: 33.6 % — AB (ref 39.0–52.0)
Hemoglobin: 10.9 g/dL — ABNORMAL LOW (ref 13.0–17.0)
MCH: 28.3 pg (ref 26.0–34.0)
MCHC: 32.4 g/dL (ref 30.0–36.0)
MCV: 87.3 fL (ref 78.0–100.0)
Platelets: 262 10*3/uL (ref 150–400)
RBC: 3.85 MIL/uL — ABNORMAL LOW (ref 4.22–5.81)
RDW: 14.2 % (ref 11.5–15.5)
WBC: 18.2 10*3/uL — ABNORMAL HIGH (ref 4.0–10.5)

## 2014-02-26 LAB — PROTIME-INR
INR: 1.14 (ref 0.00–1.49)
Prothrombin Time: 14.8 seconds (ref 11.6–15.2)

## 2014-02-26 LAB — BASIC METABOLIC PANEL
Anion gap: 7 (ref 5–15)
BUN: 53 mg/dL — ABNORMAL HIGH (ref 6–23)
CHLORIDE: 110 meq/L (ref 96–112)
CO2: 17 mmol/L — AB (ref 19–32)
CREATININE: 1.34 mg/dL (ref 0.50–1.35)
Calcium: 8.4 mg/dL (ref 8.4–10.5)
GFR calc non Af Amer: 47 mL/min — ABNORMAL LOW (ref >90)
GFR, EST AFRICAN AMERICAN: 55 mL/min — AB (ref >90)
GLUCOSE: 259 mg/dL — AB (ref 70–99)
POTASSIUM: 4.9 mmol/L (ref 3.5–5.1)
Sodium: 134 mmol/L — ABNORMAL LOW (ref 135–145)

## 2014-02-26 LAB — URINE CULTURE
COLONY COUNT: NO GROWTH
Culture: NO GROWTH

## 2014-02-26 MED ORDER — GLUCERNA SHAKE PO LIQD
237.0000 mL | Freq: Three times a day (TID) | ORAL | Status: DC
  Administered 2014-02-26 – 2014-03-02 (×10): 237 mL via ORAL

## 2014-02-26 MED ORDER — CETYLPYRIDINIUM CHLORIDE 0.05 % MT LIQD
7.0000 mL | Freq: Two times a day (BID) | OROMUCOSAL | Status: DC
  Administered 2014-02-26 – 2014-03-01 (×5): 7 mL via OROMUCOSAL

## 2014-02-26 MED ORDER — IPRATROPIUM-ALBUTEROL 0.5-2.5 (3) MG/3ML IN SOLN
3.0000 mL | RESPIRATORY_TRACT | Status: DC
  Administered 2014-02-26 (×5): 3 mL via RESPIRATORY_TRACT
  Filled 2014-02-26 (×4): qty 3

## 2014-02-26 MED ORDER — POLYETHYLENE GLYCOL 3350 17 G PO PACK
17.0000 g | PACK | Freq: Every day | ORAL | Status: DC
  Administered 2014-02-27 – 2014-03-01 (×3): 17 g via ORAL
  Filled 2014-02-26 (×6): qty 1

## 2014-02-26 MED ORDER — SODIUM POLYSTYRENE SULFONATE 15 GM/60ML PO SUSP
15.0000 g | Freq: Once | ORAL | Status: DC
  Filled 2014-02-26: qty 60

## 2014-02-26 MED ORDER — SENNOSIDES-DOCUSATE SODIUM 8.6-50 MG PO TABS: 2.0000 | ORAL_TABLET | Freq: Two times a day (BID) | ORAL | Status: DC

## 2014-02-26 MED ORDER — SODIUM CHLORIDE 0.9 % IV SOLN
INTRAVENOUS | Status: DC
  Administered 2014-02-27: 1000 mL via INTRAVENOUS
  Administered 2014-02-28: 01:00:00 via INTRAVENOUS

## 2014-02-26 MED ORDER — IPRATROPIUM-ALBUTEROL 0.5-2.5 (3) MG/3ML IN SOLN
RESPIRATORY_TRACT | Status: AC
  Administered 2014-02-26: 3 mL via RESPIRATORY_TRACT
  Filled 2014-02-26: qty 3

## 2014-02-26 NOTE — Evaluation (Signed)
Physical Therapy Evaluation Patient Details Name: [name] MRN: [id number] DOB: 1928/05/19 Today's Date: 02/26/2014   History of Present Illness  81 y.o. male with PMH significant for seizure disorder, CKD stage III, diabetes, CVA who presents with worsening back pain for last 2 moth, also inability to ambulate for last week. He has had decrease appetite, poor oral intake, no Bowel movement for last week. He was able to use a walker to ambulate, over last week he has been using a wheelchair to move around. He relates bilateral lower extremities numbness, and tingling.  Clinical Impression  Pt admitted with the above complications. Pt currently with functional limitations due to the deficits listed below (see PT Problem List). Mod assist for bed mobility and transfers. Unable to stand fully upright, nor is he able to ambulate at this time due to LE weakness. Decreased LE sensation to light touch throughout BIL LEs with 3+/5 muscle strength grossly throughout LEs (although limited assessment/accuracy due cognition). Requires SNF for rehabilitation and pt safety. Pt will benefit from skilled PT to increase their independence and safety with mobility to allow discharge to the venue listed below.    Follow Up Recommendations SNF;Supervision/Assistance - 24 hour    Equipment Recommendations   (TBD)    Recommendations for Other Services       Precautions / Restrictions Precautions Precautions: Fall Precaution Comments: Pt is serious fall risk and fell in hospital on day of admission. Restrictions Weight Bearing Restrictions: No      Mobility  Bed Mobility Overal bed mobility: Needs Assistance Bed Mobility: Supine to Sit     Supine to sit: Mod assist;HOB elevated     General bed mobility comments: pt in chair on arriva.  Transfers Overall transfer level: Needs assistance Equipment used: Rolling walker (2 wheeled) Transfers: Sit to/from Stand Sit to Stand: Max assist    Squat pivot transfers: Mod assist     General transfer comment: If doing any more than just standing, +2 is necessary.  pt needed cues for safe hand placement   Ambulation/Gait                Stairs            Wheelchair Mobility    Modified Rankin (Stroke Patients Only)       Balance Overall balance assessment: Needs assistance Sitting-balance support: Feet supported Sitting balance-Leahy Scale: Fair   Postural control: Right lateral lean Standing balance support: Bilateral upper extremity supported;During functional activity Standing balance-Leahy Scale: Zero Standing balance comment: pt needed a lot of external support to stay in partial standing.  Unable to achieve full standing.                             Pertinent Vitals/Pain Pain Assessment: No/denies pain Pain Score: 5  Pain Location: back and right hip Pain Descriptors / Indicators: Tingling Pain Intervention(s): Monitored during session;Repositioned    Home Living Family/patient expects to be discharged to:: Unsure Living Arrangements: Children Available Help at Discharge: Family Type of Home: Mobile home Home Access: Stairs to enter Entrance Stairs-Rails: Psychiatric nurse of Steps: 3 Home Layout: One level Home Equipment: Walker - 4 wheels;Bedside commode;Shower seat      Prior Function Level of Independence: Needs assistance   Gait / Transfers Assistance Needed: rollator for ambulation  ADL's / Homemaking Assistance Needed: Daughter assists with bathing        Hand Dominance   Dominant  Hand: Right    Extremity/Trunk Assessment   Upper Extremity Assessment: Generalized weakness           Lower Extremity Assessment: Defer to PT evaluation RLE Deficits / Details: 3+/5 strength throughout - difficult to determine accuracy due to cognition.  LLE Deficits / Details: 3+/5 strength throughout - difficult to determine accuracy due to  cognition.  Cervical / Trunk Assessment: Normal  Communication   Communication: Other (comment) (difficult to understand.)  Cognition Arousal/Alertness: Awake/alert Behavior During Therapy: Flat affect;Impulsive Overall Cognitive Status: No family/caregiver present to determine baseline cognitive functioning       Memory: Decreased short-term memory;Decreased recall of precautions              General Comments General comments (skin integrity, edema, etc.): pt not safe to be at home.  Stated he was home alone some.  Pt needs to not be left alone.    Exercises        Assessment/Plan    PT Assessment Patient needs continued PT services  PT Diagnosis Difficulty walking;Generalized weakness;Acute pain;Altered mental status   PT Problem List Decreased strength;Decreased range of motion;Decreased activity tolerance;Decreased balance;Decreased mobility;Decreased cognition;Decreased knowledge of use of DME;Decreased safety awareness;Decreased knowledge of precautions;Impaired sensation;Pain  PT Treatment Interventions Gait training;DME instruction;Functional mobility training;Therapeutic activities;Therapeutic exercise;Balance training;Neuromuscular re-education;Cognitive remediation;Patient/family education;Modalities   PT Goals (Current goals can be found in the Care Plan section) Acute Rehab PT Goals Patient Stated Goal: none state. PT Goal Formulation: With patient Time For Goal Achievement: 03/12/14 Potential to Achieve Goals: Fair    Frequency Min 3X/week   Barriers to discharge Decreased caregiver support pt does not have 24/7 care    Co-evaluation               End of Session Equipment Utilized During Treatment: Gait belt Activity Tolerance: Patient limited by fatigue Patient left: in chair;with call bell/phone within reach;with chair alarm set Nurse Communication: Mobility status;Need for lift equipment;Precautions         Time: 9678-9381 PT Time  Calculation (min) (ACUTE ONLY): 38 min   Charges:   PT Evaluation $Initial PT Evaluation Tier I: 1 Procedure PT Treatments $Therapeutic Activity: 23-37 mins   PT G Codes:        Ellouise Newer 02/26/2014, 12:54 PM Camille Bal [place], [place]

## 2014-02-26 NOTE — Evaluation (Signed)
Occupational Therapy Evaluation Patient Details Name: [name] MRN: [id number] DOB: Nov 18, 1928 Today's Date: 02/26/2014    History of Present Illness 81 y.o. male with PMH significant for seizure disorder, CKD stage III, diabetes, CVA who presents with worsening back pain for last 2 moth, also inability to ambulate for last week. He has had decrease appetite, poor oral intake, no Bowel movement for last week. He was able to use a walker to ambulate, over last week he has been using a wheelchair to move around. He relates bilateral lower extremities numbness, and tingling.   Clinical Impression   Pt admitted for above diagnosis and has the deficits listed below. Pt would benefit from cont OT to increase independence with the basic adls and adl transfers so he can eventually return home to live with his daughter.  At this point, pt needs 24 hour care at a SNF before returning home.    Follow Up Recommendations  SNF;Supervision/Assistance - 24 hour    Equipment Recommendations  Other (comment) (TBD next venue)    Recommendations for Other Services       Precautions / Restrictions Precautions Precautions: Fall Precaution Comments: Pt is serious fall risk and fell in hospital on day of admission. Restrictions Weight Bearing Restrictions: No      Mobility Bed Mobility               General bed mobility comments: pt in chair on arriva.  Transfers Overall transfer level: Needs assistance Equipment used: Rolling walker (2 wheeled) Transfers: Sit to/from Stand Sit to Stand: Max assist         General transfer comment: If doing any more than just standing, +2 is necessary.  pt needed cues for safe hand placement     Balance Overall balance assessment: Needs assistance Sitting-balance support: Feet supported Sitting balance-Leahy Scale: Fair     Standing balance support: Bilateral upper extremity supported;During functional activity Standing balance-Leahy Scale:  Zero Standing balance comment: pt needed a lot of external support to stay in partial standing.  Unable to achieve full standing.                            ADL Overall ADL's : Needs assistance/impaired Eating/Feeding: Minimal assistance;Sitting Eating/Feeding Details (indicate cue type and reason): had difficulty getting cup to mouth to eat. Grooming: Oral care;Minimal assistance;Cueing for sequencing;Sitting Grooming Details (indicate cue type and reason): pt very slow to move and required cues to tip cup up to get water out of it to rinse. Upper Body Bathing: Moderate assistance;Sitting Upper Body Bathing Details (indicate cue type and reason): pt requires cues to initiate and continue task Lower Body Bathing: Maximal assistance;Sit to/from stand Lower Body Bathing Details (indicate cue type and reason): pt has great difficulty standing and will need +2 to bathe Upper Body Dressing : Moderate assistance;Sitting Upper Body Dressing Details (indicate cue type and reason): Pt unable to initate task and follow through Lower Body Dressing: Total assistance;+2 for physical assistance;Sit to/from stand Lower Body Dressing Details (indicate cue type and reason): pt unable to cross legs and needs +2 to stand let alone pull pants up. Toilet Transfer: +2 for physical assistance;Total assistance;BSC Toilet Transfer Details (indicate cue type and reason): pt currently using lift for most transfers.  If not, +2 assist needed. Toileting- Clothing Manipulation and Hygiene: +2 for physical assistance;Total assistance;Sit to/from stand Toileting - Clothing Manipulation Details (indicate cue type and reason): one person needed to  stand pt, another needed to clean and manage clothing.     Functional mobility during ADLs: Maximal assistance General ADL Comments: Pt overall needing a great amount of assist for all adls.  pt very weak when standing and pt's cognition is keeping him from being  functional.     Vision                     Perception     Praxis      Pertinent Vitals/Pain Pain Assessment: No/denies pain Pain Score: 5  Pain Location: back and right hip Pain Descriptors / Indicators: Tingling Pain Intervention(s): Monitored during session;Repositioned     Hand Dominance Right   Extremity/Trunk Assessment Upper Extremity Assessment Upper Extremity Assessment: Generalized weakness   Lower Extremity Assessment Lower Extremity Assessment: Defer to PT evaluation   Cervical / Trunk Assessment Cervical / Trunk Assessment: Normal   Communication Communication Communication: Other (comment) (difficult to understand.)   Cognition Arousal/Alertness: Awake/alert Behavior During Therapy: Flat affect;Impulsive Overall Cognitive Status: No family/caregiver present to determine baseline cognitive functioning       Memory: Decreased short-term memory;Decreased recall of precautions             General Comments       Exercises       Shoulder Instructions      Home Living Family/patient expects to be discharged to:: Unsure Living Arrangements: Children Available Help at Discharge: Family Type of Home: Mobile home Home Access: Stairs to enter Entrance Stairs-Number of Steps: 3 Entrance Stairs-Rails: Right;Left Home Layout: One level               Home Equipment: Walker - 4 wheels;Bedside commode;Shower seat          Prior Functioning/Environment Level of Independence: Needs assistance  Gait / Transfers Assistance Needed: rollator for ambulation ADL's / Homemaking Assistance Needed: Daughter assists with bathing Communication / Swallowing Assistance Needed: pt dysarthric and hard to understand.      OT Diagnosis: Generalized weakness;Cognitive deficits;Acute pain;Altered mental status   OT Problem List: Decreased strength;Decreased activity tolerance;Decreased range of motion;Impaired balance (sitting and/or  standing);Decreased cognition;Decreased safety awareness;Decreased knowledge of use of DME or AE;Decreased knowledge of precautions;Pain   OT Treatment/Interventions: Self-care/ADL training;DME and/or AE instruction;Therapeutic activities;Cognitive remediation/compensation    OT Goals(Current goals can be found in the care plan section) Acute Rehab OT Goals Patient Stated Goal: none state. OT Goal Formulation: Patient unable to participate in goal setting Time For Goal Achievement: 03/12/14 Potential to Achieve Goals: Fair ADL Goals Pt Will Perform Eating: with set-up;sitting Pt Will Perform Grooming: with supervision;sitting Pt Will Perform Upper Body Bathing: with set-up;sitting Pt Will Transfer to Toilet: with mod assist;bedside commode;stand pivot transfer Additional ADL Goal #1: Pt will stand for appx 1 minute at sink with min guard to prepare for adls in standing.  OT Frequency: Min 2X/week   Barriers to D/C: Decreased caregiver support  only has daughter to assist and she is not there 24/7.  at current level, pt really needs two people to assist.       Co-evaluation              End of Session Equipment Utilized During Treatment: Rolling walker Nurse Communication: Mobility status  Activity Tolerance: Patient limited by fatigue Patient left: in chair;with call bell/phone within reach;with chair alarm set   Time: 1110-1140 OT Time Calculation (min): 30 min Charges:  OT General Charges $OT Visit: 1 Procedure OT Evaluation $Initial OT Evaluation  Tier I: 1 Procedure OT Treatments $Self Care/Home Management : 23-37 mins G-Codes:    Glenford Peers 2014/03/05, 12:11 PM  [phone]

## 2014-02-26 NOTE — Clinical Social Work Psychosocial (Signed)
Clinical Social Work Department BRIEF PSYCHOSOCIAL ASSESSMENT 02/26/2014  Patient:  [name], [name]     Account Number:  [id number]     Admit date:  03/12/2014  Clinical Social Worker:  Lovey Newcomer  Date/Time:  02/26/2014 01:00 PM  Referred by:  Physician  Date Referred:  02/26/2014 Referred for  SNF Placement   Other Referral:   NA   Interview type:  Patient Other interview type:   Patient and daughter interviewed to complete assessment.    PSYCHOSOCIAL DATA Living Status:  FAMILY Admitted from facility:   Level of care:   Primary support name:  [name] Primary support relationship to patient:  CHILD, ADULT Degree of support available:   Support is strong.    CURRENT CONCERNS Current Concerns  Post-Acute Placement   Other Concerns:   NA    SOCIAL WORK ASSESSMENT / PLAN CSW met with patient at bedside and spoke with daughter by phone to complete assessment. Patient states that he lives at home with his daughter and states that he does not want to go to a SNF at discharge. Patient states that he plans to return to his daughter's home when medically stable. CSW asked if this is something that CSW can discuss with the daughter, patient is agreeable to this. Patient's daughter [name] is in agreement to SNF placement at discharge. CSW explained SNF search/placement process and answered questions. CSW made daughter aware that the patient and family would need to choose a facility quickly due to the insurance authorization process for [facility]. Patient and daughter were both calm and engaged in assessment. CSW will follow.   Assessment/plan status:  Psychosocial Support/Ongoing Assessment of Needs Other assessment/ plan:   Complete Fl2, Fax, PASRR   Information/referral to community resources:   CSW contact information and SNF list  given.    PATIENT'S/FAMILY'S RESPONSE TO PLAN OF CARE: Patient and patent's daughter agreeable to looking at SNF options. Patient  appears to want to return home, but family is on board for plan for SNF.       Liz Beach MSW, [address], [place], [phone]

## 2014-02-26 NOTE — ED Provider Notes (Signed)
CSN: [id number]     Arrival date & time 02/17/2014  1004 History   First MD Initiated Contact with Patient 02/15/2014 1026     Chief Complaint  Patient presents with  . Dehydration     (Consider location/radiation/quality/duration/timing/severity/associated sxs/prior Treatment) HPI Comments: multiple complaints form home including anorexia, generalized weakness, constipation, and chronic low back pain. On PE, Pt pale, chronically ill appearing. 4/5 strength in BLLE, with nml sensation & reflexes. Pt can stand, but cannot ambulate due to weakness & back pain.   Past Medical History  Diagnosis Date  . CAD (coronary artery disease)     s/p cabg 3/10...s/p MI age 16  . HTN (hypertension)   . HLD (hyperlipidemia)   . Anxiety disorder   . Tremor   . GERD (gastroesophageal reflux disease)   . Pulmonary vein stenosis   . Angina   . Chronic kidney disease   . Anxiety   . Myocardial infarction 1970's; 2010  . Aspiration pneumonia     History of aspiration pneumonia with vent-dependent respiratory/notes 06/12/2008 (12/26/2012)  . Orthopnoea     "just recently" (12/26/2012)  . History of stomach ulcers     "long time ago" (12/26/2012)  . Seizures     "I've had them all my life; last one was 04/2008" (12/26/2012)  . Chronic lower back pain   . Depression     "used to have problems w/depression" (12/26/2012)  . Fall at home 12/25/2012    "stumbled over cord while I was blowing leaves; broke my right hip" (12/26/2012)  . CVA (cerebral infarction) 01/07/2013    Acute right brain stroke  . Fracture of femoral neck, right 01/03/2013  . Diabetes mellitus type II     INSULIN DEPENDENT  . Arthritis   . Anemia   . Hyperkalemia 02/24/2014   Past Surgical History  Procedure Laterality Date  . Coronary artery bypass graft  04/2008    "CABG X3" (12/26/2012)  . Cataract extraction w/ intraocular lens  implant, bilateral Bilateral ~2004  . Hip arthroplasty Right 12/26/2012    Procedure: Right Hip  Hemiarthroplasty;  Surgeon: [name], MD;  Location: [place];  Service: Orthopedics;  Laterality: Right;  . Inguinal hernia repair Right    Family History  Problem Relation Age of Onset  . Coronary artery disease    . Hypertension     History  Substance Use Topics  . Smoking status: Former Research scientist (life sciences)  . Smokeless tobacco: Former User    Types: Chew     Comment: 02/13/2013 "smoked a little in my teens; quit chewing ~ 2008"  . Alcohol Use: 0.0 oz/week     Comment: 12/30//2014 "used to drink; never had problems w/it; quit > 30 yrs ago"    Review of Systems  Constitutional: Negative for fever, activity change, appetite change and fatigue.  HENT: Negative for congestion, facial swelling, rhinorrhea and trouble swallowing.   Eyes: Negative for photophobia and pain.  Respiratory: Negative for cough, chest tightness and shortness of breath.   Cardiovascular: Negative for chest pain and leg swelling.  Gastrointestinal: Negative for nausea, vomiting, abdominal pain, diarrhea and constipation.  Endocrine: Negative for polydipsia and polyuria.  Genitourinary: Negative for dysuria, urgency, decreased urine volume and difficulty urinating.  Musculoskeletal: Negative for back pain and gait problem.  Skin: Negative for color change, rash and wound.  Allergic/Immunologic: Negative for immunocompromised state.  Neurological: Negative for dizziness, facial asymmetry, speech difficulty, weakness, numbness and headaches.  Psychiatric/Behavioral: Negative for confusion, decreased concentration  and agitation.      Allergies  Review of patient's allergies indicates no known allergies.  Home Medications   Prior to Admission medications   Medication Sig Start Date End Date Taking? Authorizing Provider  aspirin 81 MG chewable tablet Chew 81 mg by mouth daily.   Yes Historical Provider, MD  carbamazepine (EPITOL) 200 MG tablet Take 400 mg by mouth at bedtime.   Yes Historical Provider, MD    carvedilol (COREG) 25 MG tablet Take 25 mg by mouth 2 (two) times daily with a meal.    Yes Historical Provider, MD  clopidogrel (PLAVIX) 75 MG tablet Take 1 tablet (75 mg total) by mouth daily with breakfast. 05/20/12  Yes Belkys A Regalado, MD  Cyanocobalamin (B-12) 1000 MCG CAPS Take 1 capsule by mouth daily.   Yes Historical Provider, MD  DULoxetine (CYMBALTA) 30 MG capsule Take 30 mg by mouth 2 (two) times daily.   Yes Historical Provider, MD  Exenatide ER 2 MG PEN Inject 2 mg into the skin once a week. On Monday   Yes Historical Provider, MD  HYDROcodone-acetaminophen (NORCO/VICODIN) 5-325 MG per tablet Take 1 tablet by mouth 4 (four) times daily as needed for moderate pain.   Yes Historical Provider, MD  insulin glargine (LANTUS) 100 units/mL SOLN Inject 30 Units into the skin at bedtime.    Yes Historical Provider, MD  levETIRAcetam (KEPPRA) 500 MG tablet Take 1 tablet (500 mg total) by mouth 2 (two) times daily. 02/15/13  Yes Donne Hazel, MD  meloxicam (MOBIC) 7.5 MG tablet Take 7.5 mg by mouth daily.   Yes Historical Provider, MD  mirtazapine (REMERON) 15 MG tablet Take 1 tablet (15 mg total) by mouth at bedtime. 01/10/13  Yes Ripudeep Krystal Eaton, MD  pantoprazole (PROTONIX) 40 MG tablet Take 40 mg by mouth daily.    Yes Historical Provider, MD  PREDNISONE, PAK, PO Take 1 tablet by mouth See admin instructions. Prednisone 10mg  . Take 3 tablets by mouth daily for 3 days, then take 2 tablets daily for 3 days, then take one tablet daily for 3 days. Family states he is on his last course of this medication.   Yes Historical Provider, MD  simvastatin (ZOCOR) 40 MG tablet Take 40 mg by mouth at bedtime.    Yes Historical Provider, MD  Tamsulosin HCl (FLOMAX) 0.4 MG CAPS Take 0.4 mg by mouth at bedtime.    Yes Historical Provider, MD  traMADol (ULTRAM) 50 MG tablet Take 50 mg by mouth 2 (two) times daily as needed for moderate pain.   Yes Historical Provider, MD   BP 172/62 mmHg  Pulse 80  Temp(Src)  98.2 F (36.8 C) (Oral)  Resp 18  Ht 5\' 11"  (1.803 m)  Wt 180 lb 1.9 oz (81.7 kg)  BMI 25.13 kg/m2  SpO2 93% Physical Exam  Constitutional: He is oriented to person, place, and time. He appears well-developed and well-nourished. No distress.  HENT:  Head: Normocephalic and atraumatic.  Mouth/Throat: No oropharyngeal exudate.  Eyes: Pupils are equal, round, and reactive to light.  Neck: Normal range of motion. Neck supple.  Cardiovascular: Normal rate, regular rhythm and normal heart sounds.  Exam reveals no gallop and no friction rub.   No murmur heard. Pulmonary/Chest: Effort normal and breath sounds normal. No respiratory distress. He has no wheezes. He has no rales.  Abdominal: Soft. Bowel sounds are normal. He exhibits no distension and no mass. There is no tenderness. There is no rebound and no  guarding.  Musculoskeletal: Normal range of motion. He exhibits no edema or tenderness.       Back:  Neurological: He is alert and oriented to person, place, and time. No cranial nerve deficit or sensory deficit. He exhibits abnormal muscle tone. Gait abnormal. GCS eye subscore is 4. GCS verbal subscore is 5. GCS motor subscore is 6.  Reflex Scores:      Patellar reflexes are 2+ on the right side and 2+ on the left side. 4/5 strength BLLE  Skin: Skin is warm and dry.  Psychiatric: He has a normal mood and affect.    ED Course  Procedures (including critical care time) Labs Review Labs Reviewed  CBC WITH DIFFERENTIAL - Abnormal; Notable for the following:    WBC 20.2 (*)    RBC 2.65 (*)    Hemoglobin 7.7 (*)    HCT 23.8 (*)    Eosinophils Relative 6 (*)    Neutro Abs 14.6 (*)    Monocytes Absolute 1.8 (*)    Eosinophils Absolute 1.2 (*)    Basophils Absolute 0.2 (*)    All other components within normal limits  BASIC METABOLIC PANEL - Abnormal; Notable for the following:    Sodium 133 (*)    Potassium 5.7 (*)    Glucose, Bld 411 (*)    BUN 57 (*)    Creatinine, Ser 1.58 (*)     GFR calc non Af Amer 39 (*)    GFR calc Af Amer 45 (*)    All other components within normal limits  URINALYSIS, ROUTINE W REFLEX MICROSCOPIC - Abnormal; Notable for the following:    Color, Urine AMBER (*)    Glucose, UA 100 (*)    All other components within normal limits  BRAIN NATRIURETIC PEPTIDE - Abnormal; Notable for the following:    B Natriuretic Peptide 352.1 (*)    All other components within normal limits  PHENYTOIN LEVEL, TOTAL - Abnormal; Notable for the following:    Phenytoin Lvl <2.5 (*)    All other components within normal limits  VITAMIN B12 - Abnormal; Notable for the following:    Vitamin B-12 1584 (*)    All other components within normal limits  IRON AND TIBC - Abnormal; Notable for the following:    Iron 36 (*)    TIBC 139 (*)    UIBC 103 (*)    All other components within normal limits  FERRITIN - Abnormal; Notable for the following:    Ferritin 1243 (*)    All other components within normal limits  RETICULOCYTES - Abnormal; Notable for the following:    RBC. 3.22 (*)    All other components within normal limits  BASIC METABOLIC PANEL - Abnormal; Notable for the following:    Sodium 134 (*)    Potassium 5.6 (*)    Glucose, Bld 394 (*)    BUN 54 (*)    Creatinine, Ser 1.36 (*)    GFR calc non Af Amer 46 (*)    GFR calc Af Amer 54 (*)    All other components within normal limits  GLUCOSE, CAPILLARY - Abnormal; Notable for the following:    Glucose-Capillary 353 (*)    All other components within normal limits  CBC - Abnormal; Notable for the following:    WBC 18.2 (*)    RBC 3.85 (*)    Hemoglobin 10.9 (*)    HCT 33.6 (*)    All other components within normal limits  BASIC METABOLIC  PANEL - Abnormal; Notable for the following:    Sodium 134 (*)    CO2 17 (*)    Glucose, Bld 259 (*)    BUN 53 (*)    GFR calc non Af Amer 47 (*)    GFR calc Af Amer 55 (*)    All other components within normal limits  GLUCOSE, CAPILLARY - Abnormal; Notable  for the following:    Glucose-Capillary 327 (*)    All other components within normal limits  GLUCOSE, CAPILLARY - Abnormal; Notable for the following:    Glucose-Capillary 238 (*)    All other components within normal limits  GLUCOSE, CAPILLARY - Abnormal; Notable for the following:    Glucose-Capillary 270 (*)    All other components within normal limits  CBG MONITORING, ED - Abnormal; Notable for the following:    Glucose-Capillary 399 (*)    All other components within normal limits  CBG MONITORING, ED - Abnormal; Notable for the following:    Glucose-Capillary 397 (*)    All other components within normal limits  URINE CULTURE  FOLATE  OCCULT BLOOD X 1 CARD TO LAB, STOOL  POC OCCULT BLOOD, ED  PREPARE RBC (CROSSMATCH)  TYPE AND SCREEN    Imaging Review Dg Abd Acute W/chest  03/17/2014   CLINICAL DATA:  Shortness of breath, constipation  EXAM: ACUTE ABDOMEN SERIES (ABDOMEN 2 VIEW & CHEST 1 VIEW)  COMPARISON:  .  Chest x-ray dated February 18, 2014  FINDINGS: The lungs are reasonably well inflated. The interstitial markings are more conspicuous bilaterally. The hemidiaphragms are less well demonstrated. The cardiac silhouette is mildly enlarged though stable. The pulmonary vascularity is indistinct.  Within the abdomen there is increased stool burden throughout the colon. There is no large or small bowel obstructive pattern. No free extraluminal gas collections are demonstrated.  IMPRESSION: 1. Increased stool burden throughout the colon is consistent with clinical constipation. There is no evidence of obstruction. 2. Worsening appearance of the pulmonary interstitium is consistent with interstitial edema likely secondary to CHF. There has been interval development of small bilateral pleural effusions and minimal bibasilar atelectasis.   Electronically Signed   By: David  Martinique   On: 03/14/2014 12:47     EKG Interpretation   Date/Time:  Monday February 25 2014 10:32:57  EST Ventricular Rate:  80 PR Interval:  232 QRS Duration: 119 QT Interval:  391 QTC Calculation: 451 R Axis:   28 Text Interpretation:  Sinus rhythm Atrial premature complex Prolonged PR  interval Nonspecific intraventricular conduction delay Anterior infarct,  old Baseline wander in lead(s) V1 V2 Confirmed by DOCHERTY  MD, MEGAN  [phone]) on 03/11/2014 12:39:56 PM      MDM   Final diagnoses:  Weakness  Constipation  Hyperkalemia  Leukocytosis  Anemia, unspecified anemia type  Midline low back pain with sciatica, sciatica laterality unspecified  Acute congestive heart failure, unspecified congestive heart failure type  Acute renal failure, unspecified acute renal failure type    Pt is a 81 y.o. male with Pmhx as above who presents with multiple complaints form home including anorexia, generalized weakness, constipation, and chronic low back pain. On PE, Pt pale, chronically ill appearing. 4/5 strength in BLLE, with nml sensation & reflexes. Pt can stand, but cannot ambulate due to weakness & back pain. W/u significant for significant drop in Hb since visit on 1/4, with negative stool hemoccult, worsening ARF now w/ hyperkalemia, and WBC of 20 w/o known cause. [facility] consulted for admission.  Ernestina Patches, MD 02/26/14 [phone]

## 2014-02-26 NOTE — Progress Notes (Signed)
Daughter at bedside. Would like MD to give her a call when they can.  [name], [name] Daughter   [phone]

## 2014-02-26 NOTE — Progress Notes (Addendum)
Patient found at the bedside on his knees after bed alarm activated. Fall safety huddle and zone completed. Floor coverage notified about fall and BP. No acute injury to patient. No new orders. Will continue to monitor per staff.

## 2014-02-26 NOTE — Progress Notes (Signed)
PROGRESS NOTE  [name] JHE:[id number] DOB: 1928/08/11 DOA: 02/27/2014 PCP: Woody Seller, MD  HPI/Recap of past 24 hours: Poor historian, not able to provide much history, audible wheezing during encounter. C/o chronic back pain. Oriented to place and person, not to time per daughter this is baseline.   Patient's daughter reported patient has been getting progressively weak, from being able to walk a few months ago, progressed to needing a cane, then a walker, then became wheelchair dependent recently. patient has progressive back pain was scheduled to be seen by orthopedics to discuss local injection. However, patient was found more confused and was sent to the ED.  Assessment/Plan: Principal Problem:   Hyperkalemia Active Problems:   Convulsions   Type 2 diabetes mellitus with hyperosmolar nonketotic hyperglycemia   Acute renal failure   Constipation   Lumbago  Wheezing: family reported this is new, no known h/o copd/asthma. duoneb for now. Concerning chest findings on ab xray, will get ct chest/echo to further eval.  Left lower extremity edema: venous ultrasound pending to r/o DVT.  Acute on chronic renal failure, stage III; -improved with hydration, ua no infection but is very concentrated with specific gravity 1.025. insetting of dehydration, poor oral intake.  -hydration, monitor for volume overload.   Hyperkalemia;  -resolved -received IV fluids, calcium gluconate.   Normocytic Anemia:  -stool occult negative per ED. -no sign of hemolysis --s/p prbcx1.  Hold aspirin , plavix for now.   Constipation; in setting of opioids.  resolved. fleet enema, start docusate.   Back pain, weakness, inability to ambulate, lower extremities numbness.\Will order MRI lumbar spine. PT consult. Continue with prednisone for now. Need snf placement  Leukocytosis; probably related to prednisone and dehydration.  No fever, no obvious source of infection, not started on  abx.  H/o Seizure: continue with Keppra and Carbamazepine.   Diabetes; uncontrolled. Continue with lantus, SSI.   Depression: continue with Cymbalta, started last week. Daughter reported worsening of depression recently, patient denies SI/HI.  Code Status: full , confirmed with family and patient  Family Communication: daughter and sister  Disposition Plan: remain in patient, snf at discharge   Consultants:  none  Procedures:  none  Antibiotics:  none   Objective: BP 131/47 mmHg  Pulse 80  Temp(Src) 98 F (36.7 C) (Oral)  Resp 18  Ht 5\' 11"  (1.803 m)  Wt 81.7 kg (180 lb 1.9 oz)  BMI 25.13 kg/m2  SpO2 97%  Intake/Output Summary (Last 24 hours) at 02/26/14 1601 Last data filed at 02/26/14 1030  Gross per 24 hour  Intake    695 ml  Output    400 ml  Net    295 ml   Filed Weights   02/28/2014 1034 03/10/2014 1504  Weight: 80.74 kg (178 lb) 81.7 kg (180 lb 1.9 oz)    Exam:   General:  Frail/chronicaly ill apprearing  Cardiovascular: rrr  Respiratory: diffuse wheezing bilaterally  Abdomen: soft/NT/ND/positive bs  Musculoskeletal: right lower extremity pitting edema, not able to lift legs against gravity  Neuro: confused.   Data Reviewed: Basic Metabolic Panel:  Recent Labs Lab 03/07/2014 1052 03/04/2014 1605 02/26/14 0739  NA 133* 134* 134*  K 5.7* 5.6* 4.9  CL 104 107 110  CO2 21 19 17*  GLUCOSE 411* 394* 259*  BUN 57* 54* 53*  CREATININE 1.58* 1.36* 1.34  CALCIUM 8.9 9.0 8.4   Liver Function Tests: No results for input(s): AST, ALT, ALKPHOS, BILITOT, PROT, ALBUMIN in the last 168  hours. No results for input(s): LIPASE, AMYLASE in the last 168 hours. No results for input(s): AMMONIA in the last 168 hours. CBC:  Recent Labs Lab 03/06/2014 1052 02/26/14 0739  WBC 20.2* 18.2*  NEUTROABS 14.6*  --   HGB 7.7* 10.9*  HCT 23.8* 33.6*  MCV 89.8 87.3  PLT 361 262   Cardiac Enzymes:   No results for input(s): CKTOTAL, CKMB, CKMBINDEX,  TROPONINI in the last 168 hours. BNP (last 3 results) No results for input(s): PROBNP in the last 8760 hours. CBG:  Recent Labs Lab 03/09/2014 1543 02/27/2014 1717 03/08/2014 2237 02/26/14 0809 02/26/14 1243  GLUCAP 353* 327* 238* 270* 213*    Recent Results (from the past 240 hour(s))  Urine culture     Status: None   Collection Time: 03/16/2014 12:44 PM  Result Value Ref Range Status   Specimen Description URINE, CATHETERIZED  Final   Special Requests NONE  Final   Colony Count NO GROWTH Performed at [facility]   Final   Culture NO GROWTH Performed at [facility]   Final   Report Status 02/26/2014 FINAL  Final     Studies: Dg Abd Acute W/chest  03/01/2014   CLINICAL DATA:  Shortness of breath, constipation  EXAM: ACUTE ABDOMEN SERIES (ABDOMEN 2 VIEW & CHEST 1 VIEW)  COMPARISON:  .  Chest x-ray dated February 18, 2014  FINDINGS: The lungs are reasonably well inflated. The interstitial markings are more conspicuous bilaterally. The hemidiaphragms are less well demonstrated. The cardiac silhouette is mildly enlarged though stable. The pulmonary vascularity is indistinct.  Within the abdomen there is increased stool burden throughout the colon. There is no large or small bowel obstructive pattern. No free extraluminal gas collections are demonstrated.  IMPRESSION: 1. Increased stool burden throughout the colon is consistent with clinical constipation. There is no evidence of obstruction. 2. Worsening appearance of the pulmonary interstitium is consistent with interstitial edema likely secondary to CHF. There has been interval development of small bilateral pleural effusions and minimal bibasilar atelectasis.   Electronically Signed   By: David  Martinique   On: 03/04/2014 12:47    Scheduled Meds: . antiseptic oral rinse  7 mL Mouth Rinse BID  . carbamazepine  400 mg Oral QHS  . carvedilol  25 mg Oral BID WC  . docusate sodium  100 mg Oral BID  . DULoxetine  30 mg Oral Daily   . feeding supplement (GLUCERNA SHAKE)  237 mL Oral TID BM  . insulin aspart  0-9 Units Subcutaneous TID WC  . insulin glargine  15 Units Subcutaneous QHS  . ipratropium-albuterol  3 mL Nebulization Q4H  . levETIRAcetam  500 mg Oral BID  . mirtazapine  15 mg Oral QHS  . pantoprazole  40 mg Oral Daily  . polyethylene glycol  17 g Oral Daily  . simvastatin  40 mg Oral QHS  . tamsulosin  0.4 mg Oral QHS  . vitamin B-12  1,000 mcg Oral Daily    Continuous Infusions: . sodium chloride         Justin Buechner  [facility] Pager [phone]. If 7PM-7AM, please contact night-coverage at [email or web address], password [email or web address] 02/26/2014, 4:01 PM  LOS: 1 day

## 2014-02-26 NOTE — Progress Notes (Signed)
UR Completed.  [phone]  

## 2014-02-26 NOTE — Progress Notes (Signed)
Pts bed alarm going off. Pt found by Charge nurse on his knees facing the wall. Staff at bedside. Pt placed in bed. Vitals taken. Pts RN notified provider on call. Safety Huddle done. Will continue to monitor.

## 2014-02-26 NOTE — Clinical Social Work Placement (Signed)
Clinical Social Work Department CLINICAL SOCIAL WORK PLACEMENT NOTE 02/26/2014  Patient:  [name], [name]  Account Number:  [id number] Admit date:  03/15/2014  Clinical Social Worker:  Lovey Newcomer  Date/time:  02/26/2014 02:48 PM  Clinical Social Work is seeking post-discharge placement for this patient at the following level of care:   [place]   (*CSW will update this form in Epic as items are completed)   02/26/2014  Patient/family provided with [place] Department of Clinical Social Work's list of facilities offering this level of care within the geographic area requested by the patient (or if unable, by the patient's family).  02/26/2014  Patient/family informed of their freedom to choose among providers that offer the needed level of care, that participate in Medicare, Medicaid or managed care program needed by the patient, have an available bed and are willing to accept the patient.  02/26/2014  Patient/family informed of [facility]' ownership interest in [facility], as well as of the fact that they are under no obligation to receive care at this facility.  PASARR submitted to EDS on 02/26/2014 PASARR number received on 02/26/2014  FL2 transmitted to all facilities in geographic area requested by pt/family on  02/26/2014 FL2 transmitted to all facilities within larger geographic area on   Patient informed that his/her managed care company has contracts with or will negotiate with  certain facilities, including the following:     Patient/family informed of bed offers received:  02/26/2014 Patient chooses bed at [place] Physician recommends and patient chooses bed at    Patient to be transferred to  on   Patient to be transferred to facility by  Patient and family notified of transfer on  Name of family member notified:    The following physician request were entered in Epic:   Additional  Comments:      Liz Beach MSW, [place], [place], [phone]

## 2014-02-26 NOTE — Progress Notes (Signed)
INITIAL NUTRITION ASSESSMENT  DOCUMENTATION CODES Per approved criteria  -Not Applicable   INTERVENTION: - Glucerna Shake po TID, each supplement provides 220 kcal and 10 grams of protein - RD will continue to monitor  NUTRITION DIAGNOSIS: Inadequate oral intake related to poor appetite as evidenced by poor po.   Goal: Pt to meet >/= 90% of their estimated nutrition needs   Monitor:  Weight trend, po intake, acceptance of supplements, labs  Reason for Assessment: MST  81 y.o. male  Admitting Dx: Hyperkalemia  ASSESSMENT: 81 y.o. male with PMH significant for seizure disorder, CKD stage III, diabetes, CVA who presents with worsening back pain for last 2 moth, also inability to ambulate for last week. He has had decrease appetite, poor oral intake, no Bowel movement for last week. He was able to use a walker to ambulate, over last week he has been using a wheelchair to move around. He relates bilateral lower extremities numbness, and tingling.   - Pt's breakfast tray in room <5% po. He reports that his appetite has been poor. Pt suspects weight loss, but is unsure of amount. He says that he is not hungry for lunch. Pt says that he drinks two Boost supplements per day at home. Will order nutritional supplements while in the hospital. Glucerna most appropriate due to elevated blood glucose.  - Pt with moderate muscle wasting at the temples and mild wasting at the clavicle, other areas WNL.   Labs: CBGs: 213-353 Na low K WNL BUN elevated  Height: Ht Readings from Last 1 Encounters:  03/16/2014 5\' 11"  (1.803 m)    Weight: Wt Readings from Last 1 Encounters:  03/16/2014 180 lb 1.9 oz (81.7 kg)    Ideal Body Weight: 75.3 kg  % Ideal Body Weight: 108%  Wt Readings from Last 10 Encounters:  03/03/2014 180 lb 1.9 oz (81.7 kg)  02/18/14 180 lb (81.647 kg)  02/15/13 176 lb 11.2 oz (80.151 kg)  01/19/13 184 lb (83.462 kg)  01/10/13 201 lb 14.4 oz (91.581 kg)  01/03/13 198 lb  (89.812 kg)  12/28/12 199 lb (90.266 kg)  05/18/12 181 lb 14.1 oz (82.5 kg)  02/26/11 70 lb 8 oz (31.979 kg)  02/10/11 161 lb 11.2 oz (73.347 kg)    Usual Body Weight: 176-180 lbs  % Usual Body Weight: 100%  BMI:  Body mass index is 25.13 kg/(m^2).  Estimated Nutritional Needs: Kcal: 2100-2300 Protein: 115-125 g Fluid: 2.1-2.3 L/day  Skin: intact  Diet Order: Diet heart healthy/carb modified  EDUCATION NEEDS: -Education needs addressed   Intake/Output Summary (Last 24 hours) at 02/26/14 1335 Last data filed at 02/26/14 1030  Gross per 24 hour  Intake    695 ml  Output    400 ml  Net    295 ml    Last BM: 1/12   Labs:   Recent Labs Lab 03/01/2014 1052 02/22/2014 1605 02/26/14 0739  NA 133* 134* 134*  K 5.7* 5.6* 4.9  CL 104 107 110  CO2 21 19 17*  BUN 57* 54* 53*  CREATININE 1.58* 1.36* 1.34  CALCIUM 8.9 9.0 8.4  GLUCOSE 411* 394* 259*    CBG (last 3)   Recent Labs  03/15/2014 2237 02/26/14 0809 02/26/14 1243  GLUCAP 238* 270* 213*    Scheduled Meds: . antiseptic oral rinse  7 mL Mouth Rinse BID  . carbamazepine  400 mg Oral QHS  . carvedilol  25 mg Oral BID WC  . docusate sodium  100 mg Oral  BID  . DULoxetine  30 mg Oral Daily  . feeding supplement (ENSURE COMPLETE)  237 mL Oral BID BM  . insulin aspart  0-9 Units Subcutaneous TID WC  . insulin glargine  15 Units Subcutaneous QHS  . ipratropium-albuterol  3 mL Nebulization Q4H  . levETIRAcetam  500 mg Oral BID  . mirtazapine  15 mg Oral QHS  . pantoprazole  40 mg Oral Daily  . polyethylene glycol  17 g Oral Daily  . senna-docusate  2 tablet Oral BID  . simvastatin  40 mg Oral QHS  . tamsulosin  0.4 mg Oral QHS  . vitamin B-12  1,000 mcg Oral Daily    Continuous Infusions:   Past Medical History  Diagnosis Date  . CAD (coronary artery disease)     s/p cabg 3/10...s/p MI age 88  . HTN (hypertension)   . HLD (hyperlipidemia)   . Anxiety disorder   . Tremor   . GERD (gastroesophageal  reflux disease)   . Pulmonary vein stenosis   . Angina   . Chronic kidney disease   . Anxiety   . Myocardial infarction 1970's; 2010  . Aspiration pneumonia     History of aspiration pneumonia with vent-dependent respiratory/notes 06/12/2008 (12/26/2012)  . Orthopnoea     "just recently" (12/26/2012)  . History of stomach ulcers     "long time ago" (12/26/2012)  . Seizures     "I've had them all my life; last one was 04/2008" (12/26/2012)  . Chronic lower back pain   . Depression     "used to have problems w/depression" (12/26/2012)  . Fall at home 12/25/2012    "stumbled over cord while I was blowing leaves; broke my right hip" (12/26/2012)  . CVA (cerebral infarction) 01/07/2013    Acute right brain stroke  . Fracture of femoral neck, right 01/03/2013  . Diabetes mellitus type II     INSULIN DEPENDENT  . Arthritis   . Anemia   . Hyperkalemia 02/24/2014    Past Surgical History  Procedure Laterality Date  . Coronary artery bypass graft  04/2008    "CABG X3" (12/26/2012)  . Cataract extraction w/ intraocular lens  implant, bilateral Bilateral ~2004  . Hip arthroplasty Right 12/26/2012    Procedure: Right Hip Hemiarthroplasty;  Surgeon: [name], MD;  Location: [place];  Service: Orthopedics;  Laterality: Right;  . Inguinal hernia repair Right     Laurette Schimke MS, RD, LDN

## 2014-02-27 ENCOUNTER — Encounter (HOSPITAL_COMMUNITY): Payer: Self-pay | Admitting: Radiology

## 2014-02-27 ENCOUNTER — Inpatient Hospital Stay (HOSPITAL_COMMUNITY): Payer: Commercial Managed Care - HMO

## 2014-02-27 DIAGNOSIS — C799 Secondary malignant neoplasm of unspecified site: Secondary | ICD-10-CM

## 2014-02-27 DIAGNOSIS — R609 Edema, unspecified: Secondary | ICD-10-CM

## 2014-02-27 DIAGNOSIS — K5909 Other constipation: Secondary | ICD-10-CM

## 2014-02-27 DIAGNOSIS — I509 Heart failure, unspecified: Secondary | ICD-10-CM

## 2014-02-27 LAB — GLUCOSE, CAPILLARY
GLUCOSE-CAPILLARY: 139 mg/dL — AB (ref 70–99)
Glucose-Capillary: 180 mg/dL — ABNORMAL HIGH (ref 70–99)
Glucose-Capillary: 258 mg/dL — ABNORMAL HIGH (ref 70–99)
Glucose-Capillary: 192 mg/dL — ABNORMAL HIGH (ref 70–99)

## 2014-02-27 MED ORDER — IPRATROPIUM-ALBUTEROL 0.5-2.5 (3) MG/3ML IN SOLN
3.0000 mL | Freq: Four times a day (QID) | RESPIRATORY_TRACT | Status: DC
  Administered 2014-02-27 (×2): 3 mL via RESPIRATORY_TRACT
  Filled 2014-02-27 (×2): qty 3

## 2014-02-27 MED ORDER — IOHEXOL 300 MG/ML  SOLN
100.0000 mL | Freq: Once | INTRAMUSCULAR | Status: AC | PRN
  Administered 2014-02-27: 100 mL via INTRAVENOUS

## 2014-02-27 MED ORDER — SODIUM CHLORIDE 0.9 % IJ SOLN
10.0000 mL | INTRAMUSCULAR | Status: DC | PRN
  Administered 2014-02-28 – 2014-03-02 (×3): 10 mL
  Filled 2014-02-27 (×3): qty 40

## 2014-02-27 MED ORDER — IPRATROPIUM-ALBUTEROL 0.5-2.5 (3) MG/3ML IN SOLN
3.0000 mL | Freq: Two times a day (BID) | RESPIRATORY_TRACT | Status: DC
  Administered 2014-02-27: 3 mL via RESPIRATORY_TRACT
  Filled 2014-02-27 (×3): qty 3

## 2014-02-27 MED ORDER — IOHEXOL 300 MG/ML  SOLN
25.0000 mL | INTRAMUSCULAR | Status: AC
  Administered 2014-02-27 (×2): 25 mL via ORAL

## 2014-02-27 NOTE — Clinical Social Work Note (Signed)
[facility] has requested updated PT and OT notes. CSW has asked charge RN to assist with having PT and OT see patient ASAP.   Liz Beach MSW, [place], [place], [phone]

## 2014-02-27 NOTE — Progress Notes (Signed)
Inpatient Diabetes Program Recommendations  AACE/ADA: New Consensus Statement on Inpatient Glycemic Control (2013)  Target Ranges:  Prepandial:   less than 140 mg/dL      Peak postprandial:   less than 180 mg/dL (1-2 hours)      Critically ill patients:  140 - 180 mg/dL     Results for FAROOQ, PETROVICH (MRN [id number]) as of 02/27/2014 09:01  Ref. Range 02/26/2014 08:09 02/26/2014 12:43 02/26/2014 17:07 02/26/2014 21:56  Glucose-Capillary Latest Range: 70-99 mg/dL 270 (H) 213 (H) 200 (H) 190 (H)    Results for DOYT, CASTELLANA (MRN [id number]) as of 02/27/2014 09:01  Ref. Range 02/27/2014 07:45  Glucose-Capillary Latest Range: 70-99 mg/dL 180 (H)     Home DM Meds: Lantus 30 units QHS   Current Orders: Lantus 15 units QHS     Novolog Sensitive SSI   **Patient still having some glucose elevations.  Currently only receiving 50% of home dose of Lantus.  **Not eating well just yet.    MD- Please consider increasing Lantus dose to 75% of home dose- Lantus 22 units QHS    Will follow Wyn Quaker RN, MSN, CDE Diabetes Coordinator Inpatient Diabetes Program Team Pager: [phone] (8a-10p)

## 2014-02-27 NOTE — Progress Notes (Signed)
*  Preliminary Results* Right lower extremity venous duplex completed. Right lower extremity is negative for deep vein thrombosis. There is no evidence of right Baker's cyst.  02/27/2014 2:46 PM  Maudry Mayhew, RVT, RDCS, RDMS

## 2014-02-27 NOTE — Progress Notes (Signed)
Peripherally Inserted Central Catheter/Midline Placement  The IV Nurse has discussed with the patient and/or persons authorized to consent for the patient, the purpose of this procedure and the potential benefits and risks involved with this procedure.  The benefits include less needle sticks, lab draws from the catheter and patient may be discharged home with the catheter.  Risks include, but not limited to, infection, bleeding, blood clot (thrombus formation), and puncture of an artery; nerve damage and irregular heat beat.  Alternatives to this procedure were also discussed.  PICC/Midline Placement Documentation        [name] 02/27/2014, 10:40 AM Obtained phone consent from daughter, [name]

## 2014-02-27 NOTE — Progress Notes (Signed)
  Echocardiogram 2D Echocardiogram has been performed.  [name] 02/27/2014, 1:06 PM

## 2014-02-27 NOTE — Progress Notes (Signed)
Physical Therapy Treatment Patient Details Name: [name] MRN: [id number] DOB: 1928-03-14 Today's Date: 02/27/2014    History of Present Illness 81 y.o. male with PMH significant for seizure disorder, CKD stage III, diabetes, CVA who presents with worsening back pain for last 2 moth, also inability to ambulate for last week. He has had decrease appetite, poor oral intake, no Bowel movement for last week. He was able to use a walker to ambulate, over last week he has been using a wheelchair to move around. He relates bilateral lower extremities numbness, and tingling.    PT Comments    Progressing slowly.  Emphasis on bed mobility and sit to stand/standing tolerance.  Follow Up Recommendations  SNF;Supervision/Assistance - 24 hour     Equipment Recommendations  None recommended by PT    Recommendations for Other Services       Precautions / Restrictions Precautions Precautions: Fall Precaution Comments: Pt is serious fall risk and fell in hospital on day of admission. Restrictions Weight Bearing Restrictions: No    Mobility  Bed Mobility Overal bed mobility: Needs Assistance Bed Mobility: Sit to Sidelying;Supine to Sit Rolling: Mod assist   Supine to sit: Mod assist Sit to supine: Max assist;HOB elevated Sit to sidelying: Max assist    Transfers Overall transfer level: Needs assistance Equipment used: Rolling walker (2 wheeled) Transfers: Sit to/from Stand Sit to Stand: Max assist;+2 safety/equipment         General transfer comment: worked on best Administrator Rankin (Stroke Patients Only)       Balance Overall balance assessment: Needs assistance Sitting-balance support: No upper extremity supported;Feet supported Sitting balance-Leahy Scale: Fair     Standing balance support: Bilateral upper extremity supported Standing balance-Leahy Scale:  Zero Standing balance comment: worked on sit to stand and standing balance x2,  emphasis on knee control                    Cognition Arousal/Alertness: Awake/alert Behavior During Therapy: WFL for tasks assessed/performed Overall Cognitive Status: Impaired/Different from baseline Area of Impairment: Attention;Safety/judgement;Awareness;Problem solving;Following commands;Memory     Memory: Decreased recall of precautions;Decreased short-term memory Following Commands: Follows one step commands with increased time Safety/Judgement: Decreased awareness of safety   Problem Solving: Difficulty sequencing;Requires verbal cues;Slow processing      Exercises General Exercises - Upper Extremity Shoulder Flexion: AAROM;Both;10 reps;Seated    General Comments        Pertinent Vitals/Pain Pain Assessment: Faces Pain Score: 5  Faces Pain Scale: Hurts even more Pain Location: back Pain Descriptors / Indicators: Aching Pain Intervention(s): Limited activity within patient's tolerance;Repositioned    Home Living                      Prior Function            PT Goals (current goals can now be found in the care plan section) Acute Rehab PT Goals PT Goal Formulation: With patient Time For Goal Achievement: 03/12/14 Potential to Achieve Goals: Fair Progress towards PT goals: Progressing toward goals (slow progress)    Frequency  Min 3X/week    PT Plan Current plan remains appropriate    Co-evaluation             End of Session  Activity Tolerance: Patient limited by fatigue Patient left: in bed;with call bell/phone within reach;with bed alarm set     Time: 1701-1726 PT Time Calculation (min) (ACUTE ONLY): 25 min  Charges:  $Therapeutic Activity: 23-37 mins                    G Codes:      Dawayne Ohair, Tessie Fass 02/27/2014, 5:35 PM 02/27/2014  Donnella Sham, [place] [phone]  (pager)

## 2014-02-27 NOTE — Progress Notes (Signed)
PROGRESS NOTE  [name] FHL:[id number] DOB: 25-Nov-1928 DOA: 03/09/2014 PCP: Woody Seller, MD  HPI/Recap of past 24 hours: Poor historian, not able to provide much history, audible wheezing during encounter. C/o chronic back pain. Oriented to place and person, not to time per daughter this is baseline.   Patient's daughter reported patient has been getting progressively weak, from being able to walk a few months ago, progressed to needing a cane, then a walker, then became wheelchair dependent recently. patient has progressive back pain was scheduled to be seen by orthopedics to discuss local injection. However, patient was found more confused and was sent to the ED.  Assessment/Plan:  Metastatic cancer -has dyspnea and occassional wheezing CT chest reveals extensive metastasis have discussed with patient and family in detail.  CT of abd/pelvis reveals left renal mass, b/l adrenal masses and bony mets - palliative care consulted- not a good candidate for work up and chemo  Back pain - noted to have sclerotic lesions where he is tender on exam- will attempt pain control    Left lower extremity edema  venous ultrasound negative  Acute on chronic renal failure, stage III; -improved with hydration, ua no infection but is very concentrated with specific gravity 1.025. -hydration, monitor for volume overload.   Hyperkalemia;  -resolved -received IV fluids, calcium gluconate.   Normocytic Anemia:  -stool occult negative per ED. -no sign of hemolysis --s/p prbcx1.  Hold aspirin , plavix for now.   Constipation; in setting of opioids.  resolved. fleet enema, start docusate.   Leukocytosis; probably related to recent prednisone and dehydration.  No fever, no obvious source of infection, not started on abx.  H/o Seizure: continue with Keppra and Carbamazepine.   Diabetes; uncontrolled. Continue with lantus, SSI.   Depression: continue with Cymbalta, started last  week. Daughter reported worsening of depression recently, patient denies SI/HI.  Code Status: full , confirmed with family and patient  Family Communication: daughter and son  Disposition Plan: remain in patient, snf vs hospice home at discharge   Consultants:  none  Procedures:  none  Antibiotics:  none   Objective: BP 134/46 mmHg  Pulse 86  Temp(Src) 97.7 F (36.5 C) (Oral)  Resp 28  Ht 5\' 11"  (1.803 m)  Wt 83 kg (182 lb 15.7 oz)  BMI 25.53 kg/m2  SpO2 95%  Intake/Output Summary (Last 24 hours) at 02/27/14 1638 Last data filed at 02/27/14 0909  Gross per 24 hour  Intake      0 ml  Output    400 ml  Net   -400 ml   Filed Weights   02/16/2014 1034 02/23/2014 1504 02/27/14 0500  Weight: 80.74 kg (178 lb) 81.7 kg (180 lb 1.9 oz) 83 kg (182 lb 15.7 oz)    Exam:   General:  Frail/chronicaly ill apprearing  Cardiovascular: rrr  Respiratory: diffuse wheezing bilaterally  Abdomen: soft/NT/ND/positive bs  Musculoskeletal: tenderness in mid and lower back   Data Reviewed: Basic Metabolic Panel:  Recent Labs Lab 03/13/2014 1052 03/07/2014 1605 02/26/14 0739  NA 133* 134* 134*  K 5.7* 5.6* 4.9  CL 104 107 110  CO2 21 19 17*  GLUCOSE 411* 394* 259*  BUN 57* 54* 53*  CREATININE 1.58* 1.36* 1.34  CALCIUM 8.9 9.0 8.4   Liver Function Tests:  Recent Labs Lab 02/26/14 1630  AST 16  ALT 12  ALKPHOS 129*  BILITOT 0.6  PROT 5.6*  ALBUMIN 1.8*   No results for input(s): LIPASE, AMYLASE  in the last 168 hours. No results for input(s): AMMONIA in the last 168 hours. CBC:  Recent Labs Lab 02/19/2014 1052 02/26/14 0739  WBC 20.2* 18.2*  NEUTROABS 14.6*  --   HGB 7.7* 10.9*  HCT 23.8* 33.6*  MCV 89.8 87.3  PLT 361 262   Cardiac Enzymes:   No results for input(s): CKTOTAL, CKMB, CKMBINDEX, TROPONINI in the last 168 hours. BNP (last 3 results) No results for input(s): PROBNP in the last 8760 hours. CBG:  Recent Labs Lab 02/26/14 1243  02/26/14 1707 02/26/14 2156 02/27/14 0745 02/27/14 1209  GLUCAP 213* 200* 190* 180* 258*    Recent Results (from the past 240 hour(s))  Urine culture     Status: None   Collection Time: 03/01/2014 12:44 PM  Result Value Ref Range Status   Specimen Description URINE, CATHETERIZED  Final   Special Requests NONE  Final   Colony Count NO GROWTH Performed at [facility]   Final   Culture NO GROWTH Performed at [facility]   Final   Report Status 02/26/2014 FINAL  Final     Studies: Ct Chest Wo Contrast  02/26/2014   CLINICAL DATA:  Wheezing.  Cough.  Jaundice.  EXAM: CT CHEST WITHOUT CONTRAST  TECHNIQUE: Multidetector CT imaging of the chest was performed following the standard protocol without IV contrast.  COMPARISON:  Chest radiograph, 03/09/2014.  FINDINGS: There are numerous bilateral pulmonary nodules. Largest nodule lies at the diaphragmatic surface of the right lower lobe adjacent to the oblique fissure measuring 3 cm. Another dominant nodule lies along the oblique fissure at the level of the right pulmonary artery measuring 2 cm x 1.6 cm. There also numerous pleural-based nodules.  Small bilateral pleural effusions. There is associated dependent passive atelectasis, left greater than right. Interstitial thickening is noted most evident in the lower lungs. No evidence of pneumonia.  There are changes from previous CABG surgery. Heart is normal in size and configuration. There are moderate coronary artery calcifications. There are scattered prominent mediastinal lymph nodes. Largest is an 11 mm short axis subcarinal node. No hilar masses or pathologically enlarged lymph nodes. No enlarged neck base or axillary lymph nodes.  Limited evaluation of the upper abdomen shows a right adrenal mass measuring 34 mm x 21 mm. Haustral units average 19. This is nonspecific. Given the findings in the lungs, metastatic disease is suspected. There is mild thickening of the left adrenal  gland, incompletely imaged. 14 mm low-density lesion lies at the dome of the medial segment of the left liver lobe. There is also 12 mm low-density lesion at the dome of the anterior segment of the right lobe of the liver. These may reflect cysts. No other visible liver lesions.  There are multiple subtle lucent bone lesions interspersed with areas of irregular sclerosis. This is most evident in the T12 vertebrae. A small lesion is noted in the right scapula.  IMPRESSION: 1. Numerous pulmonary nodules consistent with widespread metastatic disease to the lungs. 2. Small bilateral effusions. There is also interstitial thickening. Superimposed interstitial pulmonary edema is felt likely. 3. 3.4 cm right adrenal mass. Metastatic disease is suspected. Thickening of the left adrenal gland may also be metastatic disease. 4. Primarily osteolytic subtle bone lesions most evident and T12 consistent with bony metastatic disease. 5. Two small low-density liver lesions, although nonspecific, are likely cysts.   Electronically Signed   By: Lajean Manes M.D.   On: 02/26/2014 18:58    Scheduled Meds: . antiseptic  oral rinse  7 mL Mouth Rinse BID  . carbamazepine  400 mg Oral QHS  . carvedilol  25 mg Oral BID WC  . docusate sodium  100 mg Oral BID  . DULoxetine  30 mg Oral Daily  . feeding supplement (GLUCERNA SHAKE)  237 mL Oral TID BM  . insulin aspart  0-9 Units Subcutaneous TID WC  . insulin glargine  15 Units Subcutaneous QHS  . ipratropium-albuterol  3 mL Nebulization BID  . levETIRAcetam  500 mg Oral BID  . mirtazapine  15 mg Oral QHS  . pantoprazole  40 mg Oral Daily  . polyethylene glycol  17 g Oral Daily  . simvastatin  40 mg Oral QHS  . tamsulosin  0.4 mg Oral QHS  . vitamin B-12  1,000 mcg Oral Daily    Continuous Infusions: . sodium chloride 1,000 mL (02/27/14 1106)       [facility]  [facility] Pager-  [email or web address], password [facility] 02/27/2014, 4:38 PM  LOS: 2 days

## 2014-02-27 NOTE — Progress Notes (Signed)
Occupational Therapy Treatment Patient Details Name: [name] MRN: [id number] DOB: May 01, 1928 Today's Date: 02/27/2014    History of present illness 81 y.o. male with PMH significant for seizure disorder, CKD stage III, diabetes, CVA who presents with worsening back pain for last 2 moth, also inability to ambulate for last week. He has had decrease appetite, poor oral intake, no Bowel movement for last week. He was able to use a walker to ambulate, over last week he has been using a wheelchair to move around. He relates bilateral lower extremities numbness, and tingling.   OT comments  Patient received supine in bed asleep. Patient's family at bedside. Patient easy to awake and arouse. Therapist engaged patient in bed mobility, patient required mod assist for rolling and max assist to perform supine<>sit. While seated EOB, focused on dynamic sitting balance/tolerance/endurance. Patient engaged in BUE strengthening shoulder flexion exercises. Patient with decreased overall activity tolerance/endurance and decreased sitting balance during activity. At end of session, therapist positioned patient to chair like position with bed alarm set, all needs within reach, and sister present at bedside. Plan of care remains appropriate. Patient will benefit from acute OT and SNF once discharged to improve patient's overall independence and quality of life.    Follow Up Recommendations  SNF;Supervision/Assistance - 24 hour    Equipment Recommendations   (defer to next venue)    Recommendations for Other Services  None at this time    Precautions / Restrictions Precautions Precautions: Fall Precaution Comments: Pt is serious fall risk and fell in hospital on day of admission. Restrictions Weight Bearing Restrictions: No       Mobility Bed Mobility Overal bed mobility: Needs Assistance Bed Mobility: Sit to Supine Rolling: Mod assist   Supine to sit: Max assist;HOB elevated Sit to supine:  Max assist;HOB elevated       Balance Overall balance assessment: Needs assistance Sitting-balance support: Feet supported Sitting balance-Leahy Scale: Fair                    Cognition   Behavior During Therapy: Flat affect Overall Cognitive Status: Impaired/Different from baseline Area of Impairment: Attention;Safety/judgement;Awareness;Problem solving;Following commands;Memory     Memory: Decreased recall of precautions;Decreased short-term memory  Following Commands: Follows one step commands with increased time Safety/Judgement: Decreased awareness of safety   Problem Solving: Difficulty sequencing;Requires verbal cues;Slow processing        Exercises General Exercises - Upper Extremity Shoulder Flexion: AAROM;Both;10 reps;Seated           Pertinent Vitals/ Pain       Pain Assessment: Faces Pain Score: 5  Pain Location: back Pain Intervention(s): Monitored during session         Frequency Min 2X/week     Progress Toward Goals  OT Goals(current goals can now be found in the care plan section)  Progress towards OT goals: Progressing toward goals     Plan Discharge plan remains appropriate       End of Session    Activity Tolerance Patient tolerated treatment well   Patient Left in bed;with call bell/phone within reach;with bed alarm set;with family/visitor present     Time: 6160-7371 OT Time Calculation (min): 25 min  Charges: OT General Charges $OT Visit: 1 Procedure OT Treatments $Therapeutic Activity: 23-37 mins  Angeleena Dueitt , MS, OTR/L, CLT Pager: [phone]  02/27/2014, 4:37 PM

## 2014-02-28 DIAGNOSIS — M549 Dorsalgia, unspecified: Secondary | ICD-10-CM | POA: Diagnosis present

## 2014-02-28 DIAGNOSIS — Z515 Encounter for palliative care: Secondary | ICD-10-CM

## 2014-02-28 DIAGNOSIS — C799 Secondary malignant neoplasm of unspecified site: Secondary | ICD-10-CM

## 2014-02-28 LAB — GLUCOSE, CAPILLARY
GLUCOSE-CAPILLARY: 150 mg/dL — AB (ref 70–99)
GLUCOSE-CAPILLARY: 213 mg/dL — AB (ref 70–99)
Glucose-Capillary: 185 mg/dL — ABNORMAL HIGH (ref 70–99)
Glucose-Capillary: 291 mg/dL — ABNORMAL HIGH (ref 70–99)

## 2014-02-28 LAB — BASIC METABOLIC PANEL
Anion gap: 7 (ref 5–15)
BUN: 56 mg/dL — ABNORMAL HIGH (ref 6–23)
CO2: 25 mmol/L (ref 19–32)
Calcium: 8.7 mg/dL (ref 8.4–10.5)
Chloride: 107 mEq/L (ref 96–112)
Creatinine, Ser: 1.33 mg/dL (ref 0.50–1.35)
GFR calc Af Amer: 55 mL/min — ABNORMAL LOW (ref >90)
GFR calc non Af Amer: 48 mL/min — ABNORMAL LOW (ref >90)
Glucose, Bld: 213 mg/dL — ABNORMAL HIGH (ref 70–99)
Potassium: 4.7 mmol/L (ref 3.5–5.1)
SODIUM: 139 mmol/L (ref 135–145)

## 2014-02-28 LAB — CBC
HCT: 31.1 % — ABNORMAL LOW (ref 39.0–52.0)
HEMOGLOBIN: 9.9 g/dL — AB (ref 13.0–17.0)
MCH: 28.4 pg (ref 26.0–34.0)
MCHC: 31.8 g/dL (ref 30.0–36.0)
MCV: 89.1 fL (ref 78.0–100.0)
PLATELETS: 195 10*3/uL (ref 150–400)
RBC: 3.49 MIL/uL — AB (ref 4.22–5.81)
RDW: 14.9 % (ref 11.5–15.5)
WBC: 13.1 10*3/uL — ABNORMAL HIGH (ref 4.0–10.5)

## 2014-02-28 MED ORDER — BISACODYL 10 MG RE SUPP: 10.0000 mg | Freq: Every day | RECTAL | Status: DC | PRN

## 2014-02-28 MED ORDER — OXYCODONE-ACETAMINOPHEN 5-325 MG PO TABS
2.0000 | ORAL_TABLET | ORAL | Status: DC | PRN
  Administered 2014-03-01: 2 via ORAL
  Filled 2014-02-28: qty 2

## 2014-02-28 MED ORDER — IPRATROPIUM-ALBUTEROL 0.5-2.5 (3) MG/3ML IN SOLN: 3.0000 mL | RESPIRATORY_TRACT | Status: DC | PRN

## 2014-02-28 MED ORDER — SORBITOL 70 % SOLN
30.0000 mL | Freq: Once | Status: AC
  Administered 2014-02-28: 30 mL via ORAL
  Filled 2014-02-28: qty 30

## 2014-02-28 MED ORDER — INSULIN GLARGINE 100 UNIT/ML ~~LOC~~ SOLN
30.0000 [IU] | Freq: Every day | SUBCUTANEOUS | Status: DC
  Filled 2014-02-28: qty 0.3

## 2014-02-28 MED ORDER — MORPHINE SULFATE (CONCENTRATE) 10 MG/0.5ML PO SOLN
5.0000 mg | ORAL | Status: DC | PRN
  Administered 2014-03-01 – 2014-03-02 (×4): 5 mg via ORAL
  Filled 2014-02-28 (×4): qty 0.5

## 2014-02-28 NOTE — Consult Note (Signed)
Patient [name]      DOB: [birth year]      QIH:[id number]     Consult Note from the Palliative Medicine Team at [facility] Requested by: Dr. Wynelle Cleveland     PCP: Woody Seller, MD Reason for Consultation: [place]     Phone Number:[phone]  Assessment of patients Current state: I met today with [name] (he did not participate in all the conversation and is somewhat confused), his daughter Katharine Look), sister, and 2 brothers. We discussed SNF rehab vs hospice care. They are very realistic and Katharine Look tells me she has seen major changes over the past few weeks and even over the past few days. His ability to participate in therapy is very slim and they understand he will likely only get weaker. They feel that at this point hospice will be a more appropriate plan for him. Katharine Look wishes she could take him home but that is not possible. He is eating very little (refused lunch completely and ate very little at breakfast) and Katharine Look says one day she got him to eat half a hot dog with much urging. 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 They all wish for comfort and peace for [name] as he passes from this world. I agree with his quick decline that prognosis is poor and likely only a couple weeks.    Goals of Care: 1.  Code Status: DNR - confirmed with family.    2. Scope of Treatment: Comfort   4. Disposition: Hopeful for hospice facility.    3. Symptom Management:   1. Pain/dyspnea: He complains of bilateral leg pain and back pain (likely r/t spinal mets). Continue Percocet but increase to 2 tablets as 1 tablet did not provide any relief according to Mr. Stettler. Roxanol may be used for severe pain and dyspnea and if he is unable to take po. Dosages may be adjusted to achieve comfort.   2. Bowel Regimen/Constipation: Abdomen soft but distended and he says he needs to have a BM. Sorbitol x 1. Continue colace and miralax as able to tolerate. Dulcolax supp daily prn.   4. Psychosocial: Emotional support provided to patient and to family at bedside.   5. Spiritual: Very spiritual and religious family in which they find much support.    Brief HPI: 81 yo male admitted with worsening weakness and back pain for last 2 months, also inability to ambulate for last week. He has had decrease appetite, poor oral intake, no Bowel movement for last week. He was able to use a walker to ambulate, over last week he has been using a wheelchair to move around. He relates bilateral lower extremities numbness, and tingling. No dyspnea, no chest pain, no abdominal pain, no nausea or vomiting. No history of melena or hematemesis.  He was seen in the ED last Monday with back pain, dehydration. He was send home on Cymbalta, prednisone taper and opioid. He has been feeling depressed. Per daughter he has been sleeping a lot. CT scans result in suspicion for malignancy in pulmonary nodules, left renal mass, bilat adrenals, and spine (T12, L1). PMH reviewed below.    ROS: + bilat leg pain, + lower back pain, + constipation    PMH:  Past Medical History  Diagnosis Date  . CAD (coronary artery disease)     s/p cabg 3/10...s/p MI age 41  . HTN (hypertension)   . HLD (hyperlipidemia)   . Anxiety disorder   . Tremor   . GERD (gastroesophageal reflux disease)   . Pulmonary vein stenosis   . Angina   . Chronic kidney disease   . Anxiety   . Myocardial infarction 1970's; 2010  . Aspiration pneumonia     History of aspiration pneumonia with vent-dependent respiratory/notes 06/12/2008 (12/26/2012)  . Orthopnoea     "just recently" (12/26/2012)  . History of stomach ulcers     "long time ago" (12/26/2012)  . Seizures     "I've had them all my life; last one was 04/2008" (12/26/2012)  . Chronic lower  back pain   . Depression     "used to have problems w/depression" (12/26/2012)  . Fall at home 12/25/2012    "stumbled over cord while I was blowing leaves; broke my right hip" (12/26/2012)  . CVA (cerebral infarction) 01/07/2013    Acute right brain stroke  . Fracture of femoral neck, right 01/03/2013  . Diabetes mellitus type II     INSULIN DEPENDENT  . Arthritis   . Anemia   . Hyperkalemia 02/24/2014     PSH: Past Surgical History  Procedure Laterality Date  . Coronary artery bypass graft  04/2008    "CABG X3" (12/26/2012)  . Cataract extraction w/ intraocular lens  implant, bilateral Bilateral ~2004  . Hip arthroplasty Right 12/26/2012    Procedure: Right Hip Hemiarthroplasty;  Surgeon: [name], MD;  Location: [facility];  Service: Orthopedics;  Laterality: Right;  . Inguinal hernia repair Right    I have reviewed the FH and SH and  If appropriate update it with new information. No Known Allergies Scheduled Meds: . antiseptic oral rinse  7 mL Mouth Rinse BID  . carbamazepine  400 mg Oral QHS  . carvedilol  25 mg Oral BID WC  . docusate sodium  100 mg Oral BID  . DULoxetine  30 mg Oral Daily  . feeding supplement (GLUCERNA SHAKE)  237 mL Oral TID BM  . insulin aspart  0-9 Units Subcutaneous TID WC  . insulin glargine  30 Units Subcutaneous QHS  . levETIRAcetam  500 mg Oral BID  . mirtazapine  15 mg Oral QHS  . pantoprazole  40 mg Oral Daily  . polyethylene glycol  17 g Oral Daily  . simvastatin  40 mg Oral QHS  . tamsulosin  0.4 mg Oral QHS  . vitamin B-12  1,000 mcg Oral Daily   Continuous Infusions:  PRN Meds:.ipratropium-albuterol, oxyCODONE-acetaminophen, sodium chloride    BP 135/48 mmHg  Pulse 86  Temp(Src) 98.4 F (36.9 C) (Oral)  Resp 20  Ht 5' 11"  (1.803 m)  Wt 84 kg (185 lb 3 oz)  BMI 25.84 kg/m2  SpO2 95%   PPS: 20%   Intake/Output Summary (Last 24 hours) at 02/28/14 1446 Last data filed at 02/28/14 1441  Gross per 24 hour  Intake  1934.5 ml  Output      0 ml  Net 1934.5 ml   LBM: 1/12  Physical Exam:  General: NAD, lying in bed HEENT: [place]/AT, no JVD, moist mucous membranes Chest: Audible wheezing, no distress CVS: RRR Abdomen: Soft, NT, distended, +BS Ext: MAE, BLE edema Rt>Lt, warm to touch, no mottling Neuro: Arousable, confused, oriented to person  Labs: CBC    Component Value Date/Time   WBC  13.1* 02/28/2014 0510   RBC 3.49* 02/28/2014 0510   RBC 3.22* 03/17/2014 1605   HGB 9.9* 02/28/2014 0510   HCT 31.1* 02/28/2014 0510   PLT 195 02/28/2014 0510   MCV 89.1 02/28/2014 0510   MCH 28.4 02/28/2014 0510   MCHC 31.8 02/28/2014 0510   RDW 14.9 02/28/2014 0510   LYMPHSABS 2.4 03/08/2014 1052   MONOABS 1.8* 03/08/2014 1052   EOSABS 1.2* 03/07/2014 1052   BASOSABS 0.2* 02/16/2014 1052    BMET    Component Value Date/Time   NA 139 02/28/2014 0510   K 4.7 02/28/2014 0510   CL 107 02/28/2014 0510   CO2 25 02/28/2014 0510   GLUCOSE 213* 02/28/2014 0510   BUN 56* 02/28/2014 0510   CREATININE 1.33 02/28/2014 0510   CALCIUM 8.7 02/28/2014 0510   GFRNONAA 48* 02/28/2014 0510   GFRAA 55* 02/28/2014 0510    CMP     Component Value Date/Time   NA 139 02/28/2014 0510   K 4.7 02/28/2014 0510   CL 107 02/28/2014 0510   CO2 25 02/28/2014 0510   GLUCOSE 213* 02/28/2014 0510   BUN 56* 02/28/2014 0510   CREATININE 1.33 02/28/2014 0510   CALCIUM 8.7 02/28/2014 0510   PROT 5.6* 02/26/2014 1630   ALBUMIN 1.8* 02/26/2014 1630   AST 16 02/26/2014 1630   ALT 12 02/26/2014 1630   ALKPHOS 129* 02/26/2014 1630   BILITOT 0.6 02/26/2014 1630   GFRNONAA 48* 02/28/2014 0510   GFRAA 55* 02/28/2014 0510    Time In Time Out Total Time Spent with Patient Total Overall Time  1400 1520 39mn 816m    Greater than 50%  of this time was spent counseling and coordinating care related to the above assessment and plan.  AlVinie SillNP Palliative Medicine Team Pager # [phone]-F 8a-5p) Team Phone #  [phone]/Weekends)

## 2014-02-28 NOTE — Progress Notes (Signed)
PROGRESS NOTE  [name] CNO:[id number] DOB: [birth year] DOA: 02/18/2014 PCP: Woody Seller, MD  HPI/Recap of past 24 hours: Poor historian, not able to provide much history, audible wheezing during encounter. C/o chronic back pain. Oriented to place and person, not to time per daughter this is baseline.   Patient's daughter reported patient has been getting progressively weak, from being able to walk a few months ago, progressed to needing a cane, then a walker, then became wheelchair dependent recently. patient has progressive back pain was scheduled to be seen by orthopedics to discuss local injection. However, patient was found more confused and was sent to the ED.  Assessment/Plan:  Metastatic cancer -has dyspnea and occassional wheezing CT chest reveals extensive metastasis have discussed with patient and family in detail.  CT of abd/pelvis reveals left renal mass, b/l adrenal masses and bony mets - palliative care consulted- not a good candidate for further work up and chemo -May need to undergo palliative radiation if bony metastases cause significant pain  Leukocytosis -WBC count elevated at 20 on admission has not improved to 13.1 -No signs of infection noted and therefore no antibiotics given -Have been related to recent outpatient prednisone use  Back pain - noted to have sclerotic lesions where he is tender on exam- will attempt pain control    Left lower extremity edema  venous ultrasound negative  Moderate LVH -Noted on echo 1/13-study not adequate enough to determine diastolic dysfunction  Acute on chronic renal failure, stage III; -improved with hydration, ua no infection but is very concentrated with specific gravity 1.025.  Hyperkalemia;  -resolved -received IV fluids, calcium gluconate.   Normocytic Anemia:  -stool occult negative per ED. -no sign of hemolysis --s/p prbcx1.  Hold aspirin , plavix for now.   Constipation;  in setting of  opioids. resolved. fleet enema, start docusate.   Leukocytosis; probably related to recent prednisone and dehydration.  No fever, no obvious source of infection, not started on abx  Diabetes; uncontrolled. Continue with lantus but will increase to 30 units which is what he takes at home, SSI.   Depression: continue with Cymbalta, started last week. Daughter reported worsening of depression recently, patient denies SI/HI.  Code Status: full , confirmed with family and patient  Family Communication: daughter and son  Disposition Plan: remain in patient, snf vs hospice home at discharge   Consultants:  none  Procedures:  none  Antibiotics:  none   Objective: BP 138/57 mmHg  Pulse 82  Temp(Src) 97.6 F (36.4 C) (Oral)  Resp 20  Ht 5\' 11"  (1.803 m)  Wt 84 kg (185 lb 3 oz)  BMI 25.84 kg/m2  SpO2 95%  Intake/Output Summary (Last 24 hours) at 02/28/14 1322 Last data filed at 02/28/14 0800  Gross per 24 hour  Intake 1804.5 ml  Output      0 ml  Net 1804.5 ml   Filed Weights   02/21/2014 1504 02/27/14 0500 02/28/14 0532  Weight: 81.7 kg (180 lb 1.9 oz) 83 kg (182 lb 15.7 oz) 84 kg (185 lb 3 oz)    Exam:   General:  Frail/chronicaly ill apprearing  Cardiovascular: rrr  Respiratory: Mild rhonchi-no crackles or wheezing  Abdomen: soft/NT/ND/positive bs  Musculoskeletal: tenderness in mid and lower back   Data Reviewed: Basic Metabolic Panel:  Recent Labs Lab 03/13/2014 1052 03/09/2014 1605 02/26/14 0739 02/28/14 0510  NA 133* 134* 134* 139  K 5.7* 5.6* 4.9 4.7  CL 104 107 110 107  CO2 21 19 17* 25  GLUCOSE 411* 394* 259* 213*  BUN 57* 54* 53* 56*  CREATININE 1.58* 1.36* 1.34 1.33  CALCIUM 8.9 9.0 8.4 8.7   Liver Function Tests:  Recent Labs Lab 02/26/14 1630  AST 16  ALT 12  ALKPHOS 129*  BILITOT 0.6  PROT 5.6*  ALBUMIN 1.8*   No results for input(s): LIPASE, AMYLASE in the last 168 hours. No results for input(s): AMMONIA in the last 168  hours. CBC:  Recent Labs Lab 03/12/2014 1052 02/26/14 0739 02/28/14 0510  WBC 20.2* 18.2* 13.1*  NEUTROABS 14.6*  --   --   HGB 7.7* 10.9* 9.9*  HCT 23.8* 33.6* 31.1*  MCV 89.8 87.3 89.1  PLT 361 262 195   Cardiac Enzymes:   No results for input(s): CKTOTAL, CKMB, CKMBINDEX, TROPONINI in the last 168 hours. BNP (last 3 results) No results for input(s): PROBNP in the last 8760 hours. CBG:  Recent Labs Lab 02/27/14 1209 02/27/14 1654 02/27/14 2200 02/28/14 0804 02/28/14 1141  GLUCAP 258* 192* 139* 185* 150*    Recent Results (from the past 240 hour(s))  Urine culture     Status: None   Collection Time: 02/21/2014 12:44 PM  Result Value Ref Range Status   Specimen Description URINE, CATHETERIZED  Final   Special Requests NONE  Final   Colony Count NO GROWTH Performed at [facility]   Final   Culture NO GROWTH Performed at [facility]   Final   Report Status 02/26/2014 FINAL  Final     Studies: Ct Abdomen Pelvis W Contrast  02/27/2014   CLINICAL DATA:  Metastatic disease.  EXAM: CT ABDOMEN AND PELVIS WITH CONTRAST  TECHNIQUE: Multidetector CT imaging of the abdomen and pelvis was performed using the standard protocol following bolus administration of intravenous contrast.  CONTRAST:  1 OMNIPAQUE IOHEXOL 300 MG/ML SOLN, 171mL OMNIPAQUE IOHEXOL 300 MG/ML SOLN  COMPARISON:  CT chest 02/26/2014.  FINDINGS: Lower chest: Lung bases show a perilymphatic distribution of pulmonary nodules, better evaluated on yesterday's CT chest. Small bilateral effusions with compressive atelectasis in both lower lobes. Heart is at the upper limits of normal in size. No pericardial effusion.  Hepatobiliary: Low-attenuation lesions in the liver measure up to 1.6 cm in the dome. Well-circumscribed low-attenuation appearance favors cysts. Liver and gallbladder are otherwise unremarkable. No biliary ductal dilatation.  Pancreas: Negative.  Spleen: Mildly heterogeneous attenuation within  the spleen may be due to phase of contrast enhancement.  Adrenals/Urinary Tract: A heterogeneous mass in the right adrenal gland measures 2.3 x 4.2 cm. Left adrenal nodule measures 1.1 x 1.7 cm. A heterogeneous lesion in the upper pole left kidney measures 2.0 x 2.2 cm. Additional low-attenuation lesions in the kidneys are difficult to characterize due to size, lack of precontrast imaging and respiratory motion. Ureters are decompressed. Bladder is unremarkable.  Stomach/Bowel: Stomach, small bowel, appendix and colon are unremarkable.  Vascular/Lymphatic: Atherosclerotic calcification of the arterial vasculature without abdominal aortic aneurysm. No pathologically enlarged lymph nodes.  Reproductive: Prostate is relatively obscured by streak artifact from a right hip arthroplasty.  Other: No free fluid.  Mesenteries and peritoneum are unremarkable.  Musculoskeletal: There is a slightly mottled appearance of the spine and pelvis with a focally sclerotic lesion in the T12 vertebral body. Right hip arthroplasty.  IMPRESSION: 1. Heterogeneous lesion in the upper pole left kidney is poorly characterized due to respiratory motion and lack of precontrast imaging. Difficult to exclude renal cell carcinoma. Further evaluation with  pre and post contrast MRI or CT should be considered. MRI is preferred in younger patients (due to lack of ionizing radiation) and for evaluating calcified lesion(s). 2. Pulmonary metastatic disease, better evaluated on yesterday's CT chest. 3. Bilateral adrenal lesions are worrisome for metastatic disease but cannot be further characterized without precontrast imaging. These can also be further evaluated with pre and post contrast MR or CT recommended above. 4. Slightly mottled appearance spine and pelvis with focal sclerosis involving L1. Metastatic disease is considered. 5. Small bilateral effusions with compressive atelectasis in both lower lobes.   Electronically Signed   By: Lorin Picket  M.D.   On: 02/27/2014 15:09   Dg Chest Port 1 View  02/27/2014   CLINICAL DATA:  PICC line placement  EXAM: PORTABLE CHEST - 1 VIEW  COMPARISON:  None.  FINDINGS: Right-sided PICC line with the tip projecting over the SVC. Bilateral numerous pulmonary nodules. Bilateral small pleural effusions. No pneumothorax. Stable cardiomediastinal silhouette. Prior CABG. No acute osseous abnormality.  IMPRESSION: Right-sided PICC line with the tip projecting over the SVC.  Numerous bilateral pulmonary nodules most consistent with metastatic disease.   Electronically Signed   By: Kathreen Devoid   On: 02/27/2014 18:14    Scheduled Meds: . antiseptic oral rinse  7 mL Mouth Rinse BID  . carbamazepine  400 mg Oral QHS  . carvedilol  25 mg Oral BID WC  . docusate sodium  100 mg Oral BID  . DULoxetine  30 mg Oral Daily  . feeding supplement (GLUCERNA SHAKE)  237 mL Oral TID BM  . insulin aspart  0-9 Units Subcutaneous TID WC  . insulin glargine  15 Units Subcutaneous QHS  . levETIRAcetam  500 mg Oral BID  . mirtazapine  15 mg Oral QHS  . pantoprazole  40 mg Oral Daily  . polyethylene glycol  17 g Oral Daily  . simvastatin  40 mg Oral QHS  . tamsulosin  0.4 mg Oral QHS  . vitamin B-12  1,000 mcg Oral Daily    Continuous Infusions: . sodium chloride 75 mL/hr at 02/28/14 0124       [facility]  [facility] Pager-  [email or web address], password [facility] 02/28/2014, 1:22 PM  LOS: 3 days

## 2014-02-28 NOTE — Clinical Social Work Note (Signed)
Family has changed their mind on facilities and wants [facility]. [facility] states that they are now able to accept the patient. CSW has updated [facility]. [facility] is prepared to take patient Friday if he is ready for DC.   Liz Beach MSW, [place], [place], [phone]

## 2014-02-28 NOTE — Progress Notes (Signed)
Patient requesting to have CBG taken although order not in place. Will check per patient request. Patient also complaining of back pain but states "i only want to use the pain medicine as a last resort , i just want to be turned." Refusing pain medicine. Will continue to monitor patient.

## 2014-03-01 DIAGNOSIS — C801 Malignant (primary) neoplasm, unspecified: Secondary | ICD-10-CM

## 2014-03-01 LAB — CBC
HCT: 29.8 % — ABNORMAL LOW (ref 39.0–52.0)
Hemoglobin: 9.5 g/dL — ABNORMAL LOW (ref 13.0–17.0)
MCH: 28.6 pg (ref 26.0–34.0)
MCHC: 31.9 g/dL (ref 30.0–36.0)
MCV: 89.8 fL (ref 78.0–100.0)
Platelets: 165 10*3/uL (ref 150–400)
RBC: 3.32 MIL/uL — AB (ref 4.22–5.81)
RDW: 14.8 % (ref 11.5–15.5)
WBC: 13.3 10*3/uL — ABNORMAL HIGH (ref 4.0–10.5)

## 2014-03-01 LAB — BASIC METABOLIC PANEL
Anion gap: 13 (ref 5–15)
BUN: 68 mg/dL — AB (ref 6–23)
CHLORIDE: 103 meq/L (ref 96–112)
CO2: 19 mmol/L (ref 19–32)
Calcium: 8.5 mg/dL (ref 8.4–10.5)
Creatinine, Ser: 1.86 mg/dL — ABNORMAL HIGH (ref 0.50–1.35)
GFR calc Af Amer: 37 mL/min — ABNORMAL LOW (ref >90)
GFR calc non Af Amer: 32 mL/min — ABNORMAL LOW (ref >90)
Glucose, Bld: 240 mg/dL — ABNORMAL HIGH (ref 70–99)
Potassium: 5.6 mmol/L — ABNORMAL HIGH (ref 3.5–5.1)
Sodium: 135 mmol/L (ref 135–145)

## 2014-03-01 LAB — GLUCOSE, CAPILLARY
Glucose-Capillary: 224 mg/dL — ABNORMAL HIGH (ref 70–99)
Glucose-Capillary: 204 mg/dL — ABNORMAL HIGH (ref 70–99)

## 2014-03-01 MED ORDER — CARVEDILOL 6.25 MG PO TABS
6.2500 mg | ORAL_TABLET | Freq: Two times a day (BID) | ORAL | Status: DC
  Administered 2014-03-01: 6.25 mg via ORAL
  Filled 2014-03-01 (×4): qty 1

## 2014-03-01 MED ORDER — ASPIRIN 81 MG PO CHEW
81.0000 mg | CHEWABLE_TABLET | Freq: Every day | ORAL | Status: DC
  Administered 2014-03-01: 81 mg via ORAL
  Filled 2014-03-01 (×2): qty 1

## 2014-03-01 NOTE — Clinical Social Work Note (Signed)
Patient and family have decided on comfort care and are requesting [facility] CSW has made [facility] referral. Unfortunately, [facility] states that [facility] is at capacity and does not have a bed for the patient. CSW has left report for covering CSW.   Liz Beach MSW, [place], [place], [phone]

## 2014-03-01 NOTE — Clinical Social Work Note (Signed)
Clinical Social Worker continuing to follow patient and family for support and discharge planning needs.  Patient family agreeable with [facility] and [facility].  Referral placed at both locations with no bed available today.  CSW attempted to continue with SNF placement to [facility] with Hospice following, however [employer] will not cover.  [facility] hopeful for bed available tomorrow.  CSW updated patient family at bedside.  Patient family understanding and agreeable.  CSW remains available for support and to facilitate patient discharge needs.  Barbette Or, [place]

## 2014-03-01 NOTE — Progress Notes (Signed)
Nutrition Brief Note  Chart reviewed. Pt now transitioning to comfort care.  No further nutrition interventions warranted at this time.  Please re-consult as needed.   Tanay Massiah A. Zyona Pettaway, RD, LDN, CDE Pager: [phone] After hours Pager: [phone]  

## 2014-03-01 NOTE — Progress Notes (Signed)
PROGRESS NOTE  [name]:[id number] DOB: 09-16-1928 DOA: 02/19/2014 PCP: Woody Seller, MD  HPI/Recap of past 24 hours: Poor historian, not able to provide much history, audible wheezing during encounter. C/o chronic back pain. Oriented to place and person, not to time per daughter this is baseline.   Patient's daughter reported patient has been getting progressively weak, from being able to walk a few months ago, progressed to needing a cane, then a walker, then became wheelchair dependent recently. patient has progressive back pain was scheduled to be seen by orthopedics to discuss local injection. However, patient was found more confused and was sent to the ED.  Assessment/Plan:  Metastatic cancer -has dyspnea and occassional wheezing CT chest reveals extensive metastasis have discussed with patient and family in detail.  CT of abd/pelvis reveals left renal mass, b/l adrenal masses and bony mets - palliative care consulted- not a good candidate for further work up and chemo -May need to undergo palliative radiation if bony metastases cause significant pain - to go to hospice home when bed available  Leukocytosis -WBC count elevated at 20 on admission has not improved to 13.1 -No signs of infection noted and therefore no antibiotics given -Likely related to recent outpatient prednisone use  Back pain - noted to have sclerotic lesions where he is tender on exam- will attempt pain control    Left lower extremity edema  venous ultrasound negative  Moderate LVH -Noted on echo 1/13-study not adequate enough to determine diastolic dysfunction  Acute on chronic renal failure, stage III; -improved with hydration, ua no infection but is very concentrated with specific gravity 1.025.  Hyperkalemia;  -resolved -received IV fluids, calcium gluconate.   Normocytic Anemia:  -stool occult negative per ED. -no sign of hemolysis --s/p prbcx1.    Constipation;  in  setting of opioids. resolved. fleet enema, start docusate.   Leukocytosis; probably related to recent prednisone and dehydration.  No fever, no obvious source of infection, not started on abx  Diabetes; uncontrolled. Continue with lantus but will increase to 30 units which is what he takes at home, SSI.   Depression: continue with Cymbalta, started last week. Daughter reported worsening of depression recently, patient denies SI/HI.  Code Status: full , confirmed with family and patient  Family Communication: daughter and son  Disposition Plan: remain in patient, snf vs hospice home at discharge   Consultants:  none  Procedures:  none  Antibiotics:  none   Objective: BP 128/55 mmHg  Pulse 91  Temp(Src) 98.1 F (36.7 C) (Oral)  Resp 18  Ht 5\' 11"  (1.803 m)  Wt 84 kg (185 lb 3 oz)  BMI 25.84 kg/m2  SpO2 95%  Intake/Output Summary (Last 24 hours) at 03/01/14 1506 Last data filed at 03/01/14 1400  Gross per 24 hour  Intake      0 ml  Output    280 ml  Net   -280 ml   Filed Weights   02/27/2014 1504 02/27/14 0500 02/28/14 0532  Weight: 81.7 kg (180 lb 1.9 oz) 83 kg (182 lb 15.7 oz) 84 kg (185 lb 3 oz)    Exam:   General:  Frail/chronicaly ill apprearing  Cardiovascular: rrr  Respiratory: Mild rhonchi-no crackles or wheezing  Abdomen: soft/NT/ND/positive bs  Musculoskeletal: tenderness in mid and lower back   Data Reviewed: Basic Metabolic Panel:  Recent Labs Lab 02/22/2014 1052 02/24/2014 1605 02/26/14 0739 02/28/14 0510 03/01/14 0815  NA 133* 134* 134* 139 135  K 5.7*  5.6* 4.9 4.7 5.6*  CL 104 107 110 107 103  CO2 21 19 17* 25 19  GLUCOSE 411* 394* 259* 213* 240*  BUN 57* 54* 53* 56* 68*  CREATININE 1.58* 1.36* 1.34 1.33 1.86*  CALCIUM 8.9 9.0 8.4 8.7 8.5   Liver Function Tests:  Recent Labs Lab 02/26/14 1630  AST 16  ALT 12  ALKPHOS 129*  BILITOT 0.6  PROT 5.6*  ALBUMIN 1.8*   No results for input(s): LIPASE, AMYLASE in the last  168 hours. No results for input(s): AMMONIA in the last 168 hours. CBC:  Recent Labs Lab 03/16/2014 1052 02/26/14 0739 02/28/14 0510 03/01/14 0815  WBC 20.2* 18.2* 13.1* 13.3*  NEUTROABS 14.6*  --   --   --   HGB 7.7* 10.9* 9.9* 9.5*  HCT 23.8* 33.6* 31.1* 29.8*  MCV 89.8 87.3 89.1 89.8  PLT 361 262 195 165   Cardiac Enzymes:   No results for input(s): CKTOTAL, CKMB, CKMBINDEX, TROPONINI in the last 168 hours. BNP (last 3 results) No results for input(s): PROBNP in the last 8760 hours. CBG:  Recent Labs Lab 02/28/14 1141 02/28/14 1718 02/28/14 2213 03/01/14 0753 03/01/14 1158  GLUCAP 150* 291* 213* 224* 204*    Recent Results (from the past 240 hour(s))  Urine culture     Status: None   Collection Time: 03/07/2014 12:44 PM  Result Value Ref Range Status   Specimen Description URINE, CATHETERIZED  Final   Special Requests NONE  Final   Colony Count NO GROWTH Performed at [facility]   Final   Culture NO GROWTH Performed at [facility]   Final   Report Status 02/26/2014 FINAL  Final     Studies: No results found.  Scheduled Meds: . antiseptic oral rinse  7 mL Mouth Rinse BID  . carbamazepine  400 mg Oral QHS  . carvedilol  6.25 mg Oral BID WC  . docusate sodium  100 mg Oral BID  . DULoxetine  30 mg Oral Daily  . feeding supplement (GLUCERNA SHAKE)  237 mL Oral TID BM  . levETIRAcetam  500 mg Oral BID  . mirtazapine  15 mg Oral QHS  . pantoprazole  40 mg Oral Daily  . polyethylene glycol  17 g Oral Daily  . simvastatin  40 mg Oral QHS  . tamsulosin  0.4 mg Oral QHS  . vitamin B-12  1,000 mcg Oral Daily    Continuous Infusions:       [facility]  [facility] Pager-  [email or web address], password [facility] 03/01/2014, 3:06 PM  LOS: 4 days

## 2014-03-18 NOTE — Progress Notes (Signed)
PROGRESS NOTE  [name] JAS:[id number] DOB: 1928/09/21 DOA: 03/09/2014 PCP: Woody Seller, MD  HPI/Recap of past 24 hours: Poor historian, not able to provide much history, audible wheezing during encounter. C/o chronic back pain. Oriented to place and person, not to time per daughter this is baseline.   Patient's daughter reported patient has been getting progressively weak, from being able to walk a few months ago, progressed to needing a cane, then a walker, then became wheelchair dependent recently. patient has progressive back pain was scheduled to be seen by orthopedics to discuss local injection. However, patient was found more confused and was sent to the ED.  Assessment/Plan:  Metastatic cancer -has dyspnea and occassional wheezing- now becoming much more dyspneic and very confused- will need to give more Morphine to help with air hunger- discussed with daughter in detail  - initial plan was that he would go to the hospice home, however it appears now that he will expire in the next 24 hrs therefore will allow him to stay in the hospital.  - CT chest reveals extensive metastasis have discussed with patient and family in detail.  - CT of abd/pelvis reveals left renal mass, b/l adrenal masses and bony mets - palliative care consulted- not a good candidate for further work up and chemo   Leukocytosis -WBC count elevated at 20 on admission has not improved to 13.1 -No signs of infection noted and therefore no antibiotics given -Likely related to recent outpatient prednisone use  Back pain - noted to have sclerotic lesions where he is tender on exam- will attempt pain control    Left lower extremity edema  venous ultrasound negative  Moderate LVH -Noted on echo 1/13-study not adequate enough to determine diastolic dysfunction  Acute on chronic renal failure, stage III; -improved with hydration, ua no infection but is very concentrated with specific gravity  1.025.  Hyperkalemia;  -resolved -received IV fluids, calcium gluconate.   Normocytic Anemia:  -stool occult negative per ED. -no sign of hemolysis --s/p prbcx1.    Constipation;  in setting of opioids. resolved. fleet enema, start docusate.   Leukocytosis; probably related to recent prednisone and dehydration.  No fever, no obvious source of infection, not started on abx  Diabetes; uncontrolled. Continue with lantus but will increase to 30 units which is what he takes at home, SSI.   Depression: continue with Cymbalta, started last week. Daughter reported worsening of depression recently, patient denies SI/HI.  Code Status: full , confirmed with family and patient  Family Communication: daughter and son    Consultants:  none  Procedures:  none  Antibiotics:  none   Objective: BP 83/46 mmHg  Pulse 92  Temp(Src) 98.9 F (37.2 C) (Oral)  Resp 24  Ht 5\' 11"  (1.803 m)  Wt 84 kg (185 lb 3 oz)  BMI 25.84 kg/m2  SpO2 92%  Intake/Output Summary (Last 24 hours) at 03-04-2014 1144 Last data filed at 2014-03-04 0945  Gross per 24 hour  Intake     10 ml  Output    200 ml  Net   -190 ml   Filed Weights   02/23/2014 1504 02/27/14 0500 02/28/14 0532  Weight: 81.7 kg (180 lb 1.9 oz) 83 kg (182 lb 15.7 oz) 84 kg (185 lb 3 oz)    Exam:   General:  Frail/chronicaly ill apprearing- very short of breath and confused today- significant change from yesterday  Cardiovascular: rrr  Respiratory: Mild rhonchi-no crackles or wheezing  Abdomen:  soft/NT/ND/positive bs  Musculoskeletal: tenderness in mid and lower back   Data Reviewed: Basic Metabolic Panel:  Recent Labs Lab 03/12/2014 1052 03/14/2014 1605 02/26/14 0739 02/28/14 0510 03/01/14 0815  NA 133* 134* 134* 139 135  K 5.7* 5.6* 4.9 4.7 5.6*  CL 104 107 110 107 103  CO2 21 19 17* 25 19  GLUCOSE 411* 394* 259* 213* 240*  BUN 57* 54* 53* 56* 68*  CREATININE 1.58* 1.36* 1.34 1.33 1.86*  CALCIUM 8.9 9.0 8.4  8.7 8.5   Liver Function Tests:  Recent Labs Lab 02/26/14 1630  AST 16  ALT 12  ALKPHOS 129*  BILITOT 0.6  PROT 5.6*  ALBUMIN 1.8*   No results for input(s): LIPASE, AMYLASE in the last 168 hours. No results for input(s): AMMONIA in the last 168 hours. CBC:  Recent Labs Lab 03/15/2014 1052 02/26/14 0739 02/28/14 0510 03/01/14 0815  WBC 20.2* 18.2* 13.1* 13.3*  NEUTROABS 14.6*  --   --   --   HGB 7.7* 10.9* 9.9* 9.5*  HCT 23.8* 33.6* 31.1* 29.8*  MCV 89.8 87.3 89.1 89.8  PLT 361 262 195 165   Cardiac Enzymes:   No results for input(s): CKTOTAL, CKMB, CKMBINDEX, TROPONINI in the last 168 hours. BNP (last 3 results) No results for input(s): PROBNP in the last 8760 hours. CBG:  Recent Labs Lab 02/28/14 1141 02/28/14 1718 02/28/14 2213 03/01/14 0753 03/01/14 1158  GLUCAP 150* 291* 213* 224* 204*    Recent Results (from the past 240 hour(s))  Urine culture     Status: None   Collection Time: 03/04/2014 12:44 PM  Result Value Ref Range Status   Specimen Description URINE, CATHETERIZED  Final   Special Requests NONE  Final   Colony Count NO GROWTH Performed at [facility]   Final   Culture NO GROWTH Performed at [facility]   Final   Report Status 02/26/2014 FINAL  Final     Studies: No results found.  Scheduled Meds: . antiseptic oral rinse  7 mL Mouth Rinse BID  . aspirin  81 mg Oral Daily  . carbamazepine  400 mg Oral QHS  . carvedilol  6.25 mg Oral BID WC  . docusate sodium  100 mg Oral BID  . DULoxetine  30 mg Oral Daily  . feeding supplement (GLUCERNA SHAKE)  237 mL Oral TID BM  . levETIRAcetam  500 mg Oral BID  . mirtazapine  15 mg Oral QHS  . pantoprazole  40 mg Oral Daily  . polyethylene glycol  17 g Oral Daily  . simvastatin  40 mg Oral QHS  . tamsulosin  0.4 mg Oral QHS  . vitamin B-12  1,000 mcg Oral Daily    Continuous Infusions:       [facility]  [facility] Pager-  [email or web address], password  [facility] Mar 22, 2014, 11:44 AM  LOS: 5 days

## 2014-03-18 NOTE — Progress Notes (Signed)
Patient passed away at 1148. Family present in room with patient including next of kin. [place] [facility] called and funeral information obtained from family.

## 2014-03-18 NOTE — Progress Notes (Signed)
Took patient down to morgue. Postmortem checklist complete.

## 2014-03-18 NOTE — Discharge Summary (Signed)
Death Summary  [name] OFB:[id number] DOB: 1928-10-02 DOA: 03-06-14  PCP: Woody Seller, MD  Admit date: 03/06/2014 Date of Death: 11-Mar-2014  Final Diagnoses:  Principal Problem:   Metastasis of unknown origin Active Problems:   Convulsions   Type 2 diabetes mellitus with hyperosmolar nonketotic hyperglycemia   Acute renal failure   Hyperkalemia   Constipation   Lumbago   FTT (failure to thrive) in adult   Wheezing   Palliative care encounter   Back pain   Patient passed away 1148 with family present in the room. I has spoken with the patient's daughter earlier this morning in person. Please see me progress note from earlier today.   PROGRESS NOTE  [name] POE:[id number] DOB: 1928-05-21 DOA: 2014/03/06 PCP: Woody Seller, MD  HPI/Recap of past 24 hours: Poor historian, not able to provide much history, audible wheezing during encounter. C/o chronic back pain. Oriented to place and person, not to time per daughter this is baseline.   Patient's daughter reported patient has been getting progressively weak, from being able to walk a few months ago, progressed to needing a cane, then a walker, then became wheelchair dependent recently. patient has progressive back pain was scheduled to be seen by orthopedics to discuss local injection. However, patient was found more confused and was sent to the ED.  Assessment/Plan:  Metastatic cancer - CT obtained for above mentioned shortness of breath- CT of chest reveals extensive metastasis have discussed with patient and family in detail.  - CT of abd/pelvis reveals left renal mass, b/l adrenal masses and bony mets -has dyspnea and occassional wheezing- now becoming much more dyspneic and very confused- will need to give more Morphine to help with air hunger- discussed with daughter in detail  - palliative care consulted- not a good candidate for further work up and chemo   Leukocytosis -WBC count elevated at 20  on admission has not improved to 13.1 -No signs of infection noted and therefore no antibiotics given -Likely related to recent outpatient prednisone use  Back pain - noted to have sclerotic lesions where he is tender on exam- will attempt pain control   Left lower extremity edema venous ultrasound negative  Moderate LVH -Noted on echo 1/13-study not adequate enough to determine diastolic dysfunction  Acute on chronic renal failure, stage III; -improved with hydration, ua no infection but is very concentrated with specific gravity 1.025.  Hyperkalemia;  -resolved -received IV fluids, calcium gluconate.   Normocytic Anemia:  -stool occult negative per ED. -no sign of hemolysis --s/p prbcx1.   Constipation;  in setting of opioids. resolved. fleet enema, start docusate.   Leukocytosis; probably related to recent prednisone and dehydration. No fever, no obvious source of infection, not started on abx  Diabetes; uncontrolled. Continue with lantus but will increase to 30 units which is what he takes at home, SSI.   Depression: continue with Cymbalta, started last week. Daughter reported worsening of depression recently, patient denies SI/HI.  Code Status: full , confirmed with family and patient  Family Communication: daughter and son    Consultants:  none  Procedures:  none  Antibiotics:  none   Objective: BP 83/46 mmHg  Pulse 92  Temp(Src) 98.9 F (37.2 C) (Oral)  Resp 24  Ht 5\' 11"  (1.803 m)  Wt 84 kg (185 lb 3 oz)  BMI 25.84 kg/m2  SpO2 92%  Intake/Output Summary (Last 24 hours) at 2014-03-11 1144 Last data filed at 11-Mar-2014 0945  Gross per 24  hour  Intake  10 ml  Output  200 ml  Net  -190 ml   Filed Weights   03/09/2014 1504 02/27/14 0500 02/28/14 0532  Weight: 81.7 kg (180 lb 1.9 oz) 83 kg (182 lb 15.7 oz) 84 kg (185 lb 3 oz)    Exam:   General: Frail/chronicaly ill apprearing- very short of breath and  confused today- significant change from yesterday  Cardiovascular: rrr  Respiratory: Mild rhonchi-no crackles or wheezing  Abdomen: soft/NT/ND/positive bs  Musculoskeletal: tenderness in mid and lower back   Data Reviewed: Basic Metabolic Panel:  Last Labs      Recent Labs Lab 02/17/2014 1052 02/23/2014 1605 02/26/14 0739 02/28/14 0510 03/01/14 0815  NA 133* 134* 134* 139 135  K 5.7* 5.6* 4.9 4.7 5.6*  CL 104 107 110 107 103  CO2 21 19 17* 25 19  GLUCOSE 411* 394* 259* 213* 240*  BUN 57* 54* 53* 56* 68*  CREATININE 1.58* 1.36* 1.34 1.33 1.86*  CALCIUM 8.9 9.0 8.4 8.7 8.5     Liver Function Tests:  Last Labs      Recent Labs Lab 02/26/14 1630  AST 16  ALT 12  ALKPHOS 129*  BILITOT 0.6  PROT 5.6*  ALBUMIN 1.8*      Last Labs     No results for input(s): LIPASE, AMYLASE in the last 168 hours.    Last Labs     No results for input(s): AMMONIA in the last 168 hours.   CBC:  Last Labs      Recent Labs Lab 03/03/2014 1052 02/26/14 0739 02/28/14 0510 03/01/14 0815  WBC 20.2* 18.2* 13.1* 13.3*  NEUTROABS 14.6* --  --  --   HGB 7.7* 10.9* 9.9* 9.5*  HCT 23.8* 33.6* 31.1* 29.8*  MCV 89.8 87.3 89.1 89.8  PLT 361 262 195 165     Cardiac Enzymes:   Last Labs     No results for input(s): CKTOTAL, CKMB, CKMBINDEX, TROPONINI in the last 168 hours.   BNP (last 3 results)  Recent Labs (within last 365 days)    No results for input(s): PROBNP in the last 8760 hours.   CBG:  Last Labs      Recent Labs Lab 02/28/14 1141 02/28/14 1718 02/28/14 2213 03/01/14 0753 03/01/14 1158  GLUCAP 150* 291* 213* 224* 204*      Recent Results (from the past 240 hour(s))  Urine culture Status: None   Collection Time: 03/01/2014 12:44 PM  Result Value Ref Range Status   Specimen Description URINE, CATHETERIZED  Final   Special  Requests NONE  Final   Colony Count NO GROWTH Performed at [facility]   Final   Culture NO GROWTH Performed at [facility]   Final   Report Status 02/26/2014 FINAL  Final     Studies: No results found.  Scheduled Meds: . antiseptic oral rinse 7 mL Mouth Rinse BID  . aspirin 81 mg Oral Daily  . carbamazepine 400 mg Oral QHS  . carvedilol 6.25 mg Oral BID WC  . docusate sodium 100 mg Oral BID  . DULoxetine 30 mg Oral Daily  . feeding supplement (GLUCERNA SHAKE) 237 mL Oral TID BM  . levETIRAcetam 500 mg Oral BID  . mirtazapine 15 mg Oral QHS  . pantoprazole 40 mg Oral Daily  . polyethylene glycol 17 g Oral Daily  . simvastatin 40 mg Oral QHS  . tamsulosin 0.4 mg Oral QHS  . vitamin B-12 1,000 mcg Oral Daily  Continuous Infusions:       [facility] Pager- [email or web address], password [facility] 03/05/14, 11:44 AM  LOS: 5 days                           Signed:  [facility]  [facility] March 05, 2014, 2:42 PM

## 2014-03-18 DEATH — deceased

## 2015-06-13 IMAGING — CR DG CHEST 1V PORT
1 series · 1 of 1 positions shown · non-contrast
Comparison: 08/26/2011.

CLINICAL DATA: Pre hip surgery evaluation.

EXAM:
PORTABLE CHEST - 1 VIEW

[AP]
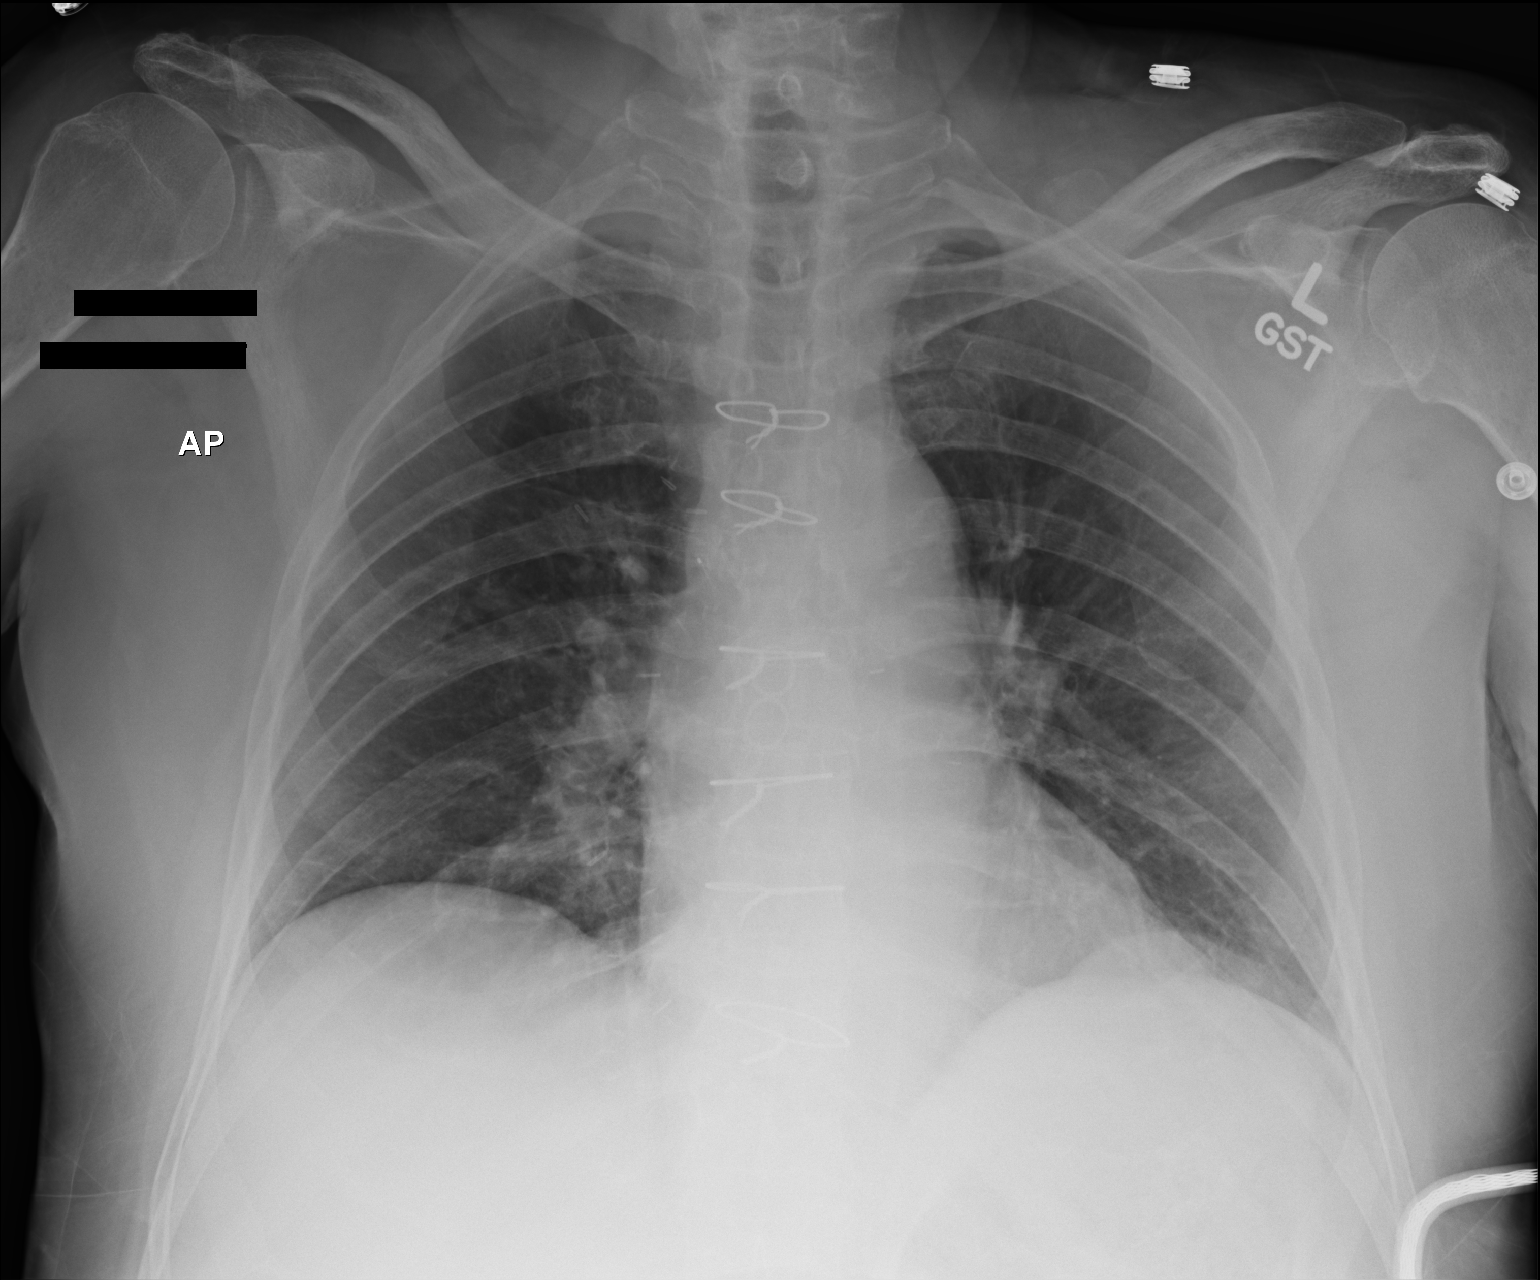

[1 of 1 positions shown; findings below may reference images not displayed]

FINDINGS: Normal sized heart. Clear lungs with normal vascularity. Post CABG
changes. Unremarkable bones.
IMPRESSION: No acute abnormality.
